# Patient Record
Sex: Female | Born: 1970 | Race: White | Hispanic: No | Marital: Married | Smoking: Former smoker
Health system: Southern US, Community
[De-identification: ages and names within clinical notes are randomized; demographics above are authoritative.]

## PROBLEM LIST (undated history)

## (undated) DIAGNOSIS — G43909 Migraine, unspecified, not intractable, without status migrainosus: Secondary | ICD-10-CM

## (undated) DIAGNOSIS — K219 Gastro-esophageal reflux disease without esophagitis: Secondary | ICD-10-CM

## (undated) DIAGNOSIS — F329 Major depressive disorder, single episode, unspecified: Secondary | ICD-10-CM

## (undated) DIAGNOSIS — L508 Other urticaria: Secondary | ICD-10-CM

## (undated) DIAGNOSIS — K589 Irritable bowel syndrome without diarrhea: Secondary | ICD-10-CM

## (undated) DIAGNOSIS — F419 Anxiety disorder, unspecified: Secondary | ICD-10-CM

## (undated) DIAGNOSIS — M797 Fibromyalgia: Secondary | ICD-10-CM

## (undated) DIAGNOSIS — K2970 Gastritis, unspecified, without bleeding: Secondary | ICD-10-CM

## (undated) DIAGNOSIS — T7840XA Allergy, unspecified, initial encounter: Secondary | ICD-10-CM

## (undated) DIAGNOSIS — F32A Depression, unspecified: Secondary | ICD-10-CM

## (undated) DIAGNOSIS — R42 Dizziness and giddiness: Secondary | ICD-10-CM

## (undated) HISTORY — DX: Dizziness and giddiness: R42

## (undated) HISTORY — PX: DILATION AND CURETTAGE OF UTERUS: SHX78

## (undated) HISTORY — DX: Migraine, unspecified, not intractable, without status migrainosus: G43.909

## (undated) HISTORY — DX: Gastritis, unspecified, without bleeding: K29.70

## (undated) HISTORY — DX: Depression, unspecified: F32.A

## (undated) HISTORY — DX: Irritable bowel syndrome, unspecified: K58.9

## (undated) HISTORY — PX: DIAGNOSTIC LAPAROSCOPY: SUR761

## (undated) HISTORY — PX: EYE SURGERY: SHX253

## (undated) HISTORY — DX: Allergy, unspecified, initial encounter: T78.40XA

## (undated) HISTORY — DX: Other urticaria: L50.8

## (undated) HISTORY — PX: WISDOM TOOTH EXTRACTION: SHX21

## (undated) HISTORY — DX: Major depressive disorder, single episode, unspecified: F32.9

---

## 1997-09-05 ENCOUNTER — Other Ambulatory Visit: Admission: RE | Admit: 1997-09-05 | Discharge: 1997-09-05 | Payer: Self-pay | Admitting: Obstetrics and Gynecology

## 1999-03-02 ENCOUNTER — Other Ambulatory Visit: Admission: RE | Admit: 1999-03-02 | Discharge: 1999-03-02 | Payer: Self-pay | Admitting: *Deleted

## 1999-10-29 ENCOUNTER — Ambulatory Visit (HOSPITAL_COMMUNITY): Admission: RE | Admit: 1999-10-29 | Discharge: 1999-10-29 | Payer: Self-pay | Admitting: Obstetrics and Gynecology

## 2000-12-05 ENCOUNTER — Other Ambulatory Visit: Admission: RE | Admit: 2000-12-05 | Discharge: 2000-12-05 | Payer: Self-pay | Admitting: Obstetrics and Gynecology

## 2004-05-06 ENCOUNTER — Ambulatory Visit (HOSPITAL_COMMUNITY): Admission: RE | Admit: 2004-05-06 | Discharge: 2004-05-06 | Payer: Self-pay | Admitting: Otolaryngology

## 2005-12-16 ENCOUNTER — Ambulatory Visit (HOSPITAL_COMMUNITY): Admission: RE | Admit: 2005-12-16 | Discharge: 2005-12-16 | Payer: Self-pay | Admitting: Family Medicine

## 2006-01-03 ENCOUNTER — Ambulatory Visit: Payer: Self-pay | Admitting: Gastroenterology

## 2006-01-03 LAB — CONVERTED CEMR LAB
Bilirubin Urine: NEGATIVE
Crystals: NEGATIVE
Folate: 18.8 ng/mL
Ketones, ur: NEGATIVE mg/dL
Mucus, UA: NEGATIVE
Nitrite: NEGATIVE
Sed Rate: 13 mm/hr (ref 0–25)
Specific Gravity, Urine: 1.015 (ref 1.000–1.03)
Total Protein, Urine: NEGATIVE mg/dL
Urine Glucose: NEGATIVE mg/dL
Urobilinogen, UA: 0.2 (ref 0.0–1.0)
Vitamin B-12: 534 pg/mL (ref 211–911)
pH: 7 (ref 5.0–8.0)

## 2006-01-06 ENCOUNTER — Ambulatory Visit: Payer: Self-pay | Admitting: Gastroenterology

## 2006-01-17 ENCOUNTER — Ambulatory Visit: Payer: Self-pay | Admitting: Gastroenterology

## 2006-02-07 HISTORY — PX: COLONOSCOPY: SHX174

## 2006-02-21 ENCOUNTER — Ambulatory Visit: Payer: Self-pay | Admitting: Gastroenterology

## 2012-06-25 ENCOUNTER — Other Ambulatory Visit: Payer: Self-pay | Admitting: Family Medicine

## 2012-06-26 NOTE — Telephone Encounter (Signed)
Ok times 2 total 

## 2012-06-28 NOTE — Addendum Note (Signed)
Addended by: Metro Kung on: 06/28/2012 06:15 PM   Modules accepted: Orders

## 2012-09-03 ENCOUNTER — Telehealth: Payer: Self-pay | Admitting: Family Medicine

## 2012-09-03 NOTE — Telephone Encounter (Signed)
Patient needs medication refill for restless legs.  States it started with PRAM...Marland KitchenMarland KitchenMarland Kitchen  Also states [facility] was to call this refill in to Doctor on Monday (July 14, 20104) of last week.  [facility] [employer]

## 2012-09-03 NOTE — Telephone Encounter (Signed)
Refill her mirapex at same dose with 6 ref

## 2012-09-04 MED ORDER — PRAMIPEXOLE DIHYDROCHLORIDE 0.25 MG PO TABS: 0.1250 mg | ORAL_TABLET | Freq: Every day | ORAL | Status: DC

## 2012-09-04 NOTE — Telephone Encounter (Signed)
[facility] phones are down and patient not sure of dose. Need directions so i can e-scribe please.

## 2012-09-04 NOTE — Telephone Encounter (Signed)
Patient was able to locate RX and give directions. Med sent electronically to [facility]. Patient notified.

## 2012-09-04 NOTE — Telephone Encounter (Signed)
Wow. Check medicine list on chart. If not clear, will have to wait until [facility] phones are working

## 2012-09-04 NOTE — Addendum Note (Signed)
Addended by: Margaretha Sheffield on: 09/04/2012 09:28 AM   Modules accepted: Orders

## 2012-09-14 ENCOUNTER — Ambulatory Visit (INDEPENDENT_AMBULATORY_CARE_PROVIDER_SITE_OTHER): Payer: BC Managed Care – PPO | Admitting: Nurse Practitioner

## 2012-09-14 ENCOUNTER — Encounter: Payer: Self-pay | Admitting: Nurse Practitioner

## 2012-09-14 VITALS — BP 106/72 | Temp 98.3°F | Ht 67.0 in | Wt 192.2 lb

## 2012-09-14 DIAGNOSIS — F418 Other specified anxiety disorders: Secondary | ICD-10-CM

## 2012-09-14 DIAGNOSIS — F341 Dysthymic disorder: Secondary | ICD-10-CM

## 2012-09-14 DIAGNOSIS — D649 Anemia, unspecified: Secondary | ICD-10-CM

## 2012-09-14 DIAGNOSIS — M2550 Pain in unspecified joint: Secondary | ICD-10-CM

## 2012-09-14 DIAGNOSIS — IMO0001 Reserved for inherently not codable concepts without codable children: Secondary | ICD-10-CM

## 2012-09-14 DIAGNOSIS — G43009 Migraine without aura, not intractable, without status migrainosus: Secondary | ICD-10-CM

## 2012-09-14 DIAGNOSIS — R5383 Other fatigue: Secondary | ICD-10-CM

## 2012-09-14 DIAGNOSIS — R5381 Other malaise: Secondary | ICD-10-CM

## 2012-09-14 MED ORDER — TRAZODONE HCL 50 MG PO TABS: ORAL_TABLET | ORAL | Status: DC

## 2012-09-15 LAB — CBC WITH DIFFERENTIAL/PLATELET
Basophils Absolute: 0 10*3/uL (ref 0.0–0.1)
Basophils Relative: 0 % (ref 0–1)
Eosinophils Absolute: 0.2 10*3/uL (ref 0.0–0.7)
Eosinophils Relative: 2 % (ref 0–5)
HCT: 43.2 % (ref 36.0–46.0)
Hemoglobin: 15.1 g/dL — ABNORMAL HIGH (ref 12.0–15.0)
Lymphocytes Relative: 21 % (ref 12–46)
Lymphs Abs: 1.8 10*3/uL (ref 0.7–4.0)
MCH: 29.5 pg (ref 26.0–34.0)
MCHC: 35 g/dL (ref 30.0–36.0)
MCV: 84.5 fL (ref 78.0–100.0)
Monocytes Absolute: 0.6 10*3/uL (ref 0.1–1.0)
Monocytes Relative: 7 % (ref 3–12)
Neutro Abs: 6 10*3/uL (ref 1.7–7.7)
Neutrophils Relative %: 70 % (ref 43–77)
Platelets: 273 10*3/uL (ref 150–400)
RBC: 5.11 MIL/uL (ref 3.87–5.11)
RDW: 13.2 % (ref 11.5–15.5)
WBC: 8.5 10*3/uL (ref 4.0–10.5)

## 2012-09-15 LAB — RHEUMATOID FACTOR: Rhuematoid fact SerPl-aCnc: <10 IU/mL (ref <=14)

## 2012-09-15 LAB — FERRITIN: Ferritin: 25 ng/mL (ref 10–291)

## 2012-09-15 LAB — SEDIMENTATION RATE: Sed Rate: 6 mm/hr (ref 0–22)

## 2012-09-17 ENCOUNTER — Encounter: Payer: Self-pay | Admitting: Nurse Practitioner

## 2012-09-17 DIAGNOSIS — G43009 Migraine without aura, not intractable, without status migrainosus: Secondary | ICD-10-CM | POA: Insufficient documentation

## 2012-09-17 DIAGNOSIS — F418 Other specified anxiety disorders: Secondary | ICD-10-CM | POA: Insufficient documentation

## 2012-09-17 LAB — B. BURGDORFI ANTIBODIES: B burgdorferi Ab IgG+IgM: 0.45 {ISR}

## 2012-09-17 LAB — ANA: Anti Nuclear Antibody(ANA): NEGATIVE

## 2012-09-17 NOTE — Assessment & Plan Note (Signed)
Continue Prozac as directed. Trazodone added to her regimen to assist with sleep.

## 2012-09-17 NOTE — Assessment & Plan Note (Signed)
Continue Relpax as directed. To improve her sleep, start trazodone 50 mg half tab by mouth daily x6 days then 1 by mouth daily.

## 2012-09-17 NOTE — Progress Notes (Signed)
Subjective:  Presents complaints of an increase in her migraine headaches over the past month. Having them 3 days of the week. Some relief with Relpax. No change in symptomatology. Worsening fatigue over the past 2 months. No fevers. Generalized achiness upper and lower body both sides. Elbow and knee pain. No erythema edema or warmth. No history of tick bites. Has been under tremendous amounts of stress. Some sleep disturbance. Emotional lability. Has a history of anemia.  Objective:   BP 106/72  Temp(Src) 98.3 F (36.8 C) (Oral)  Ht 5\' 7"  (1.702 m)  Wt 192 lb 3.2 oz (87.181 kg)  BMI 30.1 kg/m2 NAD. Alert, oriented. Mildly fatigued in appearance. Thyroid normal limit to palpation and nontender. Lungs clear. Heart regular rate rhythm. Positive tenderness with palpation of the muscles upper and lower body bilateral. Joints normal limit in appearance, no specific tenderness.  Assessment:Arthralgia - Plan: Antinuclear Antib (ANA), Rheumatoid factor, Sedimentation rate, B. burgdorfi antibodies  Myalgia and myositis - Plan: Sedimentation rate  Fatigue - Plan: CBC with Differential, Antinuclear Antib (ANA), Rheumatoid factor, Sedimentation rate, B. burgdorfi antibodies  Anemia - Plan: CBC with Differential, Ferritin  Probable fibromyalgia Insomnia  Plan: Start trazodone 50 mg a half tab by mouth each bedtime for 6 days and if needed increase to one tab by mouth each bedtime. Cautioned about potential adverse effects. Discussed importance of adequate rest nutrition and stress reduction. Further followup based on test results.

## 2012-10-06 ENCOUNTER — Encounter: Payer: Self-pay | Admitting: *Deleted

## 2012-10-11 ENCOUNTER — Ambulatory Visit (INDEPENDENT_AMBULATORY_CARE_PROVIDER_SITE_OTHER): Payer: BC Managed Care – PPO | Admitting: Nurse Practitioner

## 2012-10-11 ENCOUNTER — Encounter: Payer: Self-pay | Admitting: Nurse Practitioner

## 2012-10-11 VITALS — BP 128/78 | Ht 67.0 in | Wt 192.8 lb

## 2012-10-11 DIAGNOSIS — F418 Other specified anxiety disorders: Secondary | ICD-10-CM

## 2012-10-11 DIAGNOSIS — M797 Fibromyalgia: Secondary | ICD-10-CM

## 2012-10-11 DIAGNOSIS — F341 Dysthymic disorder: Secondary | ICD-10-CM

## 2012-10-11 DIAGNOSIS — IMO0001 Reserved for inherently not codable concepts without codable children: Secondary | ICD-10-CM

## 2012-10-11 MED ORDER — DULOXETINE HCL 30 MG PO CPEP: 30.0000 mg | ORAL_CAPSULE | Freq: Every day | ORAL | Status: DC

## 2012-10-11 MED ORDER — LORAZEPAM 1 MG PO TABS: 1.0000 mg | ORAL_TABLET | Freq: Two times a day (BID) | ORAL | Status: DC | PRN

## 2012-10-11 NOTE — Patient Instructions (Signed)
Acidophilus Vaginal probiotics Vitamin C

## 2012-10-12 ENCOUNTER — Encounter: Payer: Self-pay | Admitting: Nurse Practitioner

## 2012-10-12 DIAGNOSIS — M797 Fibromyalgia: Secondary | ICD-10-CM | POA: Insufficient documentation

## 2012-10-12 NOTE — Assessment & Plan Note (Signed)
.   DULoxetine (CYMBALTA) 30 MG capsule    Sig: Take 1 capsule (30 mg total) by mouth daily.    Dispense:  30 capsule    Refill:  2    Order Specific Question:  Supervising Provider    Answer:  LUKING, WILLIAM S [[id number]]  . LORazepam (ATIVAN) 1 MG tablet    Sig: Take 1 tablet (1 mg total) by mouth 2 (two) times daily as needed for anxiety.    Dispense:  30 tablet    Refill:  2    Order Specific Question:  Supervising Provider    Answer:  LUKING, WILLIAM S [[id number]]   Stop Prozac 20 mg. Switch to Cymbalta. Lorazepam a half to one tab by mouth twice a day when necessary. Cautioned about drowsiness. Call back if no improvement. Otherwise routine followup in 3 months. Discussed importance of stress reduction and regular activity.  

## 2012-10-12 NOTE — Assessment & Plan Note (Signed)
.   DULoxetine (CYMBALTA) 30 MG capsule    Sig: Take 1 capsule (30 mg total) by mouth daily.    Dispense:  30 capsule    Refill:  2    Order Specific Question:  Supervising Provider    Answer:  Merlyn Albert [[id number]]  . LORazepam (ATIVAN) 1 MG tablet    Sig: Take 1 tablet (1 mg total) by mouth 2 (two) times daily as needed for anxiety.    Dispense:  30 tablet    Refill:  2    Order Specific Question:  Supervising Provider    Answer:  Merlyn Albert [[id number]]   Stop Prozac 20 mg. Switch to Cymbalta. Lorazepam a half to one tab by mouth twice a day when necessary. Cautioned about drowsiness. Call back if no improvement. Otherwise routine followup in 3 months. Discussed importance of stress reduction and regular activity.

## 2012-10-12 NOTE — Progress Notes (Signed)
Subjective:  Presents for followup. Continues to have generalized achiness. Recent lab work was normal. Trazodone 25-50 mg works well for sleep but remains drowsy the next morning. No fever. Rare diazepam use only for severe vertigo. Has been under increased stress lately, has taken lorazepam without difficulty in the past, works well for her anxiety.  Objective:   BP 128/78  Ht 5\' 7"  (1.702 m)  Wt 192 lb 12.8 oz (87.454 kg)  BMI 30.19 kg/m2 NAD. Alert, oriented. Lungs clear. Heart regular rate rhythm.  Assessment: Plan: Meds ordered this encounter  Medications  . DULoxetine (CYMBALTA) 30 MG capsule    Sig: Take 1 capsule (30 mg total) by mouth daily.    Dispense:  30 capsule    Refill:  2    Order Specific Question:  Supervising Provider    Answer:  Merlyn Albert [[id number]]  . LORazepam (ATIVAN) 1 MG tablet    Sig: Take 1 tablet (1 mg total) by mouth 2 (two) times daily as needed for anxiety.    Dispense:  30 tablet    Refill:  2    Order Specific Question:  Supervising Provider    Answer:  Merlyn Albert [[id number]]   Stop Prozac 20 mg. Switch to Cymbalta. Lorazepam a half to one tab by mouth twice a day when necessary. Cautioned about drowsiness. Call back if no improvement. Otherwise routine followup in 3 months. Discussed importance of stress reduction and regular activity.

## 2012-10-26 ENCOUNTER — Other Ambulatory Visit: Payer: Self-pay | Admitting: Nurse Practitioner

## 2012-12-19 ENCOUNTER — Other Ambulatory Visit: Payer: Self-pay | Admitting: Family Medicine

## 2012-12-20 ENCOUNTER — Other Ambulatory Visit: Payer: Self-pay | Admitting: Nurse Practitioner

## 2012-12-20 ENCOUNTER — Encounter: Payer: Self-pay | Admitting: Nurse Practitioner

## 2012-12-20 ENCOUNTER — Ambulatory Visit (INDEPENDENT_AMBULATORY_CARE_PROVIDER_SITE_OTHER): Payer: BC Managed Care – PPO | Admitting: Nurse Practitioner

## 2012-12-20 VITALS — BP 108/70 | Ht 67.0 in | Wt 197.4 lb

## 2012-12-20 DIAGNOSIS — IMO0001 Reserved for inherently not codable concepts without codable children: Secondary | ICD-10-CM

## 2012-12-20 DIAGNOSIS — F341 Dysthymic disorder: Secondary | ICD-10-CM

## 2012-12-20 DIAGNOSIS — F418 Other specified anxiety disorders: Secondary | ICD-10-CM

## 2012-12-20 DIAGNOSIS — M797 Fibromyalgia: Secondary | ICD-10-CM

## 2012-12-20 MED ORDER — TERCONAZOLE 0.4 % VA CREA: 1.0000 | TOPICAL_CREAM | Freq: Every day | VAGINAL | Status: DC

## 2012-12-20 MED ORDER — DULOXETINE HCL 60 MG PO CPEP: 60.0000 mg | ORAL_CAPSULE | Freq: Every day | ORAL | Status: DC

## 2012-12-20 NOTE — Telephone Encounter (Signed)
Okay times 4 each

## 2012-12-20 NOTE — Patient Instructions (Addendum)
Light [email or web address] Seasonal affective disorder B vitamin complex with selenium Dr. Theola Sequin podiatrist

## 2012-12-24 ENCOUNTER — Encounter: Payer: Self-pay | Admitting: Nurse Practitioner

## 2012-12-24 NOTE — Progress Notes (Signed)
Subjective:  Presents for several issues. Has had history of plantar fasciitis for several months, has seen podiatrist for this. Recommend that she followup with podiatrist. Also complaints of depression over the past month and a half. Seems to be slightly worse. Fibromyalgia has improved with Cymbalta. Decreased interest in general, increased appetite with emotional eating. Some problems sleeping. Denies suicidal thoughts or ideation. There may also be some element of seasonal effective disorder.  Objective:   BP 108/70  Ht 5\' 7"  (1.702 m)  Wt 197 lb 6.4 oz (89.54 kg)  BMI 30.91 kg/m2 NAD. Alert, oriented. Thoughts logical coherent and relevant. Dressed properly. Cheerful affect. Lungs clear. Heart regular rate rhythm.  Assessment:Depression with anxiety  Fibromyalgia  Plan: Meds ordered this encounter  Medications  . DULoxetine (CYMBALTA) 60 MG capsule    Sig: Take 1 capsule (60 mg total) by mouth daily.    Dispense:  30 capsule    Refill:  2    Order Specific Question:  Supervising Provider    Answer:  Merlyn Albert [[id number]]  . terconazole (TERAZOL 7) 0.4 % vaginal cream    Sig: Place 1 applicator vaginally at bedtime. X 7 d    Dispense:  45 g    Refill:  0    Order Specific Question:  Supervising Provider    Answer:  Merlyn Albert [[id number]]   Increase Cymbalta to 60 mg daily. Call back if any problems with new dosage. Recheck in 3 months, call back sooner if no improvement in her depression. Patient to seek help immediately if any suicidal thoughts or ideation. Light therapy [email or web address] Seasonal affective disorder B vitamin complex with selenium Dr. Theola Sequin podiatrist

## 2012-12-24 NOTE — Assessment & Plan Note (Signed)
Increase Cymbalta to 60 mg daily. Call back if any problems with new dosage. Recheck in 3 months, call back sooner if no improvement in her depression. Patient to seek help immediately if any suicidal thoughts or ideation.

## 2012-12-24 NOTE — Assessment & Plan Note (Signed)
Increase Cymbalta to 60 mg daily. Call back if any problems with new dosage. Recheck in 3 months, call back sooner if no improvement in her depression. Patient to seek help immediately if any suicidal thoughts or ideation. 

## 2013-01-02 ENCOUNTER — Other Ambulatory Visit: Payer: Self-pay | Admitting: Family Medicine

## 2013-01-02 NOTE — Telephone Encounter (Signed)
There is some drug interaction between this and her antidepressant. Dr. Brett Canales is to decide if it is more beneficial than risky. Please send requests to him.

## 2013-01-04 NOTE — Telephone Encounter (Signed)
May do 5 ref can take with antidepr

## 2013-01-11 ENCOUNTER — Telehealth: Payer: Self-pay | Admitting: Family Medicine

## 2013-01-11 MED ORDER — FLUCONAZOLE 150 MG PO TABS: ORAL_TABLET | ORAL | Status: DC

## 2013-01-11 NOTE — Telephone Encounter (Signed)
difl 150 one po three d apart numb 2

## 2013-01-11 NOTE — Telephone Encounter (Signed)
Med sent to pharm. Pt notified.  

## 2013-01-11 NOTE — Telephone Encounter (Signed)
Patient needs Rx for diflucan for vaginal issues  [facility]

## 2013-01-24 ENCOUNTER — Other Ambulatory Visit: Payer: Self-pay | Admitting: Family Medicine

## 2013-02-18 ENCOUNTER — Ambulatory Visit: Payer: BC Managed Care – PPO | Admitting: Nurse Practitioner

## 2013-02-19 ENCOUNTER — Ambulatory Visit: Payer: BC Managed Care – PPO | Admitting: Family Medicine

## 2013-02-27 ENCOUNTER — Encounter: Payer: Self-pay | Admitting: Nurse Practitioner

## 2013-02-27 ENCOUNTER — Ambulatory Visit (INDEPENDENT_AMBULATORY_CARE_PROVIDER_SITE_OTHER): Payer: BC Managed Care – PPO | Admitting: Nurse Practitioner

## 2013-02-27 VITALS — BP 130/80 | Ht 67.0 in | Wt 196.6 lb

## 2013-02-27 DIAGNOSIS — N920 Excessive and frequent menstruation with regular cycle: Secondary | ICD-10-CM

## 2013-02-27 DIAGNOSIS — G43009 Migraine without aura, not intractable, without status migrainosus: Secondary | ICD-10-CM

## 2013-02-27 DIAGNOSIS — F418 Other specified anxiety disorders: Secondary | ICD-10-CM

## 2013-02-27 DIAGNOSIS — F341 Dysthymic disorder: Secondary | ICD-10-CM

## 2013-02-27 LAB — POCT HEMOGLOBIN: Hemoglobin: 14.1 g/dL (ref 12.2–16.2)

## 2013-02-27 MED ORDER — NYSTATIN 100000 UNIT/GM EX CREA: 1.0000 "application " | TOPICAL_CREAM | Freq: Two times a day (BID) | CUTANEOUS | Status: DC

## 2013-02-27 MED ORDER — GABAPENTIN 600 MG PO TABS: 600.0000 mg | ORAL_TABLET | Freq: Two times a day (BID) | ORAL | Status: DC

## 2013-02-27 NOTE — Patient Instructions (Addendum)
nexplanon mirena Endometrial ablation micronor

## 2013-02-28 ENCOUNTER — Encounter: Payer: Self-pay | Admitting: Nurse Practitioner

## 2013-02-28 NOTE — Progress Notes (Signed)
Subjective:  Presents for routine followup. Has a DMV form with her today to be filled out during office visit. The only section that needs to be filled out as far emotional/mental. Patient has been stable for a long time. 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 Continues to have frequent migraines about 3-4 times per week. Sees her gynecologist for her physicals. Menses regular for 2-3 months and then irregular lasting 7-9 days with very heavy flow the first 3-4 days. Nonsmoker. Also slight crackles reoccurring in the corners of her mouth. Has been prescribed Mycolog cream which she uses occasionally.  Objective:   BP 130/80  Ht 5\' 7"  (1.702 m)  Wt 196 lb 9.6 oz (89.177 kg)  BMI 30.78 kg/m2 NAD. Alert, oriented. Calm affect. Lungs clear. Heart regular rate rhythm. Faint fissures noted in the corners of the mouth, nonerythematous.  Assessment:Menorrhagia - Plan: POCT hemoglobin  Migraine headache without aura  Depression with anxiety  angular chelitis  Plan: Meds ordered this encounter  Medications  . nystatin cream (MYCOSTATIN)    Sig: Apply 1 application topically 2 (two) times daily.    Dispense:  30 g    Refill:  0    Order Specific Question:  Supervising Provider    Answer:  Merlyn AlbertLUKING, WILLIAM S [[id number]]  . gabapentin (NEURONTIN) 600 MG tablet    Sig: Take 1 tablet (600 mg total) by mouth 2 (two) times daily.    Dispense:  60 tablet    Refill:  5    Order Specific Question:  Supervising Provider    Answer:  Merlyn AlbertLUKING, WILLIAM S [[id number]]   switch to plain nystatin cream which does not have triamcinolone, may use as needed. Increase Neurontin dose to twice a day to see if this will help her headaches. Strongly recommended patient to consult with gynecology regarding her cycles to see if this will help with migraines. DMV form filled out while patient was in office. Recheck in 3 months, call back sooner if needed.

## 2013-02-28 NOTE — Assessment & Plan Note (Signed)
.   gabapentin (NEURONTIN) 600 MG tablet    Sig: Take 1 tablet (600 mg total) by mouth 2 (two) times daily.    Dispense:  60 tablet    Refill:  5    Order Specific Question:  Supervising Provider    Answer:  LUKING, WILLIAM S [[id number]]   switch to plain nystatin cream which does not have triamcinolone, may use as needed. Increase Neurontin dose to twice a day to see if this will help her headaches. Strongly recommended patient to consult with gynecology regarding her cycles to see if this will help with migraines. DMV form filled out while patient was in office. Recheck in 3 months, call back sooner if needed. 

## 2013-02-28 NOTE — Assessment & Plan Note (Signed)
.   gabapentin (NEURONTIN) 600 MG tablet    Sig: Take 1 tablet (600 mg total) by mouth 2 (two) times daily.    Dispense:  60 tablet    Refill:  5    Order Specific Question:  Supervising Provider    Answer:  Merlyn AlbertLUKING, WILLIAM S [[id number]]   switch to plain nystatin cream which does not have triamcinolone, may use as needed. Increase Neurontin dose to twice a day to see if this will help her headaches. Strongly recommended patient to consult with gynecology regarding her cycles to see if this will help with migraines. DMV form filled out while patient was in office. Recheck in 3 months, call back sooner if needed.

## 2013-04-08 ENCOUNTER — Other Ambulatory Visit: Payer: Self-pay | Admitting: Family Medicine

## 2013-04-15 ENCOUNTER — Telehealth: Payer: Self-pay | Admitting: Family Medicine

## 2013-04-15 NOTE — Telephone Encounter (Signed)
Pt states her migraine medication is not working, wants to know if you can change it  Its not phasing it at all even with 2 a day

## 2013-04-17 NOTE — Telephone Encounter (Signed)
3 ideas: #1. Increase Neurontin to TID  2. Add Depakote and stop Neurontin; this is an anticonvulsant similar to Topamax 3. Stop Neurontin and add beta blocker (a BP med commonly used for migraines)

## 2013-04-17 NOTE — Telephone Encounter (Signed)
Left message on voicemail to return call.

## 2013-04-17 NOTE — Addendum Note (Signed)
Addended by: Dereck LigasJOHNSON, Amirra Herling P on: 04/17/2013 04:15 PM   Modules accepted: Orders

## 2013-04-17 NOTE — Telephone Encounter (Signed)
Patient decided to go with option # 1 and increase Neurontin to TID. Will follow up if this does not help.

## 2013-04-18 NOTE — Telephone Encounter (Signed)
Noted  

## 2013-04-22 ENCOUNTER — Other Ambulatory Visit: Payer: Self-pay | Admitting: Obstetrics

## 2013-04-30 NOTE — Patient Instructions (Addendum)
   Your procedure is scheduled on: MONDAY, MAR 30  [facility] at:  8 AM Pick up the phone at the desk and dial [phone] and inform us of your arrival.  Please call this number if you have any problems the morning of surgery: [phone]  Remember: Do not eat or drink after midnight: Sunday Take these medicines the morning of surgery with a SIP OF WATER: None.  Patient wants to take meds when she gets home from surgery.  Do not wear jewelry, make-up, or FINGER nail polish No metal in your hair or on your body. Do not wear lotions, powders, perfumes.  You may wear deodorant.  Do not bring valuables to the hospital. Contacts, dentures or bridgework may not be worn into surgery.    Patients discharged on the day of surgery will not be allowed to drive home.  Home with husband Tasia CatchingsCraig cell [phone].

## 2013-05-01 ENCOUNTER — Encounter (INDEPENDENT_AMBULATORY_CARE_PROVIDER_SITE_OTHER): Payer: Self-pay

## 2013-05-01 ENCOUNTER — Encounter (HOSPITAL_COMMUNITY)
Admission: RE | Admit: 2013-05-01 | Discharge: 2013-05-01 | Disposition: A | Discharge status: Home / Self Care | Payer: BC Managed Care – PPO | Source: Ambulatory Visit | Attending: Obstetrics | Admitting: Obstetrics

## 2013-05-01 ENCOUNTER — Encounter (HOSPITAL_COMMUNITY): Payer: Self-pay

## 2013-05-01 ENCOUNTER — Encounter (HOSPITAL_COMMUNITY): Payer: Self-pay | Admitting: Pharmacist

## 2013-05-01 DIAGNOSIS — Z01812 Encounter for preprocedural laboratory examination: Secondary | ICD-10-CM | POA: Insufficient documentation

## 2013-05-01 HISTORY — DX: Gastro-esophageal reflux disease without esophagitis: K21.9

## 2013-05-01 HISTORY — DX: Anxiety disorder, unspecified: F41.9

## 2013-05-01 HISTORY — DX: Fibromyalgia: M79.7

## 2013-05-01 LAB — CBC
HCT: 40.3 % (ref 36.0–46.0)
Hemoglobin: 13.8 g/dL (ref 12.0–15.0)
MCH: 30.4 pg (ref 26.0–34.0)
MCHC: 34.2 g/dL (ref 30.0–36.0)
MCV: 88.8 fL (ref 78.0–100.0)
Platelets: 227 10*3/uL (ref 150–400)
RBC: 4.54 MIL/uL (ref 3.87–5.11)
RDW: 12.9 % (ref 11.5–15.5)
WBC: 9.8 10*3/uL (ref 4.0–10.5)

## 2013-05-06 ENCOUNTER — Encounter (HOSPITAL_COMMUNITY)
Admission: RE | Disposition: A | Discharge status: Home / Self Care | Payer: Self-pay | Source: Ambulatory Visit | Attending: Obstetrics

## 2013-05-06 ENCOUNTER — Ambulatory Visit (HOSPITAL_COMMUNITY)
Admission: RE | Admit: 2013-05-06 | Discharge: 2013-05-06 | Disposition: A | Discharge status: Home / Self Care | Payer: BC Managed Care – PPO | Source: Ambulatory Visit | Attending: Obstetrics | Admitting: Obstetrics

## 2013-05-06 ENCOUNTER — Ambulatory Visit (HOSPITAL_COMMUNITY): Payer: BC Managed Care – PPO | Admitting: Anesthesiology

## 2013-05-06 ENCOUNTER — Encounter (HOSPITAL_COMMUNITY): Payer: BC Managed Care – PPO | Admitting: Anesthesiology

## 2013-05-06 DIAGNOSIS — K589 Irritable bowel syndrome without diarrhea: Secondary | ICD-10-CM | POA: Insufficient documentation

## 2013-05-06 DIAGNOSIS — Z87891 Personal history of nicotine dependence: Secondary | ICD-10-CM | POA: Insufficient documentation

## 2013-05-06 DIAGNOSIS — Z302 Encounter for sterilization: Secondary | ICD-10-CM | POA: Insufficient documentation

## 2013-05-06 DIAGNOSIS — N838 Other noninflammatory disorders of ovary, fallopian tube and broad ligament: Secondary | ICD-10-CM | POA: Insufficient documentation

## 2013-05-06 DIAGNOSIS — R42 Dizziness and giddiness: Secondary | ICD-10-CM | POA: Insufficient documentation

## 2013-05-06 DIAGNOSIS — F411 Generalized anxiety disorder: Secondary | ICD-10-CM | POA: Insufficient documentation

## 2013-05-06 DIAGNOSIS — F429 Obsessive-compulsive disorder, unspecified: Secondary | ICD-10-CM | POA: Insufficient documentation

## 2013-05-06 DIAGNOSIS — K219 Gastro-esophageal reflux disease without esophagitis: Secondary | ICD-10-CM | POA: Insufficient documentation

## 2013-05-06 DIAGNOSIS — F3289 Other specified depressive episodes: Secondary | ICD-10-CM | POA: Insufficient documentation

## 2013-05-06 DIAGNOSIS — F329 Major depressive disorder, single episode, unspecified: Secondary | ICD-10-CM | POA: Insufficient documentation

## 2013-05-06 DIAGNOSIS — N920 Excessive and frequent menstruation with regular cycle: Secondary | ICD-10-CM | POA: Insufficient documentation

## 2013-05-06 DIAGNOSIS — IMO0001 Reserved for inherently not codable concepts without codable children: Secondary | ICD-10-CM | POA: Insufficient documentation

## 2013-05-06 DIAGNOSIS — N840 Polyp of corpus uteri: Secondary | ICD-10-CM | POA: Insufficient documentation

## 2013-05-06 DIAGNOSIS — N943 Premenstrual tension syndrome: Secondary | ICD-10-CM | POA: Insufficient documentation

## 2013-05-06 HISTORY — PX: DILITATION & CURRETTAGE/HYSTROSCOPY WITH NOVASURE ABLATION: SHX5568

## 2013-05-06 HISTORY — PX: LAPAROSCOPIC BILATERAL SALPINGECTOMY: SHX5889

## 2013-05-06 LAB — PREGNANCY, URINE: Preg Test, Ur: NEGATIVE

## 2013-05-06 SURGERY — DILATATION & CURETTAGE/HYSTEROSCOPY WITH NOVASURE ABLATION
Anesthesia: General | Site: Vagina

## 2013-05-06 MED ORDER — GLYCOPYRROLATE 0.2 MG/ML IJ SOLN
INTRAMUSCULAR | Status: AC
  Filled 2013-05-06: qty 3

## 2013-05-06 MED ORDER — SODIUM CHLORIDE 0.9 % IJ SOLN
INTRAMUSCULAR | Status: AC
  Filled 2013-05-06: qty 10

## 2013-05-06 MED ORDER — FENTANYL CITRATE 0.05 MG/ML IJ SOLN
INTRAMUSCULAR | Status: AC
  Filled 2013-05-06: qty 5

## 2013-05-06 MED ORDER — ROCURONIUM BROMIDE 100 MG/10ML IV SOLN
INTRAVENOUS | Status: DC | PRN
  Administered 2013-05-06: 30 mg via INTRAVENOUS
  Administered 2013-05-06: 10 mg via INTRAVENOUS

## 2013-05-06 MED ORDER — NEOSTIGMINE METHYLSULFATE 1 MG/ML IJ SOLN
INTRAMUSCULAR | Status: AC
  Filled 2013-05-06: qty 1

## 2013-05-06 MED ORDER — PROMETHAZINE HCL 25 MG/ML IJ SOLN: 6.2500 mg | INTRAMUSCULAR | Status: DC | PRN

## 2013-05-06 MED ORDER — FENTANYL CITRATE 0.05 MG/ML IJ SOLN
25.0000 ug | INTRAMUSCULAR | Status: DC | PRN
  Administered 2013-05-06 (×3): 50 ug via INTRAVENOUS

## 2013-05-06 MED ORDER — LIDOCAINE HCL (CARDIAC) 20 MG/ML IV SOLN
INTRAVENOUS | Status: AC
  Filled 2013-05-06: qty 5

## 2013-05-06 MED ORDER — METOCLOPRAMIDE HCL 5 MG/ML IJ SOLN
10.0000 mg | Freq: Once | INTRAMUSCULAR | Status: AC
  Administered 2013-05-06: 10 mg via INTRAVENOUS

## 2013-05-06 MED ORDER — BUPIVACAINE HCL (PF) 0.25 % IJ SOLN
INTRAMUSCULAR | Status: DC | PRN
  Administered 2013-05-06: 10 mL

## 2013-05-06 MED ORDER — PROPOFOL 10 MG/ML IV BOLUS
INTRAVENOUS | Status: DC | PRN
  Administered 2013-05-06: 200 mg via INTRAVENOUS

## 2013-05-06 MED ORDER — FENTANYL CITRATE 0.05 MG/ML IJ SOLN
INTRAMUSCULAR | Status: AC
  Filled 2013-05-06: qty 2

## 2013-05-06 MED ORDER — METOCLOPRAMIDE HCL 5 MG/ML IJ SOLN
INTRAMUSCULAR | Status: AC
  Filled 2013-05-06: qty 2

## 2013-05-06 MED ORDER — LIDOCAINE HCL (CARDIAC) 20 MG/ML IV SOLN
INTRAVENOUS | Status: DC | PRN
  Administered 2013-05-06: 100 mg via INTRAVENOUS

## 2013-05-06 MED ORDER — LACTATED RINGERS IR SOLN
Status: DC | PRN
  Administered 2013-05-06: 3000 mL

## 2013-05-06 MED ORDER — KETOROLAC TROMETHAMINE 30 MG/ML IJ SOLN
INTRAMUSCULAR | Status: DC | PRN
  Administered 2013-05-06: 30 mg via INTRAVENOUS

## 2013-05-06 MED ORDER — SODIUM CHLORIDE 0.9 % IJ SOLN
INTRAMUSCULAR | Status: DC | PRN
  Administered 2013-05-06: 10 mL

## 2013-05-06 MED ORDER — PROPOFOL 10 MG/ML IV EMUL
INTRAVENOUS | Status: AC
  Filled 2013-05-06: qty 20

## 2013-05-06 MED ORDER — ONDANSETRON HCL 4 MG/2ML IJ SOLN
INTRAMUSCULAR | Status: AC
  Filled 2013-05-06: qty 2

## 2013-05-06 MED ORDER — FENTANYL CITRATE 0.05 MG/ML IJ SOLN
INTRAMUSCULAR | Status: AC
  Administered 2013-05-06: 50 ug via INTRAVENOUS
  Filled 2013-05-06: qty 2

## 2013-05-06 MED ORDER — FENTANYL CITRATE 0.05 MG/ML IJ SOLN
INTRAMUSCULAR | Status: DC | PRN
  Administered 2013-05-06 (×2): 100 ug via INTRAVENOUS
  Administered 2013-05-06: 50 ug via INTRAVENOUS

## 2013-05-06 MED ORDER — MIDAZOLAM HCL 2 MG/2ML IJ SOLN
INTRAMUSCULAR | Status: AC
  Filled 2013-05-06: qty 2

## 2013-05-06 MED ORDER — OXYCODONE-ACETAMINOPHEN 5-325 MG PO TABS: 2.0000 | ORAL_TABLET | ORAL | Status: DC | PRN

## 2013-05-06 MED ORDER — LACTATED RINGERS IV SOLN
INTRAVENOUS | Status: DC
  Administered 2013-05-06 (×4): via INTRAVENOUS

## 2013-05-06 MED ORDER — SILVER NITRATE-POT NITRATE 75-25 % EX MISC
CUTANEOUS | Status: AC
  Filled 2013-05-06: qty 1

## 2013-05-06 MED ORDER — ROCURONIUM BROMIDE 100 MG/10ML IV SOLN
INTRAVENOUS | Status: AC
  Filled 2013-05-06: qty 1

## 2013-05-06 MED ORDER — PROMETHAZINE HCL 25 MG/ML IJ SOLN
INTRAMUSCULAR | Status: AC
  Filled 2013-05-06: qty 1

## 2013-05-06 MED ORDER — BUPIVACAINE HCL (PF) 0.25 % IJ SOLN
INTRAMUSCULAR | Status: AC
  Filled 2013-05-06: qty 30

## 2013-05-06 MED ORDER — DEXAMETHASONE SODIUM PHOSPHATE 10 MG/ML IJ SOLN
INTRAMUSCULAR | Status: DC | PRN
  Administered 2013-05-06: 10 mg via INTRAVENOUS

## 2013-05-06 MED ORDER — DEXAMETHASONE SODIUM PHOSPHATE 10 MG/ML IJ SOLN
INTRAMUSCULAR | Status: AC
  Filled 2013-05-06: qty 1

## 2013-05-06 MED ORDER — GLYCOPYRROLATE 0.2 MG/ML IJ SOLN
INTRAMUSCULAR | Status: DC | PRN
  Administered 2013-05-06: 0.6 mg via INTRAVENOUS

## 2013-05-06 MED ORDER — LIDOCAINE-EPINEPHRINE (PF) 2 %-1:200000 IJ SOLN
INTRAMUSCULAR | Status: AC
  Filled 2013-05-06: qty 20

## 2013-05-06 MED ORDER — MIDAZOLAM HCL 2 MG/2ML IJ SOLN
INTRAMUSCULAR | Status: DC | PRN
  Administered 2013-05-06: 2 mg via INTRAVENOUS

## 2013-05-06 MED ORDER — NEOSTIGMINE METHYLSULFATE 1 MG/ML IJ SOLN
INTRAMUSCULAR | Status: DC | PRN
  Administered 2013-05-06: 4 mg via INTRAVENOUS

## 2013-05-06 SURGICAL SUPPLY — 46 items
ABLATOR ENDOMETRIAL BIPOLAR (ABLATOR) ×3 IMPLANT
BLADE SURG 15 STRL LF C SS BP (BLADE) ×2 IMPLANT
BLADE SURG 15 STRL SS (BLADE) ×1
CABLE HIGH FREQUENCY MONO STRZ (ELECTRODE) IMPLANT
CANISTER SUCT 3000ML (MISCELLANEOUS) ×3 IMPLANT
CATH ROBINSON RED A/P 16FR (CATHETERS) ×3 IMPLANT
CHLORAPREP W/TINT 26ML (MISCELLANEOUS) ×3 IMPLANT
CLOTH BEACON ORANGE TIMEOUT ST (SAFETY) ×3 IMPLANT
CONTAINER PREFILL 10% NBF 60ML (FORM) ×6 IMPLANT
DERMABOND ADVANCED (GAUZE/BANDAGES/DRESSINGS)
DERMABOND ADVANCED .7 DNX12 (GAUZE/BANDAGES/DRESSINGS) IMPLANT
DRAPE HYSTEROSCOPY (DRAPE) ×3 IMPLANT
DRSG TELFA 3X8 NADH (GAUZE/BANDAGES/DRESSINGS) ×3 IMPLANT
ELECT REM PT RETURN 9FT ADLT (ELECTROSURGICAL) ×3
ELECTRODE REM PT RTRN 9FT ADLT (ELECTROSURGICAL) ×2 IMPLANT
FILTER SMOKE EVAC LAPAROSHD (FILTER) ×3 IMPLANT
FORCEPS CUTTING 33CM 5MM (CUTTING FORCEPS) ×3 IMPLANT
GLOVE BIO SURGEON STRL SZ 6.5 (GLOVE) ×9 IMPLANT
GLOVE BIOGEL PI IND STRL 7.0 (GLOVE) ×4 IMPLANT
GLOVE BIOGEL PI INDICATOR 7.0 (GLOVE) ×2
GOWN STRL REUS W/TWL LRG LVL3 (GOWN DISPOSABLE) ×9 IMPLANT
LOOP ANGLED CUTTING 22FR (CUTTING LOOP) IMPLANT
MANIPULATOR UTERINE 4.5 ZUMI (MISCELLANEOUS) ×3 IMPLANT
NEEDLE INSUFFLATION 120MM (ENDOMECHANICALS) ×3 IMPLANT
NEEDLE SPNL 22GX3.5 QUINCKE BK (NEEDLE) ×3 IMPLANT
PACK LAPAROSCOPY BASIN (CUSTOM PROCEDURE TRAY) ×3 IMPLANT
PACK VAGINAL MINOR WOMEN LF (CUSTOM PROCEDURE TRAY) ×3 IMPLANT
PAD OB MATERNITY 4.3X12.25 (PERSONAL CARE ITEMS) ×3 IMPLANT
POUCH SPECIMEN RETRIEVAL 10MM (ENDOMECHANICALS) IMPLANT
PROTECTOR NERVE ULNAR (MISCELLANEOUS) ×6 IMPLANT
SCISSORS LAP 5X35 DISP (ENDOMECHANICALS) IMPLANT
SET IRRIG TUBING LAPAROSCOPIC (IRRIGATION / IRRIGATOR) IMPLANT
SET TUBING HYSTEROSCOPY 2 NDL (TUBING) ×3 IMPLANT
SOLUTION ELECTROLUBE (MISCELLANEOUS) ×3 IMPLANT
SUT VICRYL 0 UR6 27IN ABS (SUTURE) IMPLANT
SUT VICRYL 4-0 PS2 18IN ABS (SUTURE) IMPLANT
SYR 50ML LL SCALE MARK (SYRINGE) IMPLANT
SYR 5ML LL (SYRINGE) ×3 IMPLANT
SYR CONTROL 10ML LL (SYRINGE) ×3 IMPLANT
TOWEL OR 17X24 6PK STRL BLUE (TOWEL DISPOSABLE) ×6 IMPLANT
TRAY FOLEY CATH 14FR (SET/KITS/TRAYS/PACK) ×3 IMPLANT
TROCAR XCEL NON-BLD 11X100MML (ENDOMECHANICALS) ×3 IMPLANT
TROCAR XCEL NON-BLD 5MMX100MML (ENDOMECHANICALS) ×9 IMPLANT
TUBE HYSTEROSCOPY W Y-CONNECT (TUBING) ×3 IMPLANT
WARMER LAPAROSCOPE (MISCELLANEOUS) ×3 IMPLANT
WATER STERILE IRR 1000ML POUR (IV SOLUTION) IMPLANT

## 2013-05-06 NOTE — Progress Notes (Signed)
Patient in restroom getting dressed to go home.  C/o dizziness and began vomiting while in restroom.  Stated she suffers from vertigo and this feels very similar.  Assisted back to stretcher.  Dr Malen GauzeFoster notified and order received for Phenergan 6.25 mg IV.

## 2013-05-06 NOTE — Brief Op Note (Signed)
05/06/2013  11:37 AM  PATIENT:  Sheri GalasJennifer L Wood  [age] y.o. female  PRE-OPERATIVE DIAGNOSIS:  Menorrhagia, desires sterilization  POST-OPERATIVE DIAGNOSIS:  Menorrhagia, desires sterilization  PROCEDURE:  Procedure(s): DILATATION & CURETTAGE/HYSTEROSCOPY WITH NOVASURE ABLATION (N/A) LAPAROSCOPIC BILATERAL SALPINGECTOMY (Bilateral)  SURGEON:  Surgeon(s) and Role:    Tresa Endo* Tyja Gortney A. Ernestina PennaFogleman, MD - Primary  PHYSICIAN ASSISTANT:   ASSISTANTS: Fredric MareBailey, CNM   ANESTHESIA:   local and general  EBL:  Total I/O In: 1300 [I.V.:1300] Out: 100 [Urine:100]  BLOOD ADMINISTERED:none  DRAINS: none   LOCAL MEDICATIONS USED:  MARCAINE    and Amount: 1/4%, 10 cc local to port sites  SPECIMEN:  Source of Specimen:  endometrial curretings  DISPOSITION OF SPECIMEN:  PATHOLOGY  COUNTS:  YES  TOURNIQUET:  * No tourniquets in log *  DICTATION: .Note written in EPIC  PLAN OF CARE: Discharge to home after PACU  PATIENT DISPOSITION:  PACU - hemodynamically stable.   Delay start of Pharmacological VTE agent (>24hrs) due to surgical blood loss or risk of bleeding: yes

## 2013-05-06 NOTE — Anesthesia Postprocedure Evaluation (Signed)
  Anesthesia Post-op Note  Patient: Sheri Wood  Procedure(s) Performed: Procedure(s): DILATATION & CURETTAGE/HYSTEROSCOPY WITH NOVASURE ABLATION (N/A) LAPAROSCOPIC BILATERAL SALPINGECTOMY (Bilateral) Patient is awake and responsive. Pain and nausea are reasonably well controlled. Vital signs are stable and clinically acceptable. Oxygen saturation is clinically acceptable. There are no apparent anesthetic complications at this time. Patient is ready for discharge.

## 2013-05-06 NOTE — Discharge Instructions (Signed)
DISCHARGE INSTRUCTIONS: HYSTEROSCOPY / NOVASURE ABLATION / BILATERAL SALPINGECTOMIES The following instructions have been prepared to help you care for yourself upon your return home.  **You may take Ibuprofen/Motrin/Aleve/Advil starting at 5:00 pm today**  Personal hygiene:  Use sanitary pads for vaginal drainage, not tampons.  Shower the day after your procedure.  NO tub baths, pools or Jacuzzis for 2-3 weeks.  Wipe front to back after using the bathroom.  Activity and limitations:  Do NOT drive or operate any equipment for 24 hours. The effects of anesthesia are still present and drowsiness may result.  Do NOT rest in bed all day.  Walking is encouraged.  Walk up and down stairs slowly.  You may resume your normal activity in one to two days or as indicated by your physician. Sexual activity: NO intercourse for at least 2 weeks after the procedure, or as indicated by your Doctor.  Diet: Eat a light meal as desired this evening. You may resume your usual diet tomorrow.  Return to Work: You may resume your work activities in one to two days or as indicated by Therapist, sportsyour Doctor.  What to expect after your surgery: Expect to have vaginal bleeding/discharge for 2-3 days and spotting for up to 10 days. It is not unusual to have soreness for up to 1-2 weeks. You may have a slight burning sensation when you urinate for the first day. Mild cramps may continue for a couple of days. You may have a regular period in 2-6 weeks.  Call your doctor for any of the following:  Excessive vaginal bleeding or clotting, saturating and changing one pad every hour.  Inability to urinate 6 hours after discharge from hospital.  Pain not relieved by pain medication.  Fever of 100.4 F or greater.  Unusual vaginal discharge or odor.  Return to office _________________Call for an appointment ___________________  Patients signature: ______________________  Nurses signature  ________________________  Support person's signature__________________________

## 2013-05-06 NOTE — Op Note (Signed)
05/06/2013  11:37 AM  PATIENT:  Sheri Wood  43 y.o. female  PRE-OPERATIVE DIAGNOSIS:  Menorrhagia, desires sterilization  POST-OPERATIVE DIAGNOSIS:  Menorrhagia, desires sterilization  PROCEDURE:  Procedure(s): DILATATION & CURETTAGE/HYSTEROSCOPY WITH NOVASURE ABLATION (N/A) LAPAROSCOPIC BILATERAL SALPINGECTOMY (Bilateral)  SURGEON:  Surgeon(s) and Role:    Tresa Endo A. Ernestina Penna, MD - Primary  PHYSICIAN ASSISTANT:   ASSISTANTS: Fredric Mare, CNM   ANESTHESIA:   local and general  EBL:  Total I/O In: 1300 [I.V.:1300] Out: 100 [Urine:100]  BLOOD ADMINISTERED:none  DRAINS: none   LOCAL MEDICATIONS USED:  MARCAINE    and Amount: 1/4%, 10 cc local to port sites  SPECIMEN:  Source of Specimen:  endometrial curretings  DISPOSITION OF SPECIMEN:  PATHOLOGY  COUNTS:  YES  TOURNIQUET:  * No tourniquets in log *  DICTATION: .Note written in EPIC  PLAN OF CARE: Discharge to home after PACU  PATIENT DISPOSITION:  PACU - hemodynamically stable.   Delay start of Pharmacological VTE agent (>24hrs) due to surgical blood loss or risk of bleeding: yes  Antibiotics: none Complications: none  INDICATIONS: Bothersome menorrhagia, in need of contraception, failed OCs, declines IUD.  Risks of procedure discussed with patient including bleeding, infection, damage to surrounding organs.     FINDINGS:  Nl liver edge, nl gallbladder, small bowel adhesions right upper abdomen, small spot endometrios R pelvic wall at level of round ligament, nl b/l ovaries, nl tubes, nl uterus, b/l ostia visualized, no endometrial masses, good burn post-ablation   TECHNIQUE:  The patient was taken to the operating room where general anesthesia was obtained without difficulty.  She was then placed in the dorsal lithotomy position and prepared and draped in sterile fashion.  After an adequate timeout was performed, a foley catheter was placed and  a bimanual exam was done to assess the size and position of the  uterus. A bivalved speculum was then placed in the patient's vagina, and the anterior lip of cervix grasped with the single-tooth tenaculum.  The cervix was serially dilated with Hegar dilators and measurements were taken for the ablation. The Zumi uterine manipulator was then advanced into the uterus.  The speculum was removed from the vagina.  Attention was then turned to the patient's abdomen where a 5-mm skin incision was made on the umbilical fold after first injecting with 1/4 % marcaine.  The Veress needle was carefully introduced into the peritoneal cavity through the abdominal wall.  Intraperitoneal placement was confirmed with a saline filled syringe and by drop in intraabdominal pressure with insufflation of carbon dioxide gas.  Adequate pneumoperitoneum was obtained, and the 5-mm non-bladed trocar and sleeve were then advanced without difficulty into the abdomen where intraabdominal placement was confirmed by the operative laparoscope. A survey of the patient's pelvis and abdomen revealed findings as above.  5 mm skin incisions were made in the left and right lower quadrant after injecting with anasthetic. Non-bladed trocars were placed under direct visualization. Using blunt and grasping instruments, anatomy was surveyed with findings as above.  The right tube was placed on stretch, using the gyrus, the right tube was serially cauterized then transected. As the tube was not able to be removed through the 5mm port, it was placed in the anterior cul-de-sac. An identical procedure was carried out on the left side. The 5mm camera was then placed through the LLQ port, the umbilical incision was extended and a non-bladed 10mm trocar wsa advanced under direct visualization. The tubes were removed. A 60 cc syringe  was used to irrigate then suction out the fluid. Excellent hemostasis was noted.   Pneumoperitoneum removed, sites still hemostatic. Instruments and ports removed.   Umbilical fascial  incision closed with 0-vicryl, single suture. 4-0 vicryl used for skin incisions. Dermabond applied by nurse.   Zumi removed, Catheter removed.   Tenaculum placed back on cervix after sterile speculum inserted. Hysteroscopy was done with findings as above. Novasure measurement already taken. Sharp curretage done. Novasure introduced, passed cavity assessment after using a tenaculum to seal the external os. With a cavity length of 6, width of 3 and a power of 99, 1min 5sec ablation time carried out. Repeat hysteroscopy showed good ablative effect. All instruments removed.   The patient tolerated the procedure well.  Sponge, lap, and needle counts were correct times two.  The patient was then taken to the recovery room awake, extubated and  in stable condition.  Jaicion Laurie A. 01/22/2013 12:19 PM

## 2013-05-06 NOTE — Transfer of Care (Signed)
Immediate Anesthesia Transfer of Care Note  Patient: Sheri GalasJennifer L Wood  Procedure(s) Performed: Procedure(s): DILATATION & CURETTAGE/HYSTEROSCOPY WITH NOVASURE ABLATION (N/A) LAPAROSCOPIC BILATERAL SALPINGECTOMY (Bilateral)  Patient Location: PACU  Anesthesia Type:General  Level of Consciousness: awake, alert  and oriented  Airway & Oxygen Therapy: Patient Spontanous Breathing and Patient connected to nasal cannula oxygen  Post-op Assessment: Report given to PACU RN and Post -op Vital signs reviewed and stable  Post vital signs: Reviewed and stable  Complications: No apparent anesthesia complications

## 2013-05-06 NOTE — H&P (Signed)
See scanned docs for full history.  In short: 43 yo G2P2 with h/o bothersome menorrhagia and undesired fertility. Declines IUD, continuous spotting on OCs. 2/15 nl embx. 1/13, nl pap. utuers 8x6x4 cm.   PMH: depression, OCD, PMDD, endometriosis  All: PCN, Sulfa  Meds: none  Past Medical History  Diagnosis Date  . Vertigo   . Depression   . Urticaria, chronic   . IBS (irritable bowel syndrome)   . Allergy     seasonal  . Gastritis     history  . Anxiety   . SVD (spontaneous vaginal delivery)     x 2  . GERD (gastroesophageal reflux disease)     otc med prn  . Migraine   . Fibromyalgia     PE: Filed Vitals:   05/06/13 0818  BP: 109/67  Pulse: 74  Temp: 97.5 F (36.4 C)  TempSrc: Oral  Resp: 18  SpO2: 100%   Gen: well, no distress Abd: soft, NT LE: NT, no edema GU: def to OR  CBC    Component Value Date/Time   WBC 9.8 05/01/2013 1540   RBC 4.54 05/01/2013 1540   HGB 13.8 05/01/2013 1540   HGB 14.1 02/27/2013 1609   HCT 40.3 05/01/2013 1540   PLT 227 05/01/2013 1540   MCV 88.8 05/01/2013 1540   MCH 30.4 05/01/2013 1540   MCHC 34.2 05/01/2013 1540   RDW 12.9 05/01/2013 1540   LYMPHSABS 1.8 09/15/2012 0940   MONOABS 0.6 09/15/2012 0940   EOSABS 0.2 09/15/2012 0940   BASOSABS 0.0 09/15/2012 0940      A/P: Novasure endometrial ablation after D&C, hysteroscopy, lap b/l salpingectomy.  Semaj Kham A. 05/06/2013 9:38 AM

## 2013-05-06 NOTE — Anesthesia Preprocedure Evaluation (Signed)
Anesthesia Evaluation  Patient identified by MRN, date of birth, ID band Patient awake    Reviewed: Allergy & Precautions, H&P , Patient's Chart, lab work & pertinent test results, reviewed documented beta blocker date and time   History of Anesthesia Complications (+) PONV  Airway Mallampati: II TM Distance: >3 FB Neck ROM: full    Dental no notable dental hx.    Pulmonary former smoker,  breath sounds clear to auscultation  Pulmonary exam normal       Cardiovascular Rhythm:regular Rate:Normal     Neuro/Psych    GI/Hepatic   Endo/Other    Renal/GU      Musculoskeletal   Abdominal   Peds  Hematology   Anesthesia Other Findings   Reproductive/Obstetrics                           Anesthesia Physical Anesthesia Plan  ASA: II  Anesthesia Plan: General   Post-op Pain Management:    Induction: Intravenous  Airway Management Planned: Oral ETT  Additional Equipment:   Intra-op Plan:   Post-operative Plan: Extubation in OR  Informed Consent: I have reviewed the patients History and Physical, chart, labs and discussed the procedure including the risks, benefits and alternatives for the proposed anesthesia with the patient or authorized representative who has indicated his/her understanding and acceptance.   Dental Advisory Given and Dental advisory given  Plan Discussed with: CRNA and Surgeon  Anesthesia Plan Comments: (  Discussed general anesthesia, including possible nausea, instrumentation of airway, sore throat,pulmonary aspiration, etc. I asked if the were any outstanding questions, or  concerns before we proceeded. )        Anesthesia Quick Evaluation

## 2013-05-07 ENCOUNTER — Encounter (HOSPITAL_COMMUNITY): Payer: Self-pay | Admitting: Obstetrics

## 2013-06-06 ENCOUNTER — Ambulatory Visit (INDEPENDENT_AMBULATORY_CARE_PROVIDER_SITE_OTHER): Payer: BC Managed Care – PPO | Admitting: Nurse Practitioner

## 2013-06-06 VITALS — BP 130/78 | Ht 67.0 in | Wt 199.6 lb

## 2013-06-06 DIAGNOSIS — F418 Other specified anxiety disorders: Secondary | ICD-10-CM

## 2013-06-06 DIAGNOSIS — F341 Dysthymic disorder: Secondary | ICD-10-CM

## 2013-06-06 DIAGNOSIS — G43009 Migraine without aura, not intractable, without status migrainosus: Secondary | ICD-10-CM

## 2013-06-06 MED ORDER — TOPIRAMATE 50 MG PO TABS: 50.0000 mg | ORAL_TABLET | Freq: Two times a day (BID) | ORAL | Status: DC

## 2013-06-08 ENCOUNTER — Encounter: Payer: Self-pay | Admitting: Nurse Practitioner

## 2013-06-08 DIAGNOSIS — G2581 Restless legs syndrome: Secondary | ICD-10-CM | POA: Insufficient documentation

## 2013-06-08 NOTE — Progress Notes (Signed)
Subjective:  Presents for recheck. Increase in anxiety and migraines. Has been off Topamax, this was working well before. Has had bilat salpingectomy and endometrial ablation.   Objective:   BP 130/78  Ht 5\' 7"  (1.702 m)  Wt 199 lb 9.6 oz (90.538 kg)  BMI 31.25 kg/m2 NAD. Alert, oriented. Lungs clear. Heart RRR.   Assessment:  Problem List Items Addressed This Visit     Cardiovascular and Mediastinum   Migraine headache without aura - Primary   Relevant Medications      topiramate (TOPAMAX) tablet     Other   Depression with anxiety (Chronic)      Plan:  Meds ordered this encounter  Medications  . topiramate (TOPAMAX) 50 MG tablet    Sig: Take 1 tablet (50 mg total) by mouth 2 (two) times daily.    Dispense:  60 tablet    Refill:  2    Order Specific Question:  Supervising Provider    Answer:  Merlyn AlbertLUKING, WILLIAM S [[id number]  Return in about 3 months (around 09/05/2013). Continue other medications as directed. Will see if topamax helps with migraines and anxiety. Patient to contact office in 2 weeks if no improvement.

## 2013-06-18 ENCOUNTER — Encounter: Payer: Self-pay | Admitting: Family Medicine

## 2013-06-18 NOTE — Telephone Encounter (Addendum)
PER PATIENT:   No....my head just feels weird sometimes. I can't really explain that part of it. Do you think it's serious, I mean could I have a stroke? I would like to continue with it if you think it's safe to. But at the same time I don't want to risk anything either.

## 2013-06-26 ENCOUNTER — Other Ambulatory Visit: Payer: Self-pay | Admitting: Nurse Practitioner

## 2013-06-28 ENCOUNTER — Other Ambulatory Visit: Payer: Self-pay | Admitting: Nurse Practitioner

## 2013-06-28 MED ORDER — TERCONAZOLE 0.4 % VA CREA: 1.0000 | TOPICAL_CREAM | Freq: Every day | VAGINAL | Status: DC

## 2013-07-02 ENCOUNTER — Telehealth: Payer: Self-pay | Admitting: Family Medicine

## 2013-07-02 MED ORDER — DULOXETINE HCL 30 MG PO CPEP: ORAL_CAPSULE | ORAL | Status: DC

## 2013-07-02 NOTE — Telephone Encounter (Signed)
30 mg daily for three wks, then every other day for two weeks then stop . Numb 30. If depr and anxiety worsens significantly, rec ov to dis alternatives

## 2013-07-02 NOTE — Telephone Encounter (Signed)
Patient would like to wheen herself off of her cymbalta because he insurance is not covering it anymore. She would like to taper down. Please advise.

## 2013-07-04 NOTE — Telephone Encounter (Signed)
Discussed with patient. Patient verbalized understanding. 

## 2013-07-08 ENCOUNTER — Encounter: Payer: Self-pay | Admitting: Nurse Practitioner

## 2013-07-10 ENCOUNTER — Other Ambulatory Visit: Payer: Self-pay | Admitting: Nurse Practitioner

## 2013-07-10 MED ORDER — BUPROPION HCL ER (SR) 150 MG PO TB12: 150.0000 mg | ORAL_TABLET | Freq: Two times a day (BID) | ORAL | Status: DC

## 2013-07-17 ENCOUNTER — Other Ambulatory Visit: Payer: Self-pay | Admitting: Family Medicine

## 2013-07-17 NOTE — Telephone Encounter (Signed)
May refill this +3 additional 

## 2013-08-06 ENCOUNTER — Other Ambulatory Visit: Payer: Self-pay | Admitting: Nurse Practitioner

## 2013-08-06 NOTE — Telephone Encounter (Signed)
Last seen 4/30

## 2013-08-14 ENCOUNTER — Encounter: Payer: Self-pay | Admitting: Nurse Practitioner

## 2013-08-14 ENCOUNTER — Ambulatory Visit (INDEPENDENT_AMBULATORY_CARE_PROVIDER_SITE_OTHER): Payer: BC Managed Care – PPO | Admitting: Nurse Practitioner

## 2013-08-14 VITALS — BP 112/72 | Ht 67.0 in | Wt 197.0 lb

## 2013-08-14 DIAGNOSIS — IMO0001 Reserved for inherently not codable concepts without codable children: Secondary | ICD-10-CM

## 2013-08-14 DIAGNOSIS — F341 Dysthymic disorder: Secondary | ICD-10-CM

## 2013-08-14 DIAGNOSIS — F418 Other specified anxiety disorders: Secondary | ICD-10-CM

## 2013-08-14 DIAGNOSIS — M797 Fibromyalgia: Secondary | ICD-10-CM

## 2013-08-14 MED ORDER — IMIPRAMINE HCL 25 MG PO TABS: ORAL_TABLET | ORAL | Status: DC

## 2013-08-14 NOTE — Patient Instructions (Addendum)
L Tryptophan as directed at bedtime Increase Imipramine dose by 25 mg every 3-4 d until max dose of 150 mg

## 2013-08-19 ENCOUNTER — Encounter: Payer: Self-pay | Admitting: Nurse Practitioner

## 2013-08-19 NOTE — Progress Notes (Signed)
Subjective:  Presents to discuss her depression and anxiety. Has a chronic history of this. Has been on multiple medications which will work for while and slowly her symptoms will come back. Also side effects on many medications. Stop her bupropion about a week ago due to severe vertigo which has resolved. Denies suicidal thoughts or ideation.  Objective:   BP 112/72  Ht 5\' 7"  (1.702 m)  Wt 197 lb (89.359 kg)  BMI 30.85 kg/m2 NAD. Alert, oriented. Thoughts logical coherent and relevant. Dressed appropriately. Lungs clear. Heart regular rate rhythm.   Assessment: Problem List Items Addressed This Visit     Musculoskeletal and Integument   Fibromyalgia     Other   Depression with anxiety - Primary (Chronic)     Plan: Meds ordered this encounter  Medications  . DISCONTD: DULoxetine (CYMBALTA) 30 MG capsule    Sig:   . DISCONTD: topiramate (TOPAMAX) 50 MG tablet    Sig:   . imipramine (TOFRANIL) 25 MG tablet    Sig: One po qhs x 3 d then 2 po qd    Dispense:  60 tablet    Refill:  0    Order Specific Question:  Supervising Provider    Answer:  Merlyn AlbertLUKING, WILLIAM S [[id number]   Slowly titrate imipramine dose up to 150 mg. Refer to Dr. Tenny Crawoss for an evaluation and medication management. Patient to seek help immediately if any suicidal or homicidal thoughts or ideation. DC imipramine and call if any significant problems.

## 2013-09-05 ENCOUNTER — Ambulatory Visit: Payer: BC Managed Care – PPO | Admitting: Nurse Practitioner

## 2013-09-17 ENCOUNTER — Telehealth: Payer: Self-pay | Admitting: Family Medicine

## 2013-09-17 MED ORDER — ETODOLAC 400 MG PO TABS: 400.0000 mg | ORAL_TABLET | Freq: Two times a day (BID) | ORAL | Status: DC | PRN

## 2013-09-17 NOTE — Addendum Note (Signed)
Addended by: Dereck LigasJOHNSON, Jasdeep Dejarnett P on: 09/17/2013 03:35 PM   Modules accepted: Orders

## 2013-09-17 NOTE — Telephone Encounter (Signed)
May add etodolac 400 mg one up to bid with food prn pain, numb 24

## 2013-09-17 NOTE — Telephone Encounter (Signed)
Patient needs another type of medication called in for her migraines at [facility] [place].

## 2013-09-17 NOTE — Telephone Encounter (Signed)
Patient states she has already took the max dose of Relpax she can take for her migraines and her head still hurts. Patient wants to know can something be prescribed in addition to the Relpax.

## 2013-09-17 NOTE — Telephone Encounter (Signed)
Left message on voice mail that medication has been sent to pharmacy.

## 2013-09-20 ENCOUNTER — Ambulatory Visit (HOSPITAL_COMMUNITY): Payer: Self-pay | Admitting: Psychiatry

## 2013-09-30 ENCOUNTER — Other Ambulatory Visit: Payer: Self-pay | Admitting: Nurse Practitioner

## 2013-10-01 ENCOUNTER — Telehealth: Payer: Self-pay | Admitting: Family Medicine

## 2013-10-01 NOTE — Telephone Encounter (Signed)
Ok 6 ref 

## 2013-10-01 NOTE — Telephone Encounter (Signed)
Ok plus 5 ref 

## 2013-10-01 NOTE — Telephone Encounter (Signed)
Patient said that she started taking her pramipexole (MIRAPEX) 0.25 MG tablet 2 times daily, so she needs the dosage increased to .5 mg instead and send to [facility].

## 2013-10-01 NOTE — Telephone Encounter (Signed)
[facility] to clarify how pt is taking med.  On med list it states 1/2 at bedtime.

## 2013-10-03 ENCOUNTER — Ambulatory Visit (INDEPENDENT_AMBULATORY_CARE_PROVIDER_SITE_OTHER): Payer: BC Managed Care – PPO | Admitting: Psychiatry

## 2013-10-03 ENCOUNTER — Encounter (HOSPITAL_COMMUNITY): Payer: Self-pay | Admitting: Psychiatry

## 2013-10-03 VITALS — BP 110/70 | HR 85 | Ht 67.0 in | Wt 193.8 lb

## 2013-10-03 DIAGNOSIS — F3289 Other specified depressive episodes: Secondary | ICD-10-CM

## 2013-10-03 DIAGNOSIS — F411 Generalized anxiety disorder: Secondary | ICD-10-CM

## 2013-10-03 DIAGNOSIS — F329 Major depressive disorder, single episode, unspecified: Secondary | ICD-10-CM

## 2013-10-03 DIAGNOSIS — F429 Obsessive-compulsive disorder, unspecified: Secondary | ICD-10-CM

## 2013-10-03 DIAGNOSIS — F32A Depression, unspecified: Secondary | ICD-10-CM

## 2013-10-03 MED ORDER — DICLOFENAC SODIUM 75 MG PO TBEC: 75.0000 mg | DELAYED_RELEASE_TABLET | Freq: Two times a day (BID) | ORAL | Status: DC | PRN

## 2013-10-03 MED ORDER — VILAZODONE HCL 20 MG PO TABS: 20.0000 mg | ORAL_TABLET | Freq: Every day | ORAL | Status: DC

## 2013-10-03 MED ORDER — PRAMIPEXOLE DIHYDROCHLORIDE 0.5 MG PO TABS: 0.5000 mg | ORAL_TABLET | Freq: Every day | ORAL | Status: DC

## 2013-10-03 MED ORDER — ALPRAZOLAM 1 MG PO TABS: 1.0000 mg | ORAL_TABLET | Freq: Three times a day (TID) | ORAL | Status: DC

## 2013-10-03 NOTE — Telephone Encounter (Signed)
voltaren 75 one bid prn

## 2013-10-03 NOTE — Telephone Encounter (Signed)
Mirapex sent to pharmacy. Patient states that the Etodolac that was recently prescribed for her migraines is causing bad vertigo. Can something else be prescribed?

## 2013-10-03 NOTE — Telephone Encounter (Signed)
Left message on voicemail notifying patient that medication was sent to pharmacy.  

## 2013-10-03 NOTE — Progress Notes (Signed)
Psychiatric Assessment Adult  Patient Identification:  Sheri Wood Date of Evaluation:  10/03/2013 Chief Complaint: "I'm nervous and depressed and I can't function at work" History of Chief Complaint:   Chief Complaint  Patient presents with  . Anxiety  . Depression  . Follow-up  . Establish Care    Anxiety Symptoms include nervous/anxious behavior.     this patient is a 43 year old married white female who lives with her husband 41 year old daughter, 74-month-old grandson and 46 year old son in [place]. Her son is actually in college at [employer]. 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  The patient was referred by Dr. Gerda Diss, her primary physician for further assessment and treatment of depression and anxiety.  The patient states she's had difficulties with depression and anxiety beginning about 18 years ago. She's always been a somewhat obsessional worried person he has a child. In childhood she would do rituals like counting and touching things in a certain way. She got older and had her own children she began to worry constantly about their safety and about germs.  When she was about 43 years old she started having panic attacks and depression. She had seen her primary doctor as well as Dr. Jennelle Human and Dr. Duard Brady, both psychiatrist in [place]. She's been tried on numerous antidepressants including Zoloft, Celexa, Effexor, Prozac, Wellbutrin and amitriptyline. We will work for a while and then stop. Since getting on these medicines she's also gained 60 pounds. Most recently she was tried on imipramine. When she got up to the 50 mg dosage she was unable to sleep. She also takes lorazepam which helps some with anxiety but she only takes it as needed. She's never had a psychiatric hospitalization. She's had individual and couples counseling but it has been several years ago.  The patient states that it current symptoms are exacerbated by work  stress. She has a great deal of data to compile at her job and feels overwhelmed. She often has meltdowns and begins crying at work. At home her daughter has moved in with her baby after her husband left her. The patient and her husband are feeling this financial strain of caring for them as well. The patient states that she has crying spells and feels depressed she eats too much and she is age is. She has never been suicidal or engaged in self-harm. She does not use drugs or alcohol. Her symptoms of depression irritability are much worse around the time of her menses. She suffers from migraine headaches which seem hormonally related as well. She also has fibromyalgia and restless leg syndrome and overall has little in her due to do much of anything Review of Systems  Constitutional: Positive for fatigue.  Eyes: Negative.   Respiratory: Negative.   Cardiovascular: Negative.   Gastrointestinal: Negative.   Endocrine: Negative.   Genitourinary: Negative.   Musculoskeletal: Positive for arthralgias, myalgias and neck pain.  Skin: Negative.   Allergic/Immunologic: Negative.   Neurological: Positive for syncope and headaches.  Hematological: Negative.   Psychiatric/Behavioral: Positive for sleep disturbance and dysphoric mood. The patient is nervous/anxious.    Physical Exam  Depressive Symptoms: depressed mood, anhedonia, psychomotor agitation, fatigue, difficulty concentrating, impaired memory, anxiety, panic attacks, disturbed sleep, weight gain,  (Hypo) Manic Symptoms:   Elevated Mood:  No Irritable Mood:  Yes Grandiosity:  No Distractibility:  No Labiality of Mood:  Yes Delusions:  No Hallucinations:  No Impulsivity:  No Sexually Inappropriate Behavior:  No Financial  Extravagance:  No Flight of Ideas:  No  Anxiety Symptoms: Excessive Worry:  Yes Panic Symptoms:  Yes Agoraphobia:  No Obsessive Compulsive: Yes  Symptoms: Handwashing, Obsessive worrying, checking doors  and windows Specific Phobias:  No Social Anxiety:  No  Psychotic Symptoms:  Hallucinations: No None Delusions:  No Paranoia:  No   Ideas of Reference:  No  PTSD Symptoms: Ever had a traumatic exposure:  No Had a traumatic exposure in the last month:  No Re-experiencing: No None Hypervigilance:  No Hyperarousal: No None Avoidance: No None  Traumatic Brain Injury: No   Past Psychiatric History: Diagnosis: Maj. depression, generalized anxiety disorder, OCD   Hospitalizations: none  Outpatient Care: Has seen 2 psychiatrists in [place] the past   Substance Abuse Care: none  Self-Mutilation: none  Suicidal Attempts: none  Violent Behaviors: none   Past Medical History:   Past Medical History  Diagnosis Date  . Vertigo   . Depression   . Urticaria, chronic   . IBS (irritable bowel syndrome)   . Allergy     seasonal  . Gastritis     history  . Anxiety   . SVD (spontaneous vaginal delivery)     x 2  . GERD (gastroesophageal reflux disease)     otc med prn  . Migraine   . Fibromyalgia    History of Loss of Consciousness:  No Seizure History:  No Cardiac History:  No Allergies:   Allergies  Allergen Reactions  . Cefzil [Cefprozil]     possible hives  . Bee Venom Swelling  . Sulfa Antibiotics Other (See Comments)    Abdominal cramping.   Current Medications:  Current Outpatient Prescriptions  Medication Sig Dispense Refill  . diazepam (VALIUM) 5 MG tablet TAKE 1 TABLET BY MOUTH EVERY 4 HOURS AS NEEDED FOR VERTIGO  40 tablet  3  . Evening Primrose Oil 1000 MG CAPS Take 1 capsule by mouth 3 (three) times daily as needed. During Menstrual Cycle      . imipramine (TOFRANIL) 25 MG tablet       . loratadine (CLARITIN) 10 MG tablet Take 10 mg by mouth daily.      . meclizine (ANTIVERT) 25 MG tablet TAKE 1 TABLET BY MOUTH FOUR TIMES DAILY AS NEEDED  60 tablet  4  . Multiple Vitamin (MULTIVITAMIN WITH MINERALS) TABS tablet Take 1 tablet by mouth daily.      .  pramipexole (MIRAPEX) 0.5 MG tablet Take 1 tablet (0.5 mg total) by mouth at bedtime.  30 tablet  5  . RELPAX 40 MG tablet TAKE 1 TABLET BY MOUTH AT ONSET OF MIGRAINE, MAY REPEAT ONCE IN 2 HOURS IF NEEDED- MAX DOSE OF 2 TABLETS IN 24 HOURS  8 tablet  5  . ALPRAZolam (XANAX) 1 MG tablet Take 1 tablet (1 mg total) by mouth 3 (three) times daily.  90 tablet  2  . gabapentin (NEURONTIN) 600 MG tablet Take 600 mg by mouth 2 (two) times daily.       . Vilazodone HCl (VIIBRYD) 20 MG TABS Take 1 tablet (20 mg total) by mouth daily.  30 tablet  2   No current facility-administered medications for this visit.    Previous Psychotropic Medications:  Medication Dose   Zoloft, Celexa, Effexor, Prozac, Provigil, Wellbutrin, amitriptyline, imipramine, lorazepam                        Substance Abuse History in the last 12 months:  Substance Age of 1st Use Last Use Amount Specific Type  Nicotine      Alcohol      Cannabis      Opiates      Cocaine      Methamphetamines      LSD      Ecstasy      Benzodiazepines      Caffeine      Inhalants      Others:                          Medical Consequences of Substance Abuse: none  Legal Consequences of Substance Abuse: none  Family Consequences of Substance Abuse: none  Blackouts:  No DT's:  No Withdrawal Symptoms:  No None  Social History: Current Place of Residence: [place] [address] of Birth: [place] Family Members: Parents 1 brother, another brother deceased Marital Status:  Married Children:   Sons: 1  Daughters: 1 Relationships:  Education:  Corporate treasurer Problems/Performance: none Religious Beliefs/Practices: Christian History of Abuse: none Armed forces technical officer; Geophysicist/field seismologist, Chief Executive Officer History:  None. Legal History: none Hobbies/Interests: Reading, furniture refinishing  Family History:   Family History  Problem Relation Age of Onset  . Depression  Mother   . Anxiety disorder Mother   . Anxiety disorder Brother   . Depression Brother   . Bipolar disorder Maternal Uncle   . Anxiety disorder Paternal Grandfather   . Depression Paternal Grandfather   . Bipolar disorder Cousin   . Alcohol abuse Brother   . Drug abuse Brother     Mental Status Examination/Evaluation: Objective:  Appearance: Neat and Well Groomed  Eye Contact::  Good  Speech:  Clear and Coherent  Volume:  Normal  Mood:  Anxious mildly depressed and tearful   Affect:  Constricted, Depressed and Tearful  Thought Process:  Goal Directed  Orientation:  Full (Time, Place, and Person)  Thought Content:  Obsessions and Rumination  Suicidal Thoughts:  No  Homicidal Thoughts:  No  Judgement:  Good  Insight:  Good  Psychomotor Activity:  Normal  Akathisia:  No  Handed:  Right  AIMS (if indicated):    Assets:  Communication Skills Desire for Improvement Financial Resources/Insurance Social Support Vocational/Educational    Laboratory/X-Ray Psychological Evaluation(s)   Reviewed in the chart      Assessment:  Axis I: Depressive Disorder NOS, Generalized Anxiety Disorder and Obsessive Compulsive Disorder  AXIS I Depressive Disorder NOS, Generalized Anxiety Disorder and Obsessive Compulsive Disorder  AXIS II Deferred  AXIS III Past Medical History  Diagnosis Date  . Vertigo   . Depression   . Urticaria, chronic   . IBS (irritable bowel syndrome)   . Allergy     seasonal  . Gastritis     history  . Anxiety   . SVD (spontaneous vaginal delivery)     x 2  . GERD (gastroesophageal reflux disease)     otc med prn  . Migraine   . Fibromyalgia      AXIS IV other psychosocial or environmental problems  AXIS V 51-60 moderate symptoms   Treatment Plan/Recommendations:  Plan of Care: Medication management   Laboratory:    Psychotherapy: This was recommended but she declined at this time   Medications: She'll discontinue Ativan and start alprazolam 1 mg 3  times a day on a scheduled basis. She's been given a Viibryd dose pack and will and a 20 mg daily and  then pick up a prescription for it   Routine PRN Medications:  No  Consultations:   Safety Concerns:  She denies thoughts of self-harm   Other:  She'll return in 4 weeks     Diannia Ruder, MD 8/27/20153:21 PM

## 2013-10-24 ENCOUNTER — Encounter (HOSPITAL_COMMUNITY): Payer: Self-pay | Admitting: Psychiatry

## 2013-10-24 ENCOUNTER — Ambulatory Visit (INDEPENDENT_AMBULATORY_CARE_PROVIDER_SITE_OTHER): Payer: BC Managed Care – PPO | Admitting: Psychiatry

## 2013-10-24 VITALS — BP 118/63 | HR 74 | Ht 67.0 in | Wt 195.0 lb

## 2013-10-24 DIAGNOSIS — F32A Depression, unspecified: Secondary | ICD-10-CM

## 2013-10-24 DIAGNOSIS — F411 Generalized anxiety disorder: Secondary | ICD-10-CM

## 2013-10-24 DIAGNOSIS — F3289 Other specified depressive episodes: Secondary | ICD-10-CM

## 2013-10-24 DIAGNOSIS — F913 Oppositional defiant disorder: Secondary | ICD-10-CM

## 2013-10-24 DIAGNOSIS — F329 Major depressive disorder, single episode, unspecified: Secondary | ICD-10-CM

## 2013-10-24 NOTE — Progress Notes (Signed)
Patient ID: Sheri Wood, female   DOB: [birth year], 43 y.o.   MRN: [id number]  Psychiatric Assessment Adult  Patient Identification:  Sheri Wood Date of Evaluation:  10/24/2013 Chief Complaint: "I'm doing better" History of Chief Complaint:   Chief Complaint  Patient presents with  . Anxiety  . Depression  . Follow-up    Anxiety Symptoms include nervous/anxious behavior.     this patient is a 43 year old married white female who lives with her husband 34 year old daughter, 17-month-old grandson and 66 year old son in [place]. Her son is actually in college at [employer] state. 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  The patient was referred by Dr. Gerda Diss, her primary physician for further assessment and treatment of depression and anxiety.  The patient states she's had difficulties with depression and anxiety beginning about 18 years ago. She's always been a somewhat obsessional worried person he has a child. In childhood she would do rituals like counting and touching things in a certain way. She got older and had her own children she began to worry constantly about their safety and about germs.  When she was about 44 years old she started having panic attacks and depression. She had seen her primary doctor as well as Dr. Jennelle Human and Dr. Duard Brady, both psychiatrist in [place]. She's been tried on numerous antidepressants including Zoloft, Celexa, Effexor, Prozac, Wellbutrin and amitriptyline. We will work for a while and then stop. Since getting on these medicines she's also gained 60 pounds. Most recently she was tried on imipramine. When she got up to the 50 mg dosage she was unable to sleep. She also takes lorazepam which helps some with anxiety but she only takes it as needed. She's never had a psychiatric hospitalization. She's had individual and couples counseling but it has been several years ago.  The patient states that it current symptoms  are exacerbated by work stress. She has a great deal of data to compile at her job and feels overwhelmed. She often has meltdowns and begins crying at work. At home her daughter has moved in with her baby after her husband left her. The patient and her husband are feeling this financial strain of caring for them as well. The patient states that she has crying spells and feels depressed she eats too much and she is age is. She has never been suicidal or engaged in self-harm. She does not use drugs or alcohol. Her symptoms of depression irritability are much worse around the time of her menses. She suffers from migraine headaches which seem hormonally related as well. She also has fibromyalgia and restless leg syndrome and overall has little energy to do much of anything  The patient returns after 4 weeks. She is feeling somewhat better. 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 She still has the same struggles at home. She is now in Viibryd 20 mg daily but it is causing bloating and diarrhea. I suggested she go back down to the 10 mg for about 2 weeks and then try to bring it back up again. She does feel like it's helped her mood and anxiety. The Xanax is helping as well. She denies any recent panic attacks and she is sleeping well. Review of Systems  Constitutional: Positive for fatigue.  Eyes: Negative.   Respiratory: Negative.   Cardiovascular: Negative.   Gastrointestinal: Negative.   Endocrine: Negative.   Genitourinary: Negative.   Musculoskeletal: Positive for arthralgias,  myalgias and neck pain.  Skin: Negative.   Allergic/Immunologic: Negative.   Neurological: Positive for syncope and headaches.  Hematological: Negative.   Psychiatric/Behavioral: Positive for sleep disturbance and dysphoric mood. The patient is nervous/anxious.    Physical Exam  Depressive Symptoms: depressed mood, anhedonia, psychomotor agitation, fatigue, difficulty  concentrating, impaired memory, anxiety, panic attacks, disturbed sleep, weight gain,  (Hypo) Manic Symptoms:   Elevated Mood:  No Irritable Mood:  Yes Grandiosity:  No Distractibility:  No Labiality of Mood:  Yes Delusions:  No Hallucinations:  No Impulsivity:  No Sexually Inappropriate Behavior:  No Financial Extravagance:  No Flight of Ideas:  No  Anxiety Symptoms: Excessive Worry:  Yes Panic Symptoms:  Yes Agoraphobia:  No Obsessive Compulsive: Yes  Symptoms: Handwashing, Obsessive worrying, checking doors and windows Specific Phobias:  No Social Anxiety:  No  Psychotic Symptoms:  Hallucinations: No None Delusions:  No Paranoia:  No   Ideas of Reference:  No  PTSD Symptoms: Ever had a traumatic exposure:  No Had a traumatic exposure in the last month:  No Re-experiencing: No None Hypervigilance:  No Hyperarousal: No None Avoidance: No None  Traumatic Brain Injury: No   Past Psychiatric History: Diagnosis: Maj. depression, generalized anxiety disorder, OCD   Hospitalizations: none  Outpatient Care: Has seen 2 psychiatrists in [place] the past   Substance Abuse Care: none  Self-Mutilation: none  Suicidal Attempts: none  Violent Behaviors: none   Past Medical History:   Past Medical History  Diagnosis Date  . Vertigo   . Depression   . Urticaria, chronic   . IBS (irritable bowel syndrome)   . Allergy     seasonal  . Gastritis     history  . Anxiety   . SVD (spontaneous vaginal delivery)     x 2  . GERD (gastroesophageal reflux disease)     otc med prn  . Migraine   . Fibromyalgia    History of Loss of Consciousness:  No Seizure History:  No Cardiac History:  No Allergies:   Allergies  Allergen Reactions  . Cefzil [Cefprozil]     possible hives  . Bee Venom Swelling  . Sulfa Antibiotics Other (See Comments)    Abdominal cramping.   Current Medications:  Current Outpatient Prescriptions  Medication Sig Dispense Refill  .  ALPRAZolam (XANAX) 1 MG tablet Take 1 tablet (1 mg total) by mouth 3 (three) times daily.  90 tablet  2  . Evening Primrose Oil 1000 MG CAPS Take 1 capsule by mouth 3 (three) times daily as needed. During Menstrual Cycle      . gabapentin (NEURONTIN) 600 MG tablet Take 600 mg by mouth 2 (two) times daily.       [name] loratadine (CLARITIN) 10 MG tablet Take 10 mg by mouth daily.      . meclizine (ANTIVERT) 25 MG tablet TAKE 1 TABLET BY MOUTH FOUR TIMES DAILY AS NEEDED  60 tablet  4  . Multiple Vitamin (MULTIVITAMIN WITH MINERALS) TABS tablet Take 1 tablet by mouth daily.      . pramipexole (MIRAPEX) 0.5 MG tablet Take 1 tablet (0.5 mg total) by mouth at bedtime.  30 tablet  5  . RELPAX 40 MG tablet TAKE 1 TABLET BY MOUTH AT ONSET OF MIGRAINE, MAY REPEAT ONCE IN 2 HOURS IF NEEDED- MAX DOSE OF 2 TABLETS IN 24 HOURS  8 tablet  5  . Vilazodone HCl (VIIBRYD) 20 MG TABS Take 1 tablet (20 mg total) by mouth daily.  30 tablet  2   No current facility-administered medications for this visit.    Previous Psychotropic Medications:  Medication Dose   Zoloft, Celexa, Effexor, Prozac, Provigil, Wellbutrin, amitriptyline, imipramine, lorazepam                        Substance Abuse History in the last 12 months: Substance Age of 1st Use Last Use Amount Specific Type  Nicotine      Alcohol      Cannabis      Opiates      Cocaine      Methamphetamines      LSD      Ecstasy      Benzodiazepines      Caffeine      Inhalants      Others:                          Medical Consequences of Substance Abuse: none  Legal Consequences of Substance Abuse: none  Family Consequences of Substance Abuse: none  Blackouts:  No DT's:  No Withdrawal Symptoms:  No None  Social History: Current Place of Residence: [place] [address] of Birth: [place] Family Members: Parents 1 brother, another brother deceased Marital Status:  Married Children:   Sons: 1  Daughters:  1 Relationships:  Education:  Corporate treasurer Problems/Performance: none Religious Beliefs/Practices: Christian History of Abuse: none Armed forces technical officer; Geophysicist/field seismologist, Chief Executive Officer History:  None. Legal History: none Hobbies/Interests: Reading, furniture refinishing  Family History:   Family History  Problem Relation Age of Onset  . Depression Mother   . Anxiety disorder Mother   . Anxiety disorder Brother   . Depression Brother   . Bipolar disorder Maternal Uncle   . Anxiety disorder Paternal Grandfather   . Depression Paternal Grandfather   . Bipolar disorder Cousin   . Alcohol abuse Brother   . Drug abuse Brother     Mental Status Examination/Evaluation: Objective:  Appearance: Neat and Well Groomed  Eye Contact::  Good  Speech:  Clear and Coherent  Volume:  Normal  Mood:  Improved much less depressed   Affect: Congruent   Thought Process:  Goal Directed  Orientation:  Full (Time, Place, and Person)  Thought Content: Within normal limit   Suicidal Thoughts:  No  Homicidal Thoughts:  No  Judgement:  Good  Insight:  Good  Psychomotor Activity:  Normal  Akathisia:  No  Handed:  Right  AIMS (if indicated):    Assets:  Communication Skills Desire for Improvement Financial Resources/Insurance Social Support Vocational/Educational    Laboratory/X-Ray Psychological Evaluation(s)   Reviewed in the chart      Assessment:  Axis I: Depressive Disorder NOS, Generalized Anxiety Disorder and Obsessive Compulsive Disorder  AXIS I Depressive Disorder NOS, Generalized Anxiety Disorder and Obsessive Compulsive Disorder  AXIS II Deferred  AXIS III Past Medical History  Diagnosis Date  . Vertigo   . Depression   . Urticaria, chronic   . IBS (irritable bowel syndrome)   . Allergy     seasonal  . Gastritis     history  . Anxiety   . SVD (spontaneous vaginal delivery)     x 2  . GERD (gastroesophageal reflux disease)     otc med prn  .  Migraine   . Fibromyalgia      AXIS IV other psychosocial or environmental problems  AXIS V 51-60 moderate symptoms  Treatment Plan/Recommendations:  Plan of Care: Medication management   Laboratory:    Psychotherapy: This was recommended but she declined at this time   Medications: She'll continue alprazolam 1 mg 3 times a day on a scheduled basis. She will decrease Viibryd 10 mg daily for 2 weeks and then go back up to 20 mg daily. I reminded her to always take it with food   Routine PRN Medications:  No  Consultations:   Safety Concerns:  She denies thoughts of self-harm   Other:  She'll return in 4 weeks     Diannia Ruder, MD 9/17/20153:49 PM

## 2013-10-31 ENCOUNTER — Telehealth (HOSPITAL_COMMUNITY): Payer: Self-pay | Admitting: *Deleted

## 2013-10-31 ENCOUNTER — Other Ambulatory Visit: Payer: Self-pay | Admitting: Family Medicine

## 2013-10-31 NOTE — Telephone Encounter (Signed)
Pt calling stating that her Viibryd is not working. Pt states she is having side effects and can not handle it anymore. Side effects are Migraines worst, irritable upset stomach and bloated and would like to know what other medications she can try. Pt started taking Viibryd since the 14th of August. [phone]. Pt

## 2013-11-01 ENCOUNTER — Other Ambulatory Visit (HOSPITAL_COMMUNITY): Payer: Self-pay | Admitting: Psychiatry

## 2013-11-01 MED ORDER — BUPROPION HCL ER (XL) 150 MG PO TB24: 150.0000 mg | ORAL_TABLET | ORAL | Status: DC

## 2013-11-08 ENCOUNTER — Other Ambulatory Visit: Payer: Self-pay | Admitting: Nurse Practitioner

## 2013-11-14 ENCOUNTER — Telehealth (HOSPITAL_COMMUNITY): Payer: Self-pay | Admitting: *Deleted

## 2013-11-18 ENCOUNTER — Other Ambulatory Visit: Payer: Self-pay | Admitting: *Deleted

## 2013-11-18 ENCOUNTER — Telehealth: Payer: Self-pay | Admitting: *Deleted

## 2013-11-18 MED ORDER — PREDNISONE 20 MG PO TABS: ORAL_TABLET | ORAL | Status: DC

## 2013-11-18 NOTE — Telephone Encounter (Signed)
Ok adult pred taper 

## 2013-11-18 NOTE — Telephone Encounter (Signed)
Pt states she has been seen multiple times for hives and she usually gets prednisone called in. She states she has been feeling the hives coming back for the past 2 weeks. Her palms, feet, and scalp are itching. No trouble breathing. Wants to know if she can have prednisone called in since next available is Friday.  [facility]. Pt will be at work til 3:30 work number [phone] after 3:30 can be reached at [phone].

## 2013-11-18 NOTE — Telephone Encounter (Signed)
Med sent to pharm. Pt notified on voicemail.  

## 2013-11-28 ENCOUNTER — Ambulatory Visit (HOSPITAL_COMMUNITY): Payer: Self-pay | Admitting: Psychiatry

## 2013-11-29 ENCOUNTER — Telehealth: Payer: Self-pay | Admitting: *Deleted

## 2013-11-29 DIAGNOSIS — L509 Urticaria, unspecified: Secondary | ICD-10-CM

## 2013-11-29 MED ORDER — PREDNISONE 20 MG PO TABS: ORAL_TABLET | ORAL | Status: DC

## 2013-11-29 NOTE — Telephone Encounter (Signed)
Ok, pred

## 2013-11-29 NOTE — Telephone Encounter (Signed)
Patient says she gets hives on and off throughout her life. She said she has been seeing you for years about this.   She was prescribed pred taper on 10/12. She finished the pred, and she still has the itchiness. She said they aren't "hives" yet, but her scalp, feet, and palms are itching.  She has also been dealing with a lot of stress, so that is not helping either.   She cannot come in today, and would like to know what else can be done.

## 2013-11-29 NOTE — Telephone Encounter (Signed)
Pt needs to see a dermatologist in [place], next step for puzzling hives, if pt states she's done that, only other choice is allergy spec. If she's done that, rec go back to one of these two.

## 2013-11-29 NOTE — Addendum Note (Signed)
Addended byOneal Deputy: Stefana Lodico D on: 11/29/2013 03:52 PM   Modules accepted: Orders

## 2013-11-29 NOTE — Telephone Encounter (Signed)
Prednisone sent, referral in, pt notified and verbalized understanding.

## 2013-11-29 NOTE — Addendum Note (Signed)
Addended byOneal Deputy: Teliah Buffalo D on: 11/29/2013 03:34 PM   Modules accepted: Orders

## 2013-11-29 NOTE — Telephone Encounter (Signed)
I put in a referral for dermatologist. Patient would like something to hold her off until she can get an appt with the dermatologist.   She said she would do another round of prednisone if that is the only thing that you would recommend.

## 2013-12-13 ENCOUNTER — Ambulatory Visit (HOSPITAL_COMMUNITY): Payer: Self-pay | Admitting: Psychiatry

## 2013-12-17 ENCOUNTER — Other Ambulatory Visit: Payer: Self-pay | Admitting: Family Medicine

## 2013-12-17 ENCOUNTER — Telehealth: Payer: Self-pay | Admitting: Family Medicine

## 2013-12-17 NOTE — Telephone Encounter (Signed)
Patient thought that she had called a couple of weeks ago and changed her pramipexole (MIRAPEX) 0.25 MG tablet to taking a whole pill each day instead of a half.  She said that the half a pill daily does not work for her anymore for her restless legs.  Wants to know if this can be changed to a whole pill each day.  [facility]

## 2013-12-17 NOTE — Telephone Encounter (Signed)
Spoke with patient and told her that she should still have refills left on the .5mg  Meripex. Pt verbalized understanding.

## 2013-12-18 ENCOUNTER — Encounter: Payer: Self-pay | Admitting: Family Medicine

## 2014-01-06 ENCOUNTER — Other Ambulatory Visit: Payer: Self-pay | Admitting: Family Medicine

## 2014-01-09 ENCOUNTER — Ambulatory Visit (HOSPITAL_COMMUNITY): Payer: Self-pay | Admitting: Psychiatry

## 2014-02-13 ENCOUNTER — Ambulatory Visit (INDEPENDENT_AMBULATORY_CARE_PROVIDER_SITE_OTHER): Payer: BC Managed Care – PPO | Admitting: Psychiatry

## 2014-02-13 ENCOUNTER — Encounter (HOSPITAL_COMMUNITY): Payer: Self-pay | Admitting: Psychiatry

## 2014-02-13 VITALS — BP 120/62 | HR 86 | Ht 67.0 in | Wt 197.0 lb

## 2014-02-13 DIAGNOSIS — F32A Depression, unspecified: Secondary | ICD-10-CM

## 2014-02-13 DIAGNOSIS — F42 Obsessive-compulsive disorder: Secondary | ICD-10-CM

## 2014-02-13 DIAGNOSIS — F329 Major depressive disorder, single episode, unspecified: Secondary | ICD-10-CM

## 2014-02-13 DIAGNOSIS — F411 Generalized anxiety disorder: Secondary | ICD-10-CM

## 2014-02-13 MED ORDER — ALPRAZOLAM 1 MG PO TABS: 1.0000 mg | ORAL_TABLET | Freq: Every day | ORAL | Status: DC | PRN

## 2014-02-13 MED ORDER — BUPROPION HCL ER (XL) 150 MG PO TB24: 150.0000 mg | ORAL_TABLET | ORAL | Status: DC

## 2014-02-13 NOTE — Progress Notes (Signed)
Patient ID: Sheri Wood, female   DOB: March 23, 1970, 44 y.o.   MRN: [id number] Patient ID: Sheri Wood, female   DOB: [birth year], 44 y.o.   MRN: [id number]  Psychiatric Assessment Adult  Patient Identification:  Sheri Wood Date of Evaluation:  02/13/2014 Chief Complaint: "I'm doing better" History of Chief Complaint:   Chief Complaint  Patient presents with  . Depression  . Follow-up  . Anxiety    Anxiety Symptoms include nervous/anxious behavior.     this patient is a 44 year old married white female who lives with her husband 67 year old daughter, 27-month-old grandson and 61 year old son in [place]. Her son is actually in college at [employer] state. 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  The patient was referred by Dr. Gerda Diss, her primary physician for further assessment and treatment of depression and anxiety.  The patient states she's had difficulties with depression and anxiety beginning about 18 years ago. She's always been a somewhat obsessional worried person he has a child. In childhood she would do rituals like counting and touching things in a certain way. She got older and had her own children she began to worry constantly about their safety and about germs.  When she was about 44 years old she started having panic attacks and depression. She had seen her primary doctor as well as Dr. Jennelle Human and Dr. Duard Brady, both psychiatrist in [place]. She's been tried on numerous antidepressants including Zoloft, Celexa, Effexor, Prozac, Wellbutrin and amitriptyline. We will work for a while and then stop. Since getting on these medicines she's also gained 60 pounds. Most recently she was tried on imipramine. When she got up to the 50 mg dosage she was unable to sleep. She also takes lorazepam which helps some with anxiety but she only takes it as needed. She's never had a psychiatric hospitalization. She's had individual and couples counseling  but it has been several years ago.  The patient states that it current symptoms are exacerbated by work stress. She has a great deal of data to compile at her job and feels overwhelmed. She often has meltdowns and begins crying at work. At home her daughter has moved in with her baby after her husband left her. The patient and her husband are feeling this financial strain of caring for them as well. The patient states that she has crying spells and feels depressed she eats too muchShe has never been suicidal or engaged in self-harm. She does not use drugs or alcohol. Her symptoms of depression irritability are much worse around the time of her menses. She suffers from migraine headaches which seem hormonally related as well. She also has fibromyalgia and restless leg syndrome and overall has little energy to do much of anything  The patient returns after 3 months. She could not tolerate the Viibryd and went back to Wellbutrin. She only uses Xanax now as needed and this is only of couple times a month prior to her menstrual cycle. Her mood is been stable. She is still struggling with her daughter but the daughter is going to get psychiatric help as well. Review of Systems  Constitutional: Positive for fatigue.  Eyes: Negative.   Respiratory: Negative.   Cardiovascular: Negative.   Gastrointestinal: Negative.   Endocrine: Negative.   Genitourinary: Negative.   Musculoskeletal: Positive for myalgias, arthralgias and neck pain.  Skin: Negative.   Allergic/Immunologic: Negative.   Neurological: Positive for syncope and headaches.  Hematological: Negative.   Psychiatric/Behavioral:  Positive for sleep disturbance and dysphoric mood. The patient is nervous/anxious.    Physical Exam  Depressive Symptoms: depressed mood, anhedonia, psychomotor agitation, fatigue, difficulty concentrating, impaired memory, anxiety, panic attacks, disturbed sleep, weight gain,  (Hypo) Manic Symptoms:   Elevated  Mood:  No Irritable Mood:  Yes Grandiosity:  No Distractibility:  No Labiality of Mood:  Yes Delusions:  No Hallucinations:  No Impulsivity:  No Sexually Inappropriate Behavior:  No Financial Extravagance:  No Flight of Ideas:  No  Anxiety Symptoms: Excessive Worry:  Yes Panic Symptoms:  Yes Agoraphobia:  No Obsessive Compulsive: Yes  Symptoms: Handwashing, Obsessive worrying, checking doors and windows Specific Phobias:  No Social Anxiety:  No  Psychotic Symptoms:  Hallucinations: No None Delusions:  No Paranoia:  No   Ideas of Reference:  No  PTSD Symptoms: Ever had a traumatic exposure:  No Had a traumatic exposure in the last month:  No Re-experiencing: No None Hypervigilance:  No Hyperarousal: No None Avoidance: No None  Traumatic Brain Injury: No   Past Psychiatric History: Diagnosis: Maj. depression, generalized anxiety disorder, OCD   Hospitalizations: none  Outpatient Care: Has seen 2 psychiatrists in [place] the past   Substance Abuse Care: none  Self-Mutilation: none  Suicidal Attempts: none  Violent Behaviors: none   Past Medical History:   Past Medical History  Diagnosis Date  . Vertigo   . Depression   . Urticaria, chronic   . IBS (irritable bowel syndrome)   . Allergy     seasonal  . Gastritis     history  . Anxiety   . SVD (spontaneous vaginal delivery)     x 2  . GERD (gastroesophageal reflux disease)     otc med prn  . Migraine   . Fibromyalgia    History of Loss of Consciousness:  No Seizure History:  No Cardiac History:  No Allergies:   Allergies  Allergen Reactions  . Cefzil [Cefprozil]     possible hives  . Bee Venom Swelling  . Sulfa Antibiotics Other (See Comments)    Abdominal cramping.   Current Medications:  Current Outpatient Prescriptions  Medication Sig Dispense Refill  . ALPRAZolam (XANAX) 1 MG tablet Take 1 tablet (1 mg total) by mouth daily as needed for anxiety. 30 tablet 2  . buPROPion (WELLBUTRIN  XL) 150 MG 24 hr tablet Take 1 tablet (150 mg total) by mouth every morning. 30 tablet 2  . Evening Primrose Oil 1000 MG CAPS Take 1 capsule by mouth 3 (three) times daily as needed. During Menstrual Cycle    . gabapentin (NEURONTIN) 600 MG tablet Take 600 mg by mouth 2 (two) times daily.     [name] loratadine (CLARITIN) 10 MG tablet Take 10 mg by mouth daily.    . meclizine (ANTIVERT) 25 MG tablet TAKE 1 TABLET BY MOUTH FOUR TIMES DAILY AS NEEDED 60 tablet 4  . Multiple Vitamin (MULTIVITAMIN WITH MINERALS) TABS tablet Take 1 tablet by mouth daily.    . pramipexole (MIRAPEX) 0.5 MG tablet Take 1 tablet (0.5 mg total) by mouth at bedtime. (Patient taking differently: Take 1 mg by mouth at bedtime. ) 30 tablet 5  . predniSONE (DELTASONE) 20 MG tablet TAKE 3 QD FOR 3 DAYS, THEN 2 QD FOR 3 DAYS, THEN 1 QD FOR 2 DAYS 17 tablet 0  . RELPAX 40 MG tablet TAKE 1 TABLET BY MOUTH AT ONSET OF MIGRAINE MAY REPEAT ONCE IN 2 HOURS AS NEEDED MAX OF 2 TABLETS PER DAY  8 tablet 5   No current facility-administered medications for this visit.    Previous Psychotropic Medications:  Medication Dose   Zoloft, Celexa, Effexor, Prozac, Provigil, Wellbutrin, amitriptyline, imipramine, lorazepam                        Substance Abuse History in the last 12 months: Substance Age of 1st Use Last Use Amount Specific Type  Nicotine      Alcohol      Cannabis      Opiates      Cocaine      Methamphetamines      LSD      Ecstasy      Benzodiazepines      Caffeine      Inhalants      Others:                          Medical Consequences of Substance Abuse: none  Legal Consequences of Substance Abuse: none  Family Consequences of Substance Abuse: none  Blackouts:  No DT's:  No Withdrawal Symptoms:  No None  Social History: Current Place of Residence: [place] [address] of Birth: [place] Family Members: Parents 1 brother, another brother deceased Marital Status:   Married Children:   Sons: 1  Daughters: 1 Relationships:  Education:  Corporate treasurer Problems/Performance: none Religious Beliefs/Practices: Christian History of Abuse: none Armed forces technical officer; Geophysicist/field seismologist, Chief Executive Officer History:  None. Legal History: none Hobbies/Interests: Reading, furniture refinishing  Family History:   Family History  Problem Relation Age of Onset  . Depression Mother   . Anxiety disorder Mother   . Anxiety disorder Brother   . Depression Brother   . Bipolar disorder Maternal Uncle   . Anxiety disorder Paternal Grandfather   . Depression Paternal Grandfather   . Bipolar disorder Cousin   . Alcohol abuse Brother   . Drug abuse Brother     Mental Status Examination/Evaluation: Objective:  Appearance: Neat and Well Groomed  Eye Contact::  Good  Speech:  Clear and Coherent  Volume:  Normal  Mood: good  Affect: Congruent   Thought Process:  Goal Directed  Orientation:  Full (Time, Place, and Person)  Thought Content: Within normal limit   Suicidal Thoughts:  No  Homicidal Thoughts:  No  Judgement:  Good  Insight:  Good  Psychomotor Activity:  Normal  Akathisia:  No  Handed:  Right  AIMS (if indicated):    Assets:  Communication Skills Desire for Improvement Financial Resources/Insurance Social Support Vocational/Educational    Laboratory/X-Ray Psychological Evaluation(s)   Reviewed in the chart      Assessment:  Axis I: Depressive Disorder NOS, Generalized Anxiety Disorder and Obsessive Compulsive Disorder  AXIS I Depressive Disorder NOS, Generalized Anxiety Disorder and Obsessive Compulsive Disorder  AXIS II Deferred  AXIS III Past Medical History  Diagnosis Date  . Vertigo   . Depression   . Urticaria, chronic   . IBS (irritable bowel syndrome)   . Allergy     seasonal  . Gastritis     history  . Anxiety   . SVD (spontaneous vaginal delivery)     x 2  . GERD (gastroesophageal reflux disease)      otc med prn  . Migraine   . Fibromyalgia      AXIS IV other psychosocial or environmental problems  AXIS V 51-60 moderate symptoms   Treatment Plan/Recommendations:  Plan  of Care: Medication management   Laboratory:    Psychotherapy: This was recommended but she declined at this time   Medications: She'll continue Wellbutrin XL 150 million every morning and Xanax 1 mg daily as needed for anxiety   Routine PRN Medications:  No  Consultations:   Safety Concerns:  She denies thoughts of self-harm   Other:  She'll return in 3 months     Diannia RuderOSS, Kadien Lineman, MD 1/7/20164:47 PM

## 2014-02-16 ENCOUNTER — Other Ambulatory Visit (HOSPITAL_COMMUNITY): Payer: Self-pay | Admitting: Psychiatry

## 2014-03-24 ENCOUNTER — Other Ambulatory Visit: Payer: Self-pay | Admitting: Nurse Practitioner

## 2014-04-22 ENCOUNTER — Other Ambulatory Visit: Payer: Self-pay | Admitting: Family Medicine

## 2014-05-07 ENCOUNTER — Other Ambulatory Visit: Payer: Self-pay | Admitting: Family Medicine

## 2014-05-15 ENCOUNTER — Ambulatory Visit (HOSPITAL_COMMUNITY): Payer: Self-pay | Admitting: Psychiatry

## 2014-05-26 ENCOUNTER — Telehealth: Payer: Self-pay | Admitting: Family Medicine

## 2014-05-26 MED ORDER — PRAMIPEXOLE DIHYDROCHLORIDE 0.75 MG PO TABS
0.7500 mg | ORAL_TABLET | Freq: Every day | ORAL | Status: DC
Start: 2014-05-26 — End: 2015-04-03

## 2014-05-26 NOTE — Telephone Encounter (Signed)
Ok. That's the max dose possible, six mo worth

## 2014-05-26 NOTE — Telephone Encounter (Signed)
Pt would like to go to a higher dose on her  pramipexole (MIRAPEX) 0.25 MG tablet  Pt would like to go to .75 says she has been taking .50mg    She states her legs are getting worse and is having to take a pill and a  1/2 at night when she gets ready to go to bed.    [facility]

## 2014-05-26 NOTE — Telephone Encounter (Signed)
Rx sent electronically to pharmacy. Patient notified. 

## 2014-06-06 ENCOUNTER — Ambulatory Visit (INDEPENDENT_AMBULATORY_CARE_PROVIDER_SITE_OTHER): Payer: BC Managed Care – PPO

## 2014-06-06 ENCOUNTER — Ambulatory Visit (INDEPENDENT_AMBULATORY_CARE_PROVIDER_SITE_OTHER): Payer: BC Managed Care – PPO | Admitting: Podiatry

## 2014-06-06 ENCOUNTER — Encounter: Payer: Self-pay | Admitting: Podiatry

## 2014-06-06 VITALS — BP 102/67 | HR 100 | Resp 12

## 2014-06-06 DIAGNOSIS — M722 Plantar fascial fibromatosis: Secondary | ICD-10-CM | POA: Diagnosis not present

## 2014-06-06 DIAGNOSIS — R52 Pain, unspecified: Secondary | ICD-10-CM | POA: Diagnosis not present

## 2014-06-06 MED ORDER — TRIAMCINOLONE ACETONIDE 10 MG/ML IJ SUSP
10.0000 mg | Freq: Once | INTRAMUSCULAR | Status: AC
  Administered 2014-06-06: 10 mg

## 2014-06-06 NOTE — Progress Notes (Signed)
Subjective:     Patient ID: Sheri Wood, female   DOB: [birth year], 44 y.o.   MRN: [id number]  HPI patient presents stating I have had severe heel pain for over 2 years with my right being worse than my left and I've had 3 sets of injections which have not given me relief of my symptoms and I have had an increase in discomfort over the last few months   Review of Systems  All other systems reviewed and are negative.      Objective:   Physical Exam  Constitutional: She is oriented to person, place, and time.  Cardiovascular: Intact distal pulses.   Musculoskeletal: Normal range of motion.  Neurological: She is oriented to person, place, and time.  Skin: Skin is warm.  Nursing note and vitals reviewed.  neurovascular status found to be intact with muscle strength adequate and range of motion subtalar midtarsal joint within normal limits. Patient's noted to have significant inflammation plantar heel right over left with fluid buildup around the medial band and is noted to have moderate depression of the arch. Patient has good digital perfusion and is well oriented 3 and does not have equinus noted     Assessment:     Acute plantar fasciitis with chronic nature to the condition right over left with failure to respond so far to conservative treatments    Plan:     H&P and x-rays reviewed with patient. Today I went ahead and I discussed conservative versus surgical treatments and explained that in the long run it's very possible that she will require endoscopic-type surgery. At this time I went ahead and I injected the right plantar fascia 3 Milligan Kenalog 5 g Xylocaine and dispensed an air fracture walker for complete immobilization. I then went ahead and scanned for custom orthotics to try to reduce plantar stress against her feet reappoint to recheck

## 2014-06-06 NOTE — Progress Notes (Signed)
   Subjective:    Patient ID: Sheri Wood, female    DOB: Aug 09, 1970, 44 y.o.   MRN: [id number]  HPI PT STATED B/L BOTTOM OF THE HEELS BEEN HURTING FOR 2 YEARS. THE HEELS ARE GETTING WORSE ESPECIALLY FIRST STEP IN THE MORNING OR WHEN SITTING FOR A WHILE THEN STAND UP CANT HARDLY CANNOT WALK. DR. Pricilla HolmUCKER PRESCRIBE INJECTION, BUT NO HELP.   Review of Systems  Constitutional: Positive for fatigue.  HENT: Positive for sinus pressure.   Neurological: Positive for dizziness and light-headedness.       Objective:   Physical Exam        Assessment & Plan:

## 2014-06-06 NOTE — Patient Instructions (Signed)
Plantar Fasciitis  Plantar fasciitis is a common condition that causes foot pain. It is soreness (inflammation) of the band of tough fibrous tissue on the bottom of the foot that runs from the heel bone (calcaneus) to the ball of the foot. The cause of this soreness may be from excessive standing, poor fitting shoes, running on hard surfaces, being overweight, having an abnormal walk, or overuse (this is common in runners) of the painful foot or feet. It is also common in aerobic exercise dancers and ballet dancers.  SYMPTOMS   Most people with plantar fasciitis complain of:   Severe pain in the morning on the bottom of their foot especially when taking the first steps out of bed. This pain recedes after a few minutes of walking.   Severe pain is experienced also during walking following a long period of inactivity.   Pain is worse when walking barefoot or up stairs  DIAGNOSIS    Your caregiver will diagnose this condition by examining and feeling your foot.   Special tests such as X-rays of your foot, are usually not needed.  PREVENTION    Consult a sports medicine professional before beginning a new exercise program.   Walking programs offer a good workout. With walking there is a lower chance of overuse injuries common to runners. There is less impact and less jarring of the joints.   Begin all new exercise programs slowly. If problems or pain develop, decrease the amount of time or distance until you are at a comfortable level.   Wear good shoes and replace them regularly.   Stretch your foot and the heel cords at the back of the ankle (Achilles tendon) both before and after exercise.   Run or exercise on even surfaces that are not hard. For example, asphalt is better than pavement.   Do not run barefoot on hard surfaces.   If using a treadmill, vary the incline.   Do not continue to workout if you have foot or joint problems. Seek professional help if they do not improve.  HOME CARE INSTRUCTIONS     Avoid activities that cause you pain until you recover.   Use ice or cold packs on the problem or painful areas after working out.   Only take over-the-counter or prescription medicines for pain, discomfort, or fever as directed by your caregiver.   Soft shoe inserts or athletic shoes with air or gel sole cushions may be helpful.   If problems continue or become more severe, consult a sports medicine caregiver or your own health care provider. Cortisone is a potent anti-inflammatory medication that may be injected into the painful area. You can discuss this treatment with your caregiver.  MAKE SURE YOU:    Understand these instructions.   Will watch your condition.   Will get help right away if you are not doing well or get worse.  Document Released: 10/19/2000 Document Revised: 04/18/2011 Document Reviewed: 12/19/2007  ExitCare Patient Information 2015 [employer]. This information is not intended to replace advice given to you by your health care provider. Make sure you discuss any questions you have with your health care provider.

## 2014-06-09 ENCOUNTER — Ambulatory Visit (INDEPENDENT_AMBULATORY_CARE_PROVIDER_SITE_OTHER): Payer: BC Managed Care – PPO | Admitting: Psychiatry

## 2014-06-09 ENCOUNTER — Encounter (HOSPITAL_COMMUNITY): Payer: Self-pay | Admitting: Psychiatry

## 2014-06-09 ENCOUNTER — Telehealth: Payer: Self-pay | Admitting: Podiatry

## 2014-06-09 VITALS — BP 113/67 | HR 88 | Ht 67.0 in | Wt 199.0 lb

## 2014-06-09 DIAGNOSIS — F42 Obsessive-compulsive disorder: Secondary | ICD-10-CM

## 2014-06-09 DIAGNOSIS — F411 Generalized anxiety disorder: Secondary | ICD-10-CM | POA: Diagnosis not present

## 2014-06-09 DIAGNOSIS — F329 Major depressive disorder, single episode, unspecified: Secondary | ICD-10-CM

## 2014-06-09 DIAGNOSIS — F32A Depression, unspecified: Secondary | ICD-10-CM

## 2014-06-09 MED ORDER — ALPRAZOLAM 1 MG PO TABS: 1.0000 mg | ORAL_TABLET | Freq: Every day | ORAL | Status: AC | PRN

## 2014-06-09 MED ORDER — LISDEXAMFETAMINE DIMESYLATE 30 MG PO CAPS: 30.0000 mg | ORAL_CAPSULE | ORAL | Status: DC

## 2014-06-09 NOTE — Telephone Encounter (Signed)
Patient was seen on Friday April 28th. She is in a Engineer, manufacturingAir Fracture Walker for 4 weeks and wants to know if she can still use an electipal exercise machine.

## 2014-06-09 NOTE — Progress Notes (Signed)
Patient ID: Sheri Wood, female   DOB: [birth year], 44 y.o.   MRN: [id number] Patient ID: Sheri Wood, female   DOB: 06/11/1970, 44 y.o.   MRN: [id number] Patient ID: Sheri Wood, female   DOB: 1971/01/15, 44 y.o.   MRN: [id number]  Psychiatric Assessment Adult  Patient Identification:  Sheri Wood Date of Evaluation:  06/09/2014 Chief Complaint: "I'm doing better" History of Chief Complaint:   Chief Complaint  Patient presents with  . Depression  . Anxiety  . Follow-up    Anxiety Symptoms include nervous/anxious behavior.     this patient is a 44 year old married white female who lives with her husband 55 year old daughter, 53-month-old grandson and 58 year old son in [place]. Her son is actually in college at [employer]. 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  The patient was referred by Dr. Gerda Diss, her primary physician for further assessment and treatment of depression and anxiety.  The patient states she's had difficulties with depression and anxiety beginning about 18 years ago. She's always been a somewhat obsessional worried person he has a child. In childhood she would do rituals like counting and touching things in a certain way. She got older and had her own children she began to worry constantly about their safety and about germs.  When she was about 44 years old she started having panic attacks and depression. She had seen her primary doctor as well as Dr. Jennelle Human and Dr. Duard Brady, both psychiatrist in [place]. She's been tried on numerous antidepressants including Zoloft, Celexa, Effexor, Prozac, Wellbutrin and amitriptyline. We will work for a while and then stop. Since getting on these medicines she's also gained 60 pounds. Most recently she was tried on imipramine. When she got up to the 50 mg dosage she was unable to sleep. She also takes lorazepam which helps some with anxiety but she only takes it as needed. She's  never had a psychiatric hospitalization. She's had individual and couples counseling but it has been several years ago.  The patient states that it current symptoms are exacerbated by work stress. She has a great deal of data to compile at her job and feels overwhelmed. She often has meltdowns and begins crying at work. At home her daughter has moved in with her baby after her husband left her. The patient and her husband are feeling this financial strain of caring for them as well. The patient states that she has crying spells and feels depressed she eats too muchShe has never been suicidal or engaged in self-harm. She does not use drugs or alcohol. Her symptoms of depression irritability are much worse around the time of her menses. She suffers from migraine headaches which seem hormonally related as well. She also has fibromyalgia and restless leg syndrome and overall has little energy to do much of anything  The patient returns after 5 months. She's got off the Wellbutrin. She uses the Xanax sporadically when she needs to often before her menstrual cycle starts. She is most concerned now about trying to lose weight. She states that she eats nonstop. She had some phentermine left over from an old prescription and she's tried it and it's helping a little bit. I told her we could try Vyvanse because it's been approved for binge eating disorder and she seems to match this description. She denies being depressed right now Review of Systems  Constitutional: Positive for fatigue.  Eyes: Negative.   Respiratory: Negative.  Cardiovascular: Negative.   Gastrointestinal: Negative.   Endocrine: Negative.   Genitourinary: Negative.   Musculoskeletal: Positive for myalgias, arthralgias and neck pain.  Skin: Negative.   Allergic/Immunologic: Negative.   Neurological: Positive for syncope and headaches.  Hematological: Negative.   Psychiatric/Behavioral: Positive for sleep disturbance and dysphoric mood. The  patient is nervous/anxious.    Physical Exam  Depressive Symptoms: depressed mood, anhedonia, psychomotor agitation, fatigue, difficulty concentrating, impaired memory, anxiety, panic attacks, disturbed sleep, weight gain,  (Hypo) Manic Symptoms:   Elevated Mood:  No Irritable Mood:  Yes Grandiosity:  No Distractibility:  No Labiality of Mood:  Yes Delusions:  No Hallucinations:  No Impulsivity:  No Sexually Inappropriate Behavior:  No Financial Extravagance:  No Flight of Ideas:  No  Anxiety Symptoms: Excessive Worry:  Yes Panic Symptoms:  Yes Agoraphobia:  No Obsessive Compulsive: Yes  Symptoms: Handwashing, Obsessive worrying, checking doors and windows Specific Phobias:  No Social Anxiety:  No  Psychotic Symptoms:  Hallucinations: No None Delusions:  No Paranoia:  No   Ideas of Reference:  No  PTSD Symptoms: Ever had a traumatic exposure:  No Had a traumatic exposure in the last month:  No Re-experiencing: No None Hypervigilance:  No Hyperarousal: No None Avoidance: No None  Traumatic Brain Injury: No   Past Psychiatric History: Diagnosis: Maj. depression, generalized anxiety disorder, OCD   Hospitalizations: none  Outpatient Care: Has seen 2 psychiatrists in [place] the past   Substance Abuse Care: none  Self-Mutilation: none  Suicidal Attempts: none  Violent Behaviors: none   Past Medical History:   Past Medical History  Diagnosis Date  . Vertigo   . Depression   . Urticaria, chronic   . IBS (irritable bowel syndrome)   . Allergy     seasonal  . Gastritis     history  . Anxiety   . SVD (spontaneous vaginal delivery)     x 2  . GERD (gastroesophageal reflux disease)     otc med prn  . Migraine   . Fibromyalgia    History of Loss of Consciousness:  No Seizure History:  No Cardiac History:  No Allergies:   Allergies  Allergen Reactions  . Cefzil [Cefprozil]     possible hives  . Bee Venom Swelling  . Sulfa Antibiotics  Other (See Comments)    Abdominal cramping.   Current Medications:  Current Outpatient Prescriptions  Medication Sig Dispense Refill  . ALPRAZolam (XANAX) 1 MG tablet Take 1 tablet (1 mg total) by mouth daily as needed for anxiety. 30 tablet 2  . Evening Primrose Oil 1000 MG CAPS Take 1 capsule by mouth 3 (three) times daily as needed. During Menstrual Cycle    . loratadine (CLARITIN) 10 MG tablet Take 10 mg by mouth daily.    . meclizine (ANTIVERT) 25 MG tablet TAKE 1 TABLET BY MOUTH FOUR TIMES DAILY AS NEEDED 60 tablet 4  . NON FORMULARY Taking clabetazol Cream twice a week    . phentermine 37.5 MG capsule Take 37.5 mg by mouth every morning.    . pramipexole (MIRAPEX) 0.75 MG tablet Take 1 tablet (0.75 mg total) by mouth at bedtime. 30 tablet 5  . RELPAX 40 MG tablet TAKE 1 TABLET BY MOUTH AT ONSET OF MIGRAINE MAY REPEAT ONCE IN 2 HOURS AS NEEDED MAX OF 2 TABLETS PER DAY 8 tablet 5  . terconazole (TERAZOL 7) 0.4 % vaginal cream INSERT 1 APPLICATORFUL VAGINALLY EVERY NIGHT AT BEDTIME (Patient taking differently: INSERT 1 APPLICATORFUL  VAGINALLY EVERY NIGHT AT BEDTIME PRN) 45 g 0  . lisdexamfetamine (VYVANSE) 30 MG capsule Take 1 capsule (30 mg total) by mouth every morning. 30 capsule 0   No current facility-administered medications for this visit.    Previous Psychotropic Medications:  Medication Dose   Zoloft, Celexa, Effexor, Prozac, Provigil, Wellbutrin, amitriptyline, imipramine, lorazepam                        Substance Abuse History in the last 12 months: Substance Age of 1st Use Last Use Amount Specific Type  Nicotine      Alcohol      Cannabis      Opiates      Cocaine      Methamphetamines      LSD      Ecstasy      Benzodiazepines      Caffeine      Inhalants      Others:                          Medical Consequences of Substance Abuse: none  Legal Consequences of Substance Abuse: none  Family Consequences of Substance Abuse: none  Blackouts:   No DT's:  No Withdrawal Symptoms:  No None  Social History: Current Place of Residence: [place] [address] of Birth: [place] Family Members: Parents 1 brother, another brother deceased Marital Status:  Married Children:   Sons: 1  Daughters: 1 Relationships:  Education:  Corporate treasurer Problems/Performance: none Religious Beliefs/Practices: Christian History of Abuse: none Armed forces technical officer; Geophysicist/field seismologist, Chief Executive Officer History:  None. Legal History: none Hobbies/Interests: Reading, furniture refinishing  Family History:   Family History  Problem Relation Age of Onset  . Depression Mother   . Anxiety disorder Mother   . Anxiety disorder Brother   . Depression Brother   . Bipolar disorder Maternal Uncle   . Anxiety disorder Paternal Grandfather   . Depression Paternal Grandfather   . Bipolar disorder Cousin   . Alcohol abuse Brother   . Drug abuse Brother     Mental Status Examination/Evaluation: Objective:  Appearance: Neat and Well Groomed  Eye Contact::  Good  Speech:  Clear and Coherent  Volume:  Normal  Mood: good  Affect: Congruent   Thought Process:  Goal Directed  Orientation:  Full (Time, Place, and Person)  Thought Content: Within normal limit   Suicidal Thoughts:  No  Homicidal Thoughts:  No  Judgement:  Good  Insight:  Good  Psychomotor Activity:  Normal  Akathisia:  No  Handed:  Right  AIMS (if indicated):    Assets:  Communication Skills Desire for Improvement Financial Resources/Insurance Social Support Vocational/Educational    Laboratory/X-Ray Psychological Evaluation(s)   Reviewed in the chart      Assessment:  Axis I: Depressive Disorder NOS, Generalized Anxiety Disorder and Obsessive Compulsive Disorder  AXIS I Depressive Disorder NOS, Generalized Anxiety Disorder and Obsessive Compulsive Disorder  AXIS II Deferred  AXIS III Past Medical History  Diagnosis Date  .  Vertigo   . Depression   . Urticaria, chronic   . IBS (irritable bowel syndrome)   . Allergy     seasonal  . Gastritis     history  . Anxiety   . SVD (spontaneous vaginal delivery)     x 2  . GERD (gastroesophageal reflux disease)     otc med prn  . Migraine   .  Fibromyalgia      AXIS IV other psychosocial or environmental problems  AXIS V 51-60 moderate symptoms   Treatment Plan/Recommendations:  Plan of Care: Medication management   Laboratory:    Psychotherapy: This was recommended but she declined at this time   Medications: She'll continue  Xanax 1 mg daily as needed for anxiety . she will start Vyvanse 30 mg every morning for binge eating disorder. She is been told not to use this with phentermine   Routine PRN Medications:  No  Consultations:   Safety Concerns:  She denies thoughts of self-harm   Other:  She'll return in 4 weeks     Diannia Ruder, MD 5/2/20164:47 PM

## 2014-06-10 ENCOUNTER — Telehealth (HOSPITAL_COMMUNITY): Payer: Self-pay | Admitting: *Deleted

## 2014-06-10 NOTE — Telephone Encounter (Signed)
Prior authorization received for Vyvanse 30mg . Called and spoke with Gavin PoundDeborah who gave approval [id number] from 05/11/14-06/10/15. Notified pharmacy of approval.

## 2014-06-10 NOTE — Telephone Encounter (Addendum)
I spoke with pt and she says she is having throbbing in the calf when the Air Fracture Dan HumphreysWalker was on, but got relief if the boot was off.  I asked pt if her calf was hot, red or swollen, pt denies these symptoms.  I called pt again at Dr. Beverlee Nimsegal's request and pt states she feels much better able to move the foot and ankle out of the boot periodically through the day.  I encouraged pt to call with any questions or concerns.

## 2014-06-10 NOTE — Telephone Encounter (Signed)
Check on her again please

## 2014-06-11 ENCOUNTER — Telehealth: Payer: Self-pay | Admitting: *Deleted

## 2014-06-11 NOTE — Telephone Encounter (Signed)
Pt states she is having severe pain in her foot, worse than before shooting from her heel to her toes.  Pt states she has taken the boot off and it feels much better.  I told pt I would inform Dr. Charlsie Merlesegal and call with instructions.  I encouraged pt to ice and rest.

## 2014-06-12 ENCOUNTER — Telehealth: Payer: Self-pay | Admitting: Podiatry

## 2014-06-12 NOTE — Telephone Encounter (Signed)
I spoke with Dr. Charlsie Merlesegal he stated continue pt with the instructions I gave yesterday.  I called [phone] and informed pt of Dr. Beverlee Nimsegal's recommendations and to periodically try to get back into the boot, possibly when resting and not walking.  I encouraged ice 3 - 4 times a day 10 - 15 minutes to help decrease inflammation.  Pt asked why her foot hurt worse in the boo, I explained tha if the foot is too inflamed, then stretching and putting in proper position may cause spasms, but possibly if she continued with the ice, she could decease the inflammation and be able to wear the boot some.  Pt agreed.

## 2014-06-12 NOTE — Telephone Encounter (Signed)
Continue with what icing and rest.

## 2014-06-12 NOTE — Telephone Encounter (Signed)
Sheri HammedJessica Wood call pt to check if she wanted an appt for today.

## 2014-06-12 NOTE — Telephone Encounter (Signed)
Entered in error

## 2014-06-12 NOTE — Telephone Encounter (Signed)
Patient called back and stated she felt she didn't need to come in today. She stated she spoke with you yesterday about the boot and that you were going to talk to Dr. Charlsie Merlesegal and call her back. If you need to reach her before 3:30pm today, please call her work number which is [phone] or if it is after 3:30pm call her mobile number which is [phone]. Thank you.

## 2014-06-17 ENCOUNTER — Telehealth (HOSPITAL_COMMUNITY): Payer: Self-pay | Admitting: *Deleted

## 2014-06-17 ENCOUNTER — Other Ambulatory Visit: Payer: Self-pay | Admitting: Psychiatry

## 2014-06-17 MED ORDER — BUPROPION HCL ER (SR) 150 MG PO TB12: 150.0000 mg | ORAL_TABLET | Freq: Two times a day (BID) | ORAL | Status: DC

## 2014-06-17 NOTE — Telephone Encounter (Signed)
Pt is aware of what Dr. Tenny Crawoss would like her to do due to previous call. Pt shows understanding.

## 2014-06-17 NOTE — Telephone Encounter (Signed)
Pt called stating that her Depression have started again. Per pt her Vyvanse have not been working. Per pt she have had some issues in her family and everything has been getting on her nervious. Per pt she would like to come into office sooner then her scheduled appt but informed pt there's no openings before 07-08-14 after 4:00pm. Per pt she would like to ask Dr. Tenny Crawoss if she should restart her Wellbutrin and will discuss this in more details at next scheduled appt. Pt would like to know if she could take Wellbutrin and Vyvanse together. Pt also stated that if she restarts the Wellbutrin she would need refills on that as well. Per pt she use to take the SR of her Wellbutrin and that was helping her better then the XR. Pt number is [phone].

## 2014-06-17 NOTE — Telephone Encounter (Signed)
Per Dr. Tenny Crawoss to call in pt medication (Bupropion) due to e-script not allowing her to do so. Called pt pharmacy and spoke with Lorin PicketScott at pt pharmacy. Per Lorin PicketScott he will get pt medication ready

## 2014-06-17 NOTE — Telephone Encounter (Signed)
She can restart Wellbutrin SR and take it with Vyvanse

## 2014-06-27 ENCOUNTER — Other Ambulatory Visit: Payer: Self-pay | Admitting: Family Medicine

## 2014-07-02 NOTE — Telephone Encounter (Signed)
Entered in error

## 2014-07-04 ENCOUNTER — Ambulatory Visit: Payer: BC Managed Care – PPO | Admitting: Podiatry

## 2014-07-08 ENCOUNTER — Encounter (HOSPITAL_COMMUNITY): Payer: Self-pay | Admitting: Psychiatry

## 2014-07-08 ENCOUNTER — Ambulatory Visit (INDEPENDENT_AMBULATORY_CARE_PROVIDER_SITE_OTHER): Payer: BC Managed Care – PPO | Admitting: Psychiatry

## 2014-07-08 VITALS — BP 103/91 | HR 81 | Ht 67.0 in | Wt 199.2 lb

## 2014-07-08 DIAGNOSIS — F42 Obsessive-compulsive disorder: Secondary | ICD-10-CM | POA: Diagnosis not present

## 2014-07-08 DIAGNOSIS — F32A Depression, unspecified: Secondary | ICD-10-CM

## 2014-07-08 DIAGNOSIS — F411 Generalized anxiety disorder: Secondary | ICD-10-CM | POA: Diagnosis not present

## 2014-07-08 DIAGNOSIS — F329 Major depressive disorder, single episode, unspecified: Secondary | ICD-10-CM | POA: Diagnosis not present

## 2014-07-08 MED ORDER — BUPROPION HCL ER (SR) 150 MG PO TB12: 150.0000 mg | ORAL_TABLET | Freq: Two times a day (BID) | ORAL | Status: DC

## 2014-07-08 NOTE — Progress Notes (Signed)
Patient ID: CORNELLA EMMER, female   DOB: Jan 12, 1971, 44 y.o.   MRN: [id number] Patient ID: BETINA PUCKETT, female   DOB: 14-Mar-1970, 44 y.o.   MRN: [id number] Patient ID: DAPHYNE MIGUEZ, female   DOB: Apr 17, 1970, 44 y.o.   MRN: [id number] Patient ID: LONDIN ANTONE, female   DOB: Feb 25, 1970, 44 y.o.   MRN: [id number]  Psychiatric Assessment Adult  Patient Identification:  DYNA FIGUEREO Date of Evaluation:  07/08/2014 Chief Complaint: "I'm doing better" History of Chief Complaint:   Chief Complaint  Patient presents with  . Depression  . Anxiety  . Follow-up    Anxiety Symptoms include nervous/anxious behavior.     this patient is a 44 year old married white female who lives with her husband 33 year old daughter, 23-month-old grandson and 93 year old son in [place]. Her son is actually in college at [employer] state. 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  The patient was referred by Dr. Gerda Diss, her primary physician for further assessment and treatment of depression and anxiety.  The patient states she's had difficulties with depression and anxiety beginning about 18 years ago. She's always been a somewhat obsessional worried person he has a child. In childhood she would do rituals like counting and touching things in a certain way. She got older and had her own children she began to worry constantly about their safety and about germs.  When she was about 44 years old she started having panic attacks and depression. She had seen her primary doctor as well as Dr. Jennelle Human and Dr. Duard Brady, both psychiatrist in [place]. She's been tried on numerous antidepressants including Zoloft, Celexa, Effexor, Prozac, Wellbutrin and amitriptyline. We will work for a while and then stop. Since getting on these medicines she's also gained 60 pounds. Most recently she was tried on imipramine. When she got up to the 50 mg dosage she was unable to sleep. She also takes  lorazepam which helps some with anxiety but she only takes it as needed. She's never had a psychiatric hospitalization. She's had individual and couples counseling but it has been several years ago.  The patient states that it current symptoms are exacerbated by work stress. She has a great deal of data to compile at her job and feels overwhelmed. She often has meltdowns and begins crying at work. At home her daughter has moved in with her baby after her husband left her. The patient and her husband are feeling this financial strain of caring for them as well. The patient states that she has crying spells and feels depressed she eats too muchShe has never been suicidal or engaged in self-harm. She does not use drugs or alcohol. Her symptoms of depression irritability are much worse around the time of her menses. She suffers from migraine headaches which seem hormonally related as well. She also has fibromyalgia and restless leg syndrome and overall has little energy to do much of anything  The patient returns after one month. She tried the Vyvanse but it made her "feel funny and she stopped it. She called to go back on the Wellbutrin SR and is starting to help. She has a lot of stress with her 45 year old daughter was moved out on her own "into a dump". The daughter refuses to take help from her family and has a 50-year-old child she is supporting. The patient realizes only somewhat she can do to remedy the situation. Review of Systems  Constitutional: Positive for fatigue.  Eyes: Negative.   Respiratory: Negative.   Cardiovascular: Negative.   Gastrointestinal: Negative.   Endocrine: Negative.   Genitourinary: Negative.   Musculoskeletal: Positive for myalgias, arthralgias and neck pain.  Skin: Negative.   Allergic/Immunologic: Negative.   Neurological: Positive for syncope and headaches.  Hematological: Negative.   Psychiatric/Behavioral: Positive for sleep disturbance and dysphoric mood. The  patient is nervous/anxious.    Physical Exam  Depressive Symptoms: depressed mood, anhedonia, psychomotor agitation, fatigue, difficulty concentrating, impaired memory, anxiety, panic attacks, disturbed sleep, weight gain,  (Hypo) Manic Symptoms:   Elevated Mood:  No Irritable Mood:  Yes Grandiosity:  No Distractibility:  No Labiality of Mood:  Yes Delusions:  No Hallucinations:  No Impulsivity:  No Sexually Inappropriate Behavior:  No Financial Extravagance:  No Flight of Ideas:  No  Anxiety Symptoms: Excessive Worry:  Yes Panic Symptoms:  Yes Agoraphobia:  No Obsessive Compulsive: Yes  Symptoms: Handwashing, Obsessive worrying, checking doors and windows Specific Phobias:  No Social Anxiety:  No  Psychotic Symptoms:  Hallucinations: No None Delusions:  No Paranoia:  No   Ideas of Reference:  No  PTSD Symptoms: Ever had a traumatic exposure:  No Had a traumatic exposure in the last month:  No Re-experiencing: No None Hypervigilance:  No Hyperarousal: No None Avoidance: No None  Traumatic Brain Injury: No   Past Psychiatric History: Diagnosis: Maj. depression, generalized anxiety disorder, OCD   Hospitalizations: none  Outpatient Care: Has seen 2 psychiatrists in [place] the past   Substance Abuse Care: none  Self-Mutilation: none  Suicidal Attempts: none  Violent Behaviors: none   Past Medical History:   Past Medical History  Diagnosis Date  . Vertigo   . Depression   . Urticaria, chronic   . IBS (irritable bowel syndrome)   . Allergy     seasonal  . Gastritis     history  . Anxiety   . SVD (spontaneous vaginal delivery)     x 2  . GERD (gastroesophageal reflux disease)     otc med prn  . Migraine   . Fibromyalgia    History of Loss of Consciousness:  No Seizure History:  No Cardiac History:  No Allergies:   Allergies  Allergen Reactions  . Cefzil [Cefprozil]     possible hives  . Bee Venom Swelling  . Sulfa Antibiotics  Other (See Comments)    Abdominal cramping.   Current Medications:  Current Outpatient Prescriptions  Medication Sig Dispense Refill  . ALPRAZolam (XANAX) 1 MG tablet Take 1 tablet (1 mg total) by mouth daily as needed for anxiety. 30 tablet 2  . buPROPion (WELLBUTRIN SR) 150 MG 12 hr tablet Take 1 tablet (150 mg total) by mouth 2 (two) times daily. 60 tablet 2  . buPROPion (WELLBUTRIN SR) 150 MG 12 hr tablet Take 1 tablet (150 mg total) by mouth 2 (two) times daily. 60 tablet 2  . Evening Primrose Oil 1000 MG CAPS Take 1 capsule by mouth 3 (three) times daily as needed. During Menstrual Cycle    . loratadine (CLARITIN) 10 MG tablet Take 10 mg by mouth daily.    . meclizine (ANTIVERT) 25 MG tablet TAKE 1 TABLET BY MOUTH FOUR TIMES DAILY AS NEEDED 60 tablet 0  . NON FORMULARY Taking clabetazol Cream twice a week    . pramipexole (MIRAPEX) 0.75 MG tablet Take 1 tablet (0.75 mg total) by mouth at bedtime. 30 tablet 5  . RELPAX 40 MG tablet TAKE 1 TABLET BY MOUTH AT  ONSET OF MIGRAINE MAY REPEAT ONCE IN 2 HOURS AS NEEDED MAX OF 2 TABLETS PER DAY 8 tablet 5  . terconazole (TERAZOL 7) 0.4 % vaginal cream INSERT 1 APPLICATORFUL VAGINALLY EVERY NIGHT AT BEDTIME (Patient taking differently: INSERT 1 APPLICATORFUL VAGINALLY EVERY NIGHT AT BEDTIME PRN) 45 g 0  . lisdexamfetamine (VYVANSE) 30 MG capsule Take 1 capsule (30 mg total) by mouth every morning. (Patient not taking: Reported on 07/08/2014) 30 capsule 0  . phentermine 37.5 MG capsule Take 37.5 mg by mouth every morning.     No current facility-administered medications for this visit.    Previous Psychotropic Medications:  Medication Dose   Zoloft, Celexa, Effexor, Prozac, Provigil, Wellbutrin, amitriptyline, imipramine, lorazepam                        Substance Abuse History in the last 12 months: Substance Age of 1st Use Last Use Amount Specific Type  Nicotine      Alcohol      Cannabis      Opiates      Cocaine       Methamphetamines      LSD      Ecstasy      Benzodiazepines      Caffeine      Inhalants      Others:                          Medical Consequences of Substance Abuse: none  Legal Consequences of Substance Abuse: none  Family Consequences of Substance Abuse: none  Blackouts:  No DT's:  No Withdrawal Symptoms:  No None  Social History: Current Place of Residence: [place] [address] of Birth: [place] Family Members: Parents 1 brother, another brother deceased Marital Status:  Married Children:   Sons: 1  Daughters: 1 Relationships:  Education:  Corporate treasurer Problems/Performance: none Religious Beliefs/Practices: Christian History of Abuse: none Armed forces technical officer; Geophysicist/field seismologist, Chief Executive Officer History:  None. Legal History: none Hobbies/Interests: Reading, furniture refinishing  Family History:   Family History  Problem Relation Age of Onset  . Depression Mother   . Anxiety disorder Mother   . Anxiety disorder Brother   . Depression Brother   . Bipolar disorder Maternal Uncle   . Anxiety disorder Paternal Grandfather   . Depression Paternal Grandfather   . Bipolar disorder Cousin   . Alcohol abuse Brother   . Drug abuse Brother     Mental Status Examination/Evaluation: Objective:  Appearance: Neat and Well Groomed  Eye Contact::  Good  Speech:  Clear and Coherent  Volume:  Normal  Mood: Fairly good   Affect: Congruent   Thought Process:  Goal Directed  Orientation:  Full (Time, Place, and Person)  Thought Content: Within normal limits   Suicidal Thoughts:  No  Homicidal Thoughts:  No  Judgement:  Good  Insight:  Good  Psychomotor Activity:  Normal  Akathisia:  No  Handed:  Right  AIMS (if indicated):    Assets:  Communication Skills Desire for Improvement Financial Resources/Insurance Social Support Vocational/Educational    Laboratory/X-Ray Psychological Evaluation(s)    Reviewed in the chart      Assessment:  Axis I: Depressive Disorder NOS, Generalized Anxiety Disorder and Obsessive Compulsive Disorder  AXIS I Depressive Disorder NOS, Generalized Anxiety Disorder and Obsessive Compulsive Disorder  AXIS II Deferred  AXIS III Past Medical History  Diagnosis Date  . Vertigo   .  Depression   . Urticaria, chronic   . IBS (irritable bowel syndrome)   . Allergy     seasonal  . Gastritis     history  . Anxiety   . SVD (spontaneous vaginal delivery)     x 2  . GERD (gastroesophageal reflux disease)     otc med prn  . Migraine   . Fibromyalgia      AXIS IV other psychosocial or environmental problems  AXIS V 51-60 moderate symptoms   Treatment Plan/Recommendations:  Plan of Care: Medication management   Laboratory:    Psychotherapy: This was recommended but she declined at this time   Medications: She'll continue  Xanax 1 mg daily as needed for anxiety . she continue Wellbutrin SR 150 mg twice a day   Routine PRN Medications:  No  Consultations:   Safety Concerns:  She denies thoughts of self-harm   Other:  She'll return in 2 months     Diannia RuderOSS, Estreya Clay, MD 5/31/20164:39 PM

## 2014-07-10 ENCOUNTER — Ambulatory Visit (INDEPENDENT_AMBULATORY_CARE_PROVIDER_SITE_OTHER): Payer: BC Managed Care – PPO | Admitting: Podiatry

## 2014-07-10 DIAGNOSIS — M722 Plantar fascial fibromatosis: Secondary | ICD-10-CM

## 2014-07-10 MED ORDER — TRIAMCINOLONE ACETONIDE 10 MG/ML IJ SUSP
10.0000 mg | Freq: Once | INTRAMUSCULAR | Status: AC
  Administered 2014-07-10: 10 mg

## 2014-07-10 NOTE — Patient Instructions (Signed)

## 2014-07-11 NOTE — Progress Notes (Signed)
Subjective:     Patient ID: Sheri GalasJennifer L Wood, female   DOB: 08/23/1970, 44 y.o.   MRN: [id number]  HPI patient presents stating that the heel is still sore and it's worse when she gets up in the morning or after periods of sitting   Review of Systems     Objective:   Physical Exam Neurovascular status intact muscle strength adequate with discomfort in the plantar aspect of the heel with inflammation and fluid around the medial band still noted with pain especially after periods of sitting or in the morning    Assessment:     Continue plan her fasciitis that has not responded so far to conservative care    Plan:     Reinjected the inflamed plantar fascia 3 Milligan Kenalog 5 g Xylocaine and did apply night splint with all instructions on usage today. Dispensed orthotics with instructions and reappoint to recheck

## 2014-07-18 ENCOUNTER — Encounter: Payer: Self-pay | Admitting: Family Medicine

## 2014-07-18 ENCOUNTER — Ambulatory Visit (INDEPENDENT_AMBULATORY_CARE_PROVIDER_SITE_OTHER): Payer: BC Managed Care – PPO | Admitting: Family Medicine

## 2014-07-18 VITALS — BP 110/78 | Temp 98.2°F | Ht 67.0 in | Wt 198.0 lb

## 2014-07-18 DIAGNOSIS — G43009 Migraine without aura, not intractable, without status migrainosus: Secondary | ICD-10-CM

## 2014-07-18 DIAGNOSIS — J329 Chronic sinusitis, unspecified: Secondary | ICD-10-CM | POA: Diagnosis not present

## 2014-07-18 DIAGNOSIS — H8110 Benign paroxysmal vertigo, unspecified ear: Secondary | ICD-10-CM

## 2014-07-18 DIAGNOSIS — M542 Cervicalgia: Secondary | ICD-10-CM

## 2014-07-18 DIAGNOSIS — J310 Chronic rhinitis: Secondary | ICD-10-CM

## 2014-07-18 DIAGNOSIS — S161XXA Strain of muscle, fascia and tendon at neck level, initial encounter: Secondary | ICD-10-CM

## 2014-07-18 MED ORDER — DIAZEPAM 5 MG PO TABS: ORAL_TABLET | ORAL | Status: DC

## 2014-07-18 MED ORDER — LEVOFLOXACIN 500 MG PO TABS: ORAL_TABLET | ORAL | Status: DC

## 2014-07-18 MED ORDER — MECLIZINE HCL 25 MG PO TABS: 25.0000 mg | ORAL_TABLET | Freq: Three times a day (TID) | ORAL | Status: DC | PRN

## 2014-07-18 MED ORDER — HYDROCODONE-ACETAMINOPHEN 5-325 MG PO TABS: 1.0000 | ORAL_TABLET | Freq: Four times a day (QID) | ORAL | Status: DC | PRN

## 2014-07-18 NOTE — Progress Notes (Signed)
   Subjective:    Patient ID: Sheri Wood, female    DOB: Dec 02, 1970, 44 y.o.   MRN: [id number]  Sinusitis The current episode started 1 to 4 weeks ago. The problem is unchanged. There has been no fever. Associated symptoms include ear pain and sinus pressure. (Dizziness, burning nasal passages) Treatments tried: Claritin, Mucinex D, The treatment provided no relief.   Started with substantial vertigo   Ears fluttering at times  Pt back on mucinex and claritin  Also has migr like sensation  Patient states no other concerns.  migr headache s  Frontal pressure and burning thru the npose  Pain on into the neck and back. Worse with computer screen and incr pressure and discomfort in thid region   Some cong and dranage and full sens in the ears   RLL now doable and under control  At [facility] in [place]   Patient has ongoing challenges with dizziness. Worse with turning head in certain directions. Has come and gone in recent years. Has seen specialists in the past. At times very challenging  Diminished energy  Review of Systems  HENT: Positive for ear pain and sinus pressure.        Objective:   Physical Exam Alert mild malaise no acute distress neck supple pain with lateral and for motion. Pain is primarily supra scapular/supraclavicular left side greater than right. TMs normal moderate nasal congestion frontal tenderness pharynx on next up lungs clear heart regular in rhythm.       Assessment & Plan:  Impression 1 rhinosinusitis #2 probable tension headaches element #3 vertigo ongoing challenge #4 cervical strain related to 2 #5 increased stress at work him her 6 history of migraines plan antibiosis prescribed. Symptom care discussed. Pain medicine prescribed. Muscle spasms major prescribed. 25 minutes spent most in discussion WSL

## 2014-07-21 ENCOUNTER — Telehealth: Payer: Self-pay | Admitting: Family Medicine

## 2014-07-21 MED ORDER — DOXYCYCLINE HYCLATE 100 MG PO TABS
100.0000 mg | ORAL_TABLET | Freq: Two times a day (BID) | ORAL | Status: DC
Start: 2014-07-21 — End: 2014-12-03

## 2014-07-21 NOTE — Telephone Encounter (Signed)
Doxy 100 bid ten d 

## 2014-07-21 NOTE — Telephone Encounter (Signed)
Left message on voicemail notifying patient that med was sent to pharmacy.  

## 2014-07-21 NOTE — Telephone Encounter (Signed)
Patient says that she was prescribed levaquin last week and it made her really sick to her stomach.  She called the nurse line over the weekend and was prescribed bactrim.  She said she didn't realize at the time that this was sulfa based and is allergic to this.  She wants to know if we can call in doxycycline.  Seen on 07/18/2014 for rhinosinusitis.   [facility]

## 2014-08-07 ENCOUNTER — Ambulatory Visit: Payer: BC Managed Care – PPO | Admitting: Podiatry

## 2014-08-18 ENCOUNTER — Ambulatory Visit: Payer: BC Managed Care – PPO | Admitting: Podiatry

## 2014-08-21 ENCOUNTER — Other Ambulatory Visit: Payer: Self-pay | Admitting: Nurse Practitioner

## 2014-09-05 ENCOUNTER — Ambulatory Visit (HOSPITAL_COMMUNITY): Payer: Self-pay | Admitting: Psychiatry

## 2014-09-17 ENCOUNTER — Telehealth: Payer: Self-pay | Admitting: Family Medicine

## 2014-09-17 ENCOUNTER — Other Ambulatory Visit: Payer: Self-pay | Admitting: Nurse Practitioner

## 2014-09-17 MED ORDER — MECLIZINE HCL 25 MG PO TABS: 25.0000 mg | ORAL_TABLET | Freq: Three times a day (TID) | ORAL | Status: DC | PRN

## 2014-09-17 MED ORDER — DIAZEPAM 5 MG PO TABS: ORAL_TABLET | ORAL | Status: DC

## 2014-09-17 NOTE — Telephone Encounter (Signed)
Pt is requesting refills on mecklezine and diazipam   [facility]

## 2014-09-17 NOTE — Telephone Encounter (Signed)
Meclizine and diazepam

## 2014-10-28 ENCOUNTER — Other Ambulatory Visit: Payer: Self-pay | Admitting: Family Medicine

## 2014-11-08 ENCOUNTER — Other Ambulatory Visit: Payer: Self-pay | Admitting: Nurse Practitioner

## 2014-11-08 ENCOUNTER — Other Ambulatory Visit: Payer: Self-pay | Admitting: Family Medicine

## 2014-11-25 ENCOUNTER — Other Ambulatory Visit: Payer: Self-pay | Admitting: Family Medicine

## 2014-12-03 ENCOUNTER — Encounter: Payer: Self-pay | Admitting: Podiatry

## 2014-12-03 ENCOUNTER — Ambulatory Visit: Payer: BC Managed Care – PPO | Admitting: Podiatry

## 2014-12-03 ENCOUNTER — Ambulatory Visit (INDEPENDENT_AMBULATORY_CARE_PROVIDER_SITE_OTHER): Payer: BC Managed Care – PPO | Admitting: Podiatry

## 2014-12-03 ENCOUNTER — Ambulatory Visit (INDEPENDENT_AMBULATORY_CARE_PROVIDER_SITE_OTHER): Payer: BC Managed Care – PPO

## 2014-12-03 VITALS — BP 102/58 | HR 76 | Resp 16

## 2014-12-03 DIAGNOSIS — M779 Enthesopathy, unspecified: Secondary | ICD-10-CM

## 2014-12-03 MED ORDER — TRIAMCINOLONE ACETONIDE 10 MG/ML IJ SUSP
10.0000 mg | Freq: Once | INTRAMUSCULAR | Status: AC
  Administered 2014-12-03: 10 mg

## 2014-12-04 NOTE — Progress Notes (Signed)
Subjective:     Patient ID: Sheri Wood, female   DOB: [birth year], 44 y.o.   MRN: [id number]  HPI patient states my left foot has started to bother me quite a bit and it's been inflamed in the joint and making it hard to walk. I do not remember specific injury and it's been present for several months   Review of Systems  All other systems reviewed and are negative.      Objective:   Physical Exam Neurovascular status found to be intact muscle strength was adequate with range of motion within normal limits. Patient's noted to have inflammation and fluid around the second MPJ left it's painful when pressed and making walking and shoe gear difficult    Assessment:     Inflammatory capsulitis second MPJ left    Plan:     Reviewed condition and did a proximal nerve block and aspirated the second MPJ getting out of small amount of clear fluid. I then injected with half cc of dexamethasone Kenalog and applied padding and instructed on reduced activity. Reappoint to check in the next 2 weeks

## 2014-12-08 ENCOUNTER — Encounter (HOSPITAL_COMMUNITY): Payer: Self-pay | Admitting: Psychiatry

## 2014-12-08 ENCOUNTER — Ambulatory Visit (INDEPENDENT_AMBULATORY_CARE_PROVIDER_SITE_OTHER): Payer: BC Managed Care – PPO | Admitting: Psychiatry

## 2014-12-08 VITALS — BP 105/64 | HR 70 | Ht 67.0 in | Wt 193.8 lb

## 2014-12-08 DIAGNOSIS — F329 Major depressive disorder, single episode, unspecified: Secondary | ICD-10-CM | POA: Diagnosis not present

## 2014-12-08 DIAGNOSIS — F429 Obsessive-compulsive disorder, unspecified: Secondary | ICD-10-CM | POA: Diagnosis not present

## 2014-12-08 DIAGNOSIS — F411 Generalized anxiety disorder: Secondary | ICD-10-CM | POA: Diagnosis not present

## 2014-12-08 DIAGNOSIS — F32A Depression, unspecified: Secondary | ICD-10-CM

## 2014-12-08 MED ORDER — BUPROPION HCL ER (SR) 150 MG PO TB12: 150.0000 mg | ORAL_TABLET | Freq: Two times a day (BID) | ORAL | Status: DC

## 2014-12-08 NOTE — Progress Notes (Signed)
Patient ID: Sheri Wood, female   DOB: 1970/09/01, 44 y.o.   MRN: [id number] Patient ID: Sheri GalasJennifer L Wood, female   DOB: 1970/09/01, 44 y.o.   MRN: [id number] Patient ID: Sheri GalasJennifer L Wood, female   DOB: 1970/09/01, 44 y.o.   MRN: [id number] Patient ID: Sheri GalasJennifer L Wood, female   DOB: 1970/09/01, 44 y.o.   MRN: [id number] Patient ID: Sheri GalasJennifer L Wood, female   DOB: 1970/09/01, 44 y.o.   MRN: [id number]  Psychiatric Assessment Adult  Patient Identification:  Sheri Wood Date of Evaluation:  12/08/2014 Chief Complaint: "I'm doing better" History of Chief Complaint:   Chief Complaint  Patient presents with  . Depression  . Follow-up    Depression        Associated symptoms include fatigue, myalgias and headaches.  Past medical history includes anxiety.   Anxiety Symptoms include nervous/anxious behavior.     this patient is a 44 year old married white female who lives with her husband 45 year old daughter, 4842-month-old grandson and 45 year old son in [place]. Her son is actually in college at [employer]. 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  The patient was referred by Dr. Gerda DissLuking, her primary physician for further assessment and treatment of depression and anxiety.  The patient states she's had difficulties with depression and anxiety beginning about 18 years ago. She's always been a somewhat obsessional worried person he has a child. In childhood she would do rituals like counting and touching things in a certain way. She got older and had her own children she began to worry constantly about their safety and about germs.  When she was about 44 years old she started having panic attacks and depression. She had seen her primary doctor as well as Dr. Jennelle Humanottle and Dr. Duard BradyKahr, both psychiatrist in [place]. She's been tried on numerous antidepressants including Zoloft, Celexa, Effexor, Prozac, Wellbutrin and amitriptyline. We will work for a while  and then stop. Since getting on these medicines she's also gained 60 pounds. Most recently she was tried on imipramine. When she got up to the 50 mg dosage she was unable to sleep. She also takes lorazepam which helps some with anxiety but she only takes it as needed. She's never had a psychiatric hospitalization. She's had individual and couples counseling but it has been several years ago.  The patient states that it current symptoms are exacerbated by work stress. She has a great deal of data to compile at her job and feels overwhelmed. She often has meltdowns and begins crying at work. At home her daughter has moved in with her baby after her husband left her. The patient and her husband are feeling this financial strain of caring for them as well. The patient states that she has crying spells and feels depressed she eats too muchShe has never been suicidal or engaged in self-harm. She does not use drugs or alcohol. Her symptoms of depression irritability are much worse around the time of her menses. She suffers from migraine headaches which seem hormonally related as well. She also has fibromyalgia and restless leg syndrome and overall has little energy to do much of anything  The patient returns after 4 months. She had a difficult summer as both her parents were very sick and in and out of the hospital. Fortunately they're doing better now. Her daughter is doing better is working and taking care of her child. [name] mood has been stable on the Wellbutrin and she really  enjoys her job. Review of Systems  Constitutional: Positive for fatigue.  Eyes: Negative.   Respiratory: Negative.   Cardiovascular: Negative.   Gastrointestinal: Negative.   Endocrine: Negative.   Genitourinary: Negative.   Musculoskeletal: Positive for myalgias, arthralgias and neck pain.  Skin: Negative.   Allergic/Immunologic: Negative.   Neurological: Positive for syncope and headaches.  Hematological: Negative.    Psychiatric/Behavioral: Positive for depression, sleep disturbance and dysphoric mood. The patient is nervous/anxious.    Physical Exam  Depressive Symptoms: depressed mood, anhedonia, psychomotor agitation, fatigue, difficulty concentrating, impaired memory, anxiety, panic attacks, disturbed sleep, weight gain,  (Hypo) Manic Symptoms:   Elevated Mood:  No Irritable Mood:  Yes Grandiosity:  No Distractibility:  No Labiality of Mood:  Yes Delusions:  No Hallucinations:  No Impulsivity:  No Sexually Inappropriate Behavior:  No Financial Extravagance:  No Flight of Ideas:  No  Anxiety Symptoms: Excessive Worry:  Yes Panic Symptoms:  Yes Agoraphobia:  No Obsessive Compulsive: Yes  Symptoms: Handwashing, Obsessive worrying, checking doors and windows Specific Phobias:  No Social Anxiety:  No  Psychotic Symptoms:  Hallucinations: No None Delusions:  No Paranoia:  No   Ideas of Reference:  No  PTSD Symptoms: Ever had a traumatic exposure:  No Had a traumatic exposure in the last month:  No Re-experiencing: No None Hypervigilance:  No Hyperarousal: No None Avoidance: No None  Traumatic Brain Injury: No   Past Psychiatric History: Diagnosis: Maj. depression, generalized anxiety disorder, OCD   Hospitalizations: none  Outpatient Care: Has seen 2 psychiatrists in [place] the past   Substance Abuse Care: none  Self-Mutilation: none  Suicidal Attempts: none  Violent Behaviors: none   Past Medical History:   Past Medical History  Diagnosis Date  . Vertigo   . Depression   . Urticaria, chronic   . IBS (irritable bowel syndrome)   . Allergy     seasonal  . Gastritis     history  . Anxiety   . SVD (spontaneous vaginal delivery)     x 2  . GERD (gastroesophageal reflux disease)     otc med prn  . Migraine   . Fibromyalgia    History of Loss of Consciousness:  No Seizure History:  No Cardiac History:  No Allergies:   Allergies  Allergen  Reactions  . Cefzil [Cefprozil]     possible hives  . Bee Venom Swelling  . Sulfa Antibiotics Other (See Comments)    Abdominal cramping.   Current Medications:  Current Outpatient Prescriptions  Medication Sig Dispense Refill  . ALPRAZolam (XANAX) 1 MG tablet Take 1 tablet (1 mg total) by mouth daily as needed for anxiety. 30 tablet 2  . buPROPion (WELLBUTRIN SR) 150 MG 12 hr tablet Take 1 tablet (150 mg total) by mouth 2 (two) times daily. 60 tablet 2  . diazepam (VALIUM) 5 MG tablet TAKE ONE TABLET BY MOUTH EVERY 6 HOURS AS NEEDED FOR DIZZINESS. 30 tablet 0  . Evening Primrose Oil 1000 MG CAPS Take 1 capsule by mouth 3 (three) times daily as needed. During Menstrual Cycle    . meclizine (ANTIVERT) 25 MG tablet TAKE ONE TABLET BY MOUTH THREE TIMES DAILY AS NEEDED FOR DIZZINESS. 30 tablet 0  . NON FORMULARY Taking clabetazol Cream twice a week    . pramipexole (MIRAPEX) 0.75 MG tablet Take 1 tablet (0.75 mg total) by mouth at bedtime. 30 tablet 5  . RELPAX 40 MG tablet TAKE 1 TABLET BY MOUTH AT ONSET OF MIGRAINE,  MAY REPEAT ONCE IN 2 HOURS LATER. NO MORE THAN 2 A DAY. 8 tablet 0  . terconazole (TERAZOL 7) 0.4 % vaginal cream INSERT 1 APPLICATORFUL VAGINALLY EVERY NIGHT AT BEDTIME 45 g 0   No current facility-administered medications for this visit.    Previous Psychotropic Medications:  Medication Dose   Zoloft, Celexa, Effexor, Prozac, Provigil, Wellbutrin, amitriptyline, imipramine, lorazepam                        Substance Abuse History in the last 12 months: Substance Age of 1st Use Last Use Amount Specific Type  Nicotine      Alcohol      Cannabis      Opiates      Cocaine      Methamphetamines      LSD      Ecstasy      Benzodiazepines      Caffeine      Inhalants      Others:                          Medical Consequences of Substance Abuse: none  Legal Consequences of Substance Abuse: none  Family Consequences of Substance Abuse: none  Blackouts:   No DT's:  No Withdrawal Symptoms:  No None  Social History: Current Place of Residence: [place] [address] of Birth: [place] Family Members: Parents 1 brother, another brother deceased Marital Status:  Married Children:   Sons: 1  Daughters: 1 Relationships:  Education:  Corporate treasurer Problems/Performance: none Religious Beliefs/Practices: Christian History of Abuse: none Armed forces technical officer; Geophysicist/field seismologist, Chief Executive Officer History:  None. Legal History: none Hobbies/Interests: Reading, furniture refinishing  Family History:   Family History  Problem Relation Age of Onset  . Depression Mother   . Anxiety disorder Mother   . Anxiety disorder Brother   . Depression Brother   . Bipolar disorder Maternal Uncle   . Anxiety disorder Paternal Grandfather   . Depression Paternal Grandfather   . Bipolar disorder Cousin   . Alcohol abuse Brother   . Drug abuse Brother     Mental Status Examination/Evaluation: Objective:  Appearance: Neat and Well Groomed  Eye Contact::  Good  Speech:  Clear and Coherent  Volume:  Normal  Mood: Fairly good   Affect: Congruent   Thought Process:  Goal Directed  Orientation:  Full (Time, Place, and Person)  Thought Content: Within normal limits   Suicidal Thoughts:  No  Homicidal Thoughts:  No  Judgement:  Good  Insight:  Good  Psychomotor Activity:  Normal  Akathisia:  No  Handed:  Right  AIMS (if indicated):    Assets:  Communication Skills Desire for Improvement Financial Resources/Insurance Social Support Vocational/Educational    Laboratory/X-Ray Psychological Evaluation(s)   Reviewed in the chart      Assessment:  Axis I: Depressive Disorder NOS, Generalized Anxiety Disorder and Obsessive Compulsive Disorder  AXIS I Depressive Disorder NOS, Generalized Anxiety Disorder and Obsessive Compulsive Disorder  AXIS II Deferred  AXIS III Past Medical History  Diagnosis Date   . Vertigo   . Depression   . Urticaria, chronic   . IBS (irritable bowel syndrome)   . Allergy     seasonal  . Gastritis     history  . Anxiety   . SVD (spontaneous vaginal delivery)     x 2  . GERD (gastroesophageal reflux disease)  otc med prn  . Migraine   . Fibromyalgia      AXIS IV other psychosocial or environmental problems  AXIS V 51-60 moderate symptoms   Treatment Plan/Recommendations:  Plan of Care: Medication management   Laboratory:    Psychotherapy: This was recommended but she declined at this time   Medications: She'll continue  Wellbutrin SR 150 mg twice a day   Routine PRN Medications:  No  Consultations:   Safety Concerns:  She denies thoughts of self-harm   Other:  She'll return in 3 months     Diannia Ruder, MD 10/31/20163:21 PM

## 2014-12-18 ENCOUNTER — Telehealth: Payer: Self-pay | Admitting: Family Medicine

## 2014-12-18 ENCOUNTER — Ambulatory Visit: Payer: BC Managed Care – PPO | Admitting: Podiatry

## 2014-12-18 MED ORDER — FLUCONAZOLE 150 MG PO TABS: ORAL_TABLET | ORAL | Status: DC

## 2014-12-18 NOTE — Telephone Encounter (Signed)
Pt states she needs x2 diflucan for a yeast infection she is getting please  [place] [facility]

## 2014-12-24 ENCOUNTER — Ambulatory Visit (HOSPITAL_COMMUNITY): Payer: Self-pay | Admitting: Psychiatry

## 2014-12-29 ENCOUNTER — Other Ambulatory Visit: Payer: Self-pay | Admitting: Family Medicine

## 2014-12-31 ENCOUNTER — Encounter (HOSPITAL_COMMUNITY): Payer: Self-pay | Admitting: Psychiatry

## 2014-12-31 ENCOUNTER — Ambulatory Visit (INDEPENDENT_AMBULATORY_CARE_PROVIDER_SITE_OTHER): Payer: BC Managed Care – PPO | Admitting: Psychiatry

## 2014-12-31 VITALS — BP 110/60 | Ht 67.0 in | Wt 194.0 lb

## 2014-12-31 DIAGNOSIS — F329 Major depressive disorder, single episode, unspecified: Secondary | ICD-10-CM | POA: Diagnosis not present

## 2014-12-31 DIAGNOSIS — F429 Obsessive-compulsive disorder, unspecified: Secondary | ICD-10-CM | POA: Diagnosis not present

## 2014-12-31 DIAGNOSIS — F32A Depression, unspecified: Secondary | ICD-10-CM

## 2014-12-31 DIAGNOSIS — F411 Generalized anxiety disorder: Secondary | ICD-10-CM

## 2014-12-31 MED ORDER — BUPROPION HCL ER (SR) 150 MG PO TB12: 150.0000 mg | ORAL_TABLET | Freq: Two times a day (BID) | ORAL | Status: DC

## 2014-12-31 MED ORDER — FLUOXETINE HCL 20 MG PO CAPS: 20.0000 mg | ORAL_CAPSULE | Freq: Every day | ORAL | Status: DC

## 2014-12-31 NOTE — Patient Instructions (Signed)
Try the [facility] [place]

## 2014-12-31 NOTE — Progress Notes (Signed)
Patient ID: Sheri Wood, female   DOB: 10/14/1970, 44 y.o.   MRN: [id number] Patient ID: VICTORIAN GUNN, female   DOB: 11/03/1970, 44 y.o.   MRN: [id number] Patient ID: PORSHIA BLIZZARD, female   DOB: 08-Nov-1970, 44 y.o.   MRN: [id number] Patient ID: ANINA SCHNAKE, female   DOB: 12-08-1970, 44 y.o.   MRN: [id number] Patient ID: SHANISE BALCH, female   DOB: [birth year], 44 y.o.   MRN: [id number] Patient ID: JASE HIMMELBERGER, female   DOB: [birth year], 44 y.o.   MRN: [id number]  Psychiatric Assessment Adult  Patient Identification:  SILVANA HOLECEK Date of Evaluation:  12/31/2014 Chief Complaint: "I'm doing better" History of Chief Complaint:   Chief Complaint  Patient presents with  . Depression  . Anxiety  . Follow-up    Depression        Associated symptoms include fatigue, myalgias and headaches.  Past medical history includes anxiety.   Anxiety Symptoms include nervous/anxious behavior.     this patient is a 44 year old married white female who lives with her husband 43 year old daughter, 13-month-old grandson and 39 year old son in [place]. Her son is actually in college at [employer]. 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  The patient was referred by Dr. Gerda Diss, her primary physician for further assessment and treatment of depression and anxiety.  The patient states she's had difficulties with depression and anxiety beginning about 18 years ago. She's always been a somewhat obsessional worried person he has a child. In childhood she would do rituals like counting and touching things in a certain way. She got older and had her own children she began to worry constantly about their safety and about germs.  When she was about 44 years old she started having panic attacks and depression. She had seen her primary doctor as well as Dr. Jennelle Human and Dr. Duard Brady, both psychiatrist in [place]. She's been tried on numerous antidepressants  including Zoloft, Celexa, Effexor, Prozac, Wellbutrin and amitriptyline. We will work for a while and then stop. Since getting on these medicines she's also gained 60 pounds. Most recently she was tried on imipramine. When she got up to the 50 mg dosage she was unable to sleep. She also takes lorazepam which helps some with anxiety but she only takes it as needed. She's never had a psychiatric hospitalization. She's had individual and couples counseling but it has been several years ago.  The patient states that it current symptoms are exacerbated by work stress. She has a great deal of data to compile at her job and feels overwhelmed. She often has meltdowns and begins crying at work. At home her daughter has moved in with her baby after her husband left her. The patient and her husband are feeling this financial strain of caring for them as well. The patient states that she has crying spells and feels depressed she eats too muchShe has never been suicidal or engaged in self-harm. She does not use drugs or alcohol. Her symptoms of depression irritability are much worse around the time of her menses. She suffers from migraine headaches which seem hormonally related as well. She also has fibromyalgia and restless leg syndrome and overall has little energy to do much of anything  The patient returns after 3 weeks. She states she is not doing that well. She's very anxious and her OCD symptoms are worse. Her energy is low and she can't do a lot of walking due  to planter fasciitis even though the exercise helps her anxiety. In the past she been on a combination of Prozac and Wellbutrin in the higher dose of Prozac made her very sleepy but she would like to try the lower dose again and I think this is a reasonable idea. Review of Systems  Constitutional: Positive for fatigue.  Eyes: Negative.   Respiratory: Negative.   Cardiovascular: Negative.   Gastrointestinal: Negative.   Endocrine: Negative.    Genitourinary: Negative.   Musculoskeletal: Positive for myalgias, arthralgias and neck pain.  Skin: Negative.   Allergic/Immunologic: Negative.   Neurological: Positive for syncope and headaches.  Hematological: Negative.   Psychiatric/Behavioral: Positive for depression, sleep disturbance and dysphoric mood. The patient is nervous/anxious.    Physical Exam  Depressive Symptoms: depressed mood, anhedonia, psychomotor agitation, fatigue, difficulty concentrating, impaired memory, anxiety, panic attacks, disturbed sleep, weight gain,  (Hypo) Manic Symptoms:   Elevated Mood:  No Irritable Mood:  Yes Grandiosity:  No Distractibility:  No Labiality of Mood:  Yes Delusions:  No Hallucinations:  No Impulsivity:  No Sexually Inappropriate Behavior:  No Financial Extravagance:  No Flight of Ideas:  No  Anxiety Symptoms: Excessive Worry:  Yes Panic Symptoms:  Yes Agoraphobia:  No Obsessive Compulsive: Yes  Symptoms: Handwashing, Obsessive worrying, checking doors and windows Specific Phobias:  No Social Anxiety:  No  Psychotic Symptoms:  Hallucinations: No None Delusions:  No Paranoia:  No   Ideas of Reference:  No  PTSD Symptoms: Ever had a traumatic exposure:  No Had a traumatic exposure in the last month:  No Re-experiencing: No None Hypervigilance:  No Hyperarousal: No None Avoidance: No None  Traumatic Brain Injury: No   Past Psychiatric History: Diagnosis: Maj. depression, generalized anxiety disorder, OCD   Hospitalizations: none  Outpatient Care: Has seen 2 psychiatrists in [place] the past   Substance Abuse Care: none  Self-Mutilation: none  Suicidal Attempts: none  Violent Behaviors: none   Past Medical History:   Past Medical History  Diagnosis Date  . Vertigo   . Depression   . Urticaria, chronic   . IBS (irritable bowel syndrome)   . Allergy     seasonal  . Gastritis     history  . Anxiety   . SVD (spontaneous vaginal delivery)      x 2  . GERD (gastroesophageal reflux disease)     otc med prn  . Migraine   . Fibromyalgia    History of Loss of Consciousness:  No Seizure History:  No Cardiac History:  No Allergies:   Allergies  Allergen Reactions  . Cefzil [Cefprozil]     possible hives  . Bee Venom Swelling  . Sulfa Antibiotics Other (See Comments)    Abdominal cramping.   Current Medications:  Current Outpatient Prescriptions  Medication Sig Dispense Refill  . ALPRAZolam (XANAX) 1 MG tablet Take 1 tablet (1 mg total) by mouth daily as needed for anxiety. 30 tablet 2  . buPROPion (WELLBUTRIN SR) 150 MG 12 hr tablet Take 1 tablet (150 mg total) by mouth 2 (two) times daily. 60 tablet 2  . diazepam (VALIUM) 5 MG tablet TAKE ONE TABLET BY MOUTH EVERY 6 HOURS AS NEEDED FOR DIZZINESS. 30 tablet 0  . Evening Primrose Oil 1000 MG CAPS Take 1 capsule by mouth 3 (three) times daily as needed. During Menstrual Cycle    . fluconazole (DIFLUCAN) 150 MG tablet Take 1 tablet three days apart 2 tablet 0  . FLUoxetine (PROZAC) 20 MG  capsule Take 1 capsule (20 mg total) by mouth daily. 30 capsule 2  . meclizine (ANTIVERT) 25 MG tablet TAKE ONE TABLET BY MOUTH THREE TIMES DAILY AS NEEDED FOR DIZZINESS. 30 tablet 0  . NON FORMULARY Taking clabetazol Cream twice a week    . pramipexole (MIRAPEX) 0.75 MG tablet Take 1 tablet (0.75 mg total) by mouth at bedtime. 30 tablet 5  . pramipexole (MIRAPEX) 1.5 MG tablet TAKE 1/2 TABLET BY MOUTH AT BEDTIME. 15 tablet 1  . RELPAX 40 MG tablet TAKE 1 TABLET BY MOUTH AT ONSET OF MIGRAINE, MAY REPEAT ONCE IN 2 HOURS LATER. NO MORE THAN 2 A DAY. 8 tablet 0  . terconazole (TERAZOL 7) 0.4 % vaginal cream INSERT 1 APPLICATORFUL VAGINALLY EVERY NIGHT AT BEDTIME 45 g 0   No current facility-administered medications for this visit.    Previous Psychotropic Medications:  Medication Dose   Zoloft, Celexa, Effexor, Prozac, Provigil, Wellbutrin, amitriptyline, imipramine, lorazepam                         Substance Abuse History in the last 12 months: Substance Age of 1st Use Last Use Amount Specific Type  Nicotine      Alcohol      Cannabis      Opiates      Cocaine      Methamphetamines      LSD      Ecstasy      Benzodiazepines      Caffeine      Inhalants      Others:                          Medical Consequences of Substance Abuse: none  Legal Consequences of Substance Abuse: none  Family Consequences of Substance Abuse: none  Blackouts:  No DT's:  No Withdrawal Symptoms:  No None  Social History: Current Place of Residence: [place] [address] of Birth: [place] Family Members: Parents 1 brother, another brother deceased Marital Status:  Married Children:   Sons: 1  Daughters: 1 Relationships:  Education:  Corporate treasurer Problems/Performance: none Religious Beliefs/Practices: Christian History of Abuse: none Armed forces technical officer; Geophysicist/field seismologist, Chief Executive Officer History:  None. Legal History: none Hobbies/Interests: Reading, furniture refinishing  Family History:   Family History  Problem Relation Age of Onset  . Depression Mother   . Anxiety disorder Mother   . Anxiety disorder Brother   . Depression Brother   . Bipolar disorder Maternal Uncle   . Anxiety disorder Paternal Grandfather   . Depression Paternal Grandfather   . Bipolar disorder Cousin   . Alcohol abuse Brother   . Drug abuse Brother     Mental Status Examination/Evaluation: Objective:  Appearance: Neat and Well Groomed  Eye Contact::  Good  Speech:  Clear and Coherent  Volume:  Normal  Mood: Anxious   Affect: Congruent   Thought Process:  Goal Directed  Orientation:  Full (Time, Place, and Person)  Thought Content: Rumination and worries   Suicidal Thoughts:  No  Homicidal Thoughts:  No  Judgement:  Good  Insight:  Good  Psychomotor Activity:  Normal  Akathisia:  No  Handed:  Right  AIMS (if indicated):     Assets:  Communication Skills Desire for Improvement Financial Resources/Insurance Social Support Vocational/Educational    Laboratory/X-Ray Psychological Evaluation(s)   Reviewed in the chart      Assessment:  Axis  I: Depressive Disorder NOS, Generalized Anxiety Disorder and Obsessive Compulsive Disorder  AXIS I Depressive Disorder NOS, Generalized Anxiety Disorder and Obsessive Compulsive Disorder  AXIS II Deferred  AXIS III Past Medical History  Diagnosis Date  . Vertigo   . Depression   . Urticaria, chronic   . IBS (irritable bowel syndrome)   . Allergy     seasonal  . Gastritis     history  . Anxiety   . SVD (spontaneous vaginal delivery)     x 2  . GERD (gastroesophageal reflux disease)     otc med prn  . Migraine   . Fibromyalgia      AXIS IV other psychosocial or environmental problems  AXIS V 51-60 moderate symptoms   Treatment Plan/Recommendations:  Plan of Care: Medication management   Laboratory:    Psychotherapy: This was recommended but she declined at this time   Medications: She'll continue  Wellbutrin SR 150 mg twice a day . she will start back on Prozac 20 mg daily. She does have Xanax available if needed for anxiety   Routine PRN Medications:  No  Consultations:   Safety Concerns:  She denies thoughts of self-harm   Other:  She'll return in 6 weeks     Diannia Ruder, MD 11/23/201611:59 AM

## 2015-01-13 ENCOUNTER — Other Ambulatory Visit: Payer: Self-pay | Admitting: Nurse Practitioner

## 2015-02-06 ENCOUNTER — Other Ambulatory Visit: Payer: Self-pay | Admitting: Nurse Practitioner

## 2015-02-08 HISTORY — PX: FOOT SURGERY: SHX648

## 2015-02-13 ENCOUNTER — Ambulatory Visit (HOSPITAL_COMMUNITY): Payer: Self-pay | Admitting: Psychiatry

## 2015-03-02 ENCOUNTER — Other Ambulatory Visit: Payer: Self-pay | Admitting: Family Medicine

## 2015-03-04 ENCOUNTER — Encounter: Payer: Self-pay | Admitting: Family Medicine

## 2015-03-04 ENCOUNTER — Ambulatory Visit (INDEPENDENT_AMBULATORY_CARE_PROVIDER_SITE_OTHER): Payer: BC Managed Care – PPO | Admitting: Family Medicine

## 2015-03-04 VITALS — BP 106/70 | Temp 97.6°F | Ht 67.0 in | Wt 194.2 lb

## 2015-03-04 DIAGNOSIS — J019 Acute sinusitis, unspecified: Secondary | ICD-10-CM

## 2015-03-04 DIAGNOSIS — B9689 Other specified bacterial agents as the cause of diseases classified elsewhere: Secondary | ICD-10-CM

## 2015-03-04 MED ORDER — FLUCONAZOLE 200 MG PO TABS: 200.0000 mg | ORAL_TABLET | Freq: Every day | ORAL | Status: DC

## 2015-03-04 MED ORDER — DOXYCYCLINE HYCLATE 100 MG PO CAPS: 100.0000 mg | ORAL_CAPSULE | Freq: Two times a day (BID) | ORAL | Status: DC

## 2015-03-04 NOTE — Progress Notes (Signed)
   Subjective:    Patient ID: Sheri Wood, female    DOB: 19-Dec-1970, 45 y.o.   MRN: [id number]  Sinusitis This is a new problem. The current episode started 1 to 4 weeks ago. Associated symptoms include coughing, ear pain, headaches, sinus pressure and a sore throat. Treatments tried: Ibuprofen, Mucinex D, Humidifier    Patient states no other concerns this visit.  patient is been sick for a couple weeks got better for a few days then got worse with head congestion to taking sinus pressure and pain  Review of Systems  HENT: Positive for ear pain, sinus pressure and sore throat.   Respiratory: Positive for cough.   Neurological: Positive for headaches.       Objective:   Physical Exam   eardrums are normal throat is normal moderate maxillary sinus tenderness frontal sinus nontender neck no masses lungs clear no crackles heart regular no murmurs      Assessment & Plan:   sinusitis antibiotics prescribed Patient was seen today for upper respiratory illness. It is felt that the patient is dealing with sinusitis. Antibiotics were prescribed today. Importance of compliance with medication was discussed. Symptoms should gradually resolve over the course of the next several days. If high fevers, progressive illness, difficulty breathing, worsening condition or failure for symptoms to improve over the next several days then the patient is to follow-up. If any emergent conditions the patient is to follow-up in the emergency department otherwise to follow-up in the office.  Should gradually get better follow-up if worse

## 2015-03-04 NOTE — Patient Instructions (Signed)

## 2015-03-06 ENCOUNTER — Other Ambulatory Visit: Payer: Self-pay | Admitting: *Deleted

## 2015-03-06 ENCOUNTER — Telehealth: Payer: Self-pay | Admitting: Family Medicine

## 2015-03-06 MED ORDER — HYDROCODONE-ACETAMINOPHEN 5-325 MG PO TABS: ORAL_TABLET | ORAL | Status: DC

## 2015-03-06 NOTE — Telephone Encounter (Signed)
Pt was seen Wednesday for a sinus inf and is having bad headaches with it. Pt is wanting to know if something can be called in for the headaches.      [employer]

## 2015-03-06 NOTE — Telephone Encounter (Signed)
Having sinus pain, a bad headache, pain across face and tmj pain and nausea. Would like something called in for pain.

## 2015-03-06 NOTE — Telephone Encounter (Signed)
hydrocod 5/325 numb sixteen one q four to six hrs prn pain

## 2015-03-06 NOTE — Telephone Encounter (Signed)
Script ready for pickup. Pt notified.  

## 2015-03-10 ENCOUNTER — Other Ambulatory Visit: Payer: Self-pay | Admitting: Nurse Practitioner

## 2015-03-19 ENCOUNTER — Ambulatory Visit (INDEPENDENT_AMBULATORY_CARE_PROVIDER_SITE_OTHER): Payer: BC Managed Care – PPO

## 2015-03-19 ENCOUNTER — Ambulatory Visit (INDEPENDENT_AMBULATORY_CARE_PROVIDER_SITE_OTHER): Payer: BC Managed Care – PPO | Admitting: Podiatry

## 2015-03-19 ENCOUNTER — Encounter: Payer: Self-pay | Admitting: Podiatry

## 2015-03-19 VITALS — BP 86/54 | HR 77 | Resp 16

## 2015-03-19 DIAGNOSIS — M21619 Bunion of unspecified foot: Secondary | ICD-10-CM | POA: Diagnosis not present

## 2015-03-19 DIAGNOSIS — M79671 Pain in right foot: Secondary | ICD-10-CM | POA: Diagnosis not present

## 2015-03-19 DIAGNOSIS — M779 Enthesopathy, unspecified: Secondary | ICD-10-CM

## 2015-03-19 DIAGNOSIS — M722 Plantar fascial fibromatosis: Secondary | ICD-10-CM | POA: Diagnosis not present

## 2015-03-19 MED ORDER — TRIAMCINOLONE ACETONIDE 10 MG/ML IJ SUSP
10.0000 mg | Freq: Once | INTRAMUSCULAR | Status: AC
  Administered 2015-03-19: 10 mg

## 2015-03-19 NOTE — Progress Notes (Signed)
Subjective:     Patient ID: Sheri Wood, female   DOB: 1970-04-08, 45 y.o.   MRN: [id number]  HPI patient states that both my feet are really hurting and I'm having a lot of pain in the bunion right in the heel right and in the second joint left that I did get several weeks of relief after you put the injection but it's very tender. Patient states she's also having trouble with her thumbs and she's can have blood work done to rule out arthritis next week   Review of Systems     Objective:   Physical Exam Neurovascular status intact muscle strength adequate with structural bunion deformity right with pain around the joint and discomfort in the plantar heel right and exquisite discomfort in the second MPJ left with swelling    Assessment:     Difficult to tell whether this is inflammatory systemic or local condition and today under to focus on the inflammatory condition around the first MPJ and injected 3 mg Kenalog 5 mg Xylocaine and then discussed possibility for bunion heel and metatarsal correction depending on results of blood work. We will see the results of the medication I put ahead and I will reevaluate her again in 2 weeks    Plan:     The plan is in the above area

## 2015-03-23 ENCOUNTER — Encounter: Payer: Self-pay | Admitting: Nurse Practitioner

## 2015-03-23 ENCOUNTER — Ambulatory Visit (INDEPENDENT_AMBULATORY_CARE_PROVIDER_SITE_OTHER): Payer: BC Managed Care – PPO | Admitting: Nurse Practitioner

## 2015-03-23 VITALS — BP 130/70 | Ht 67.0 in | Wt 195.0 lb

## 2015-03-23 DIAGNOSIS — L659 Nonscarring hair loss, unspecified: Secondary | ICD-10-CM

## 2015-03-23 DIAGNOSIS — R5383 Other fatigue: Secondary | ICD-10-CM

## 2015-03-23 MED ORDER — NALTREXONE-BUPROPION HCL ER 8-90 MG PO TB12: ORAL_TABLET | ORAL | Status: DC

## 2015-03-23 NOTE — Patient Instructions (Signed)
Sulfate free shampoo Biotin   

## 2015-03-24 ENCOUNTER — Encounter: Payer: Self-pay | Admitting: Nurse Practitioner

## 2015-03-24 LAB — CBC WITH DIFFERENTIAL/PLATELET
Basophils Absolute: 0 10*3/uL (ref 0.0–0.2)
Basos: 0 %
EOS (ABSOLUTE): 0.3 10*3/uL (ref 0.0–0.4)
Eos: 3 %
Hematocrit: 43.1 % (ref 34.0–46.6)
Hemoglobin: 14.4 g/dL (ref 11.1–15.9)
Immature Grans (Abs): 0 10*3/uL (ref 0.0–0.1)
Immature Granulocytes: 0 %
Lymphocytes Absolute: 2.6 10*3/uL (ref 0.7–3.1)
Lymphs: 26 %
MCH: 30.1 pg (ref 26.6–33.0)
MCHC: 33.4 g/dL (ref 31.5–35.7)
MCV: 90 fL (ref 79–97)
Monocytes Absolute: 0.7 10*3/uL (ref 0.1–0.9)
Monocytes: 7 %
Neutrophils Absolute: 6.4 10*3/uL (ref 1.4–7.0)
Neutrophils: 64 %
Platelets: 300 10*3/uL (ref 150–379)
RBC: 4.78 x10E6/uL (ref 3.77–5.28)
RDW: 13.5 % (ref 12.3–15.4)
WBC: 10 10*3/uL (ref 3.4–10.8)

## 2015-03-24 LAB — VITAMIN D 25 HYDROXY (VIT D DEFICIENCY, FRACTURES): Vit D, 25-Hydroxy: 30.7 ng/mL (ref 30.0–100.0)

## 2015-03-24 LAB — TSH: TSH: 1.94 u[IU]/mL (ref 0.450–4.500)

## 2015-03-24 NOTE — Progress Notes (Signed)
Subjective:  Presents to discuss weight loss options. Dr. Tenny Craw has prescribed Vyvanse for binge eating, this did not work well. Has migraines when she takes phentermine. Generalized fatigue. Has also noticed some hair loss particularly in the frontal area. Has had female family members with similar hair loss. Has had endometrial ablation. Continues to have significant stress.  Objective:   BP 130/70 mmHg  Ht  (1.702 m)  Wt 195 lb (88.451 kg)  BMI 30.53 kg/m2 NAD. Alert, oriented. Lungs clear. Heart regular rate rhythm. Thyroid normal limit to palpation. No obvious hair loss noted.  Assessment: Other fatigue - Plan: TSH, VITAMIN D 25 Hydroxy (Vit-D Deficiency, Fractures), CBC with Differential/Platelet  Hair loss - Plan: TSH, VITAMIN D 25 Hydroxy (Vit-D Deficiency, Fractures), CBC with Differential/Platelet  Morbid obesity due to excess calories (HCC)  Plan:  Meds ordered this encounter  Medications  . Naltrexone-Bupropion HCl ER 8-90 MG TB12    Sig: One po qam x 1 week then one po BID x 1 week then 2 po qam and one po qpm x 1 week then 2 po BID    Dispense:  90 tablet    Refill:  0    Order Specific Question:  Supervising Provider    Answer:  Merlyn Albert [[id number]]  . Naltrexone-Bupropion HCl ER (CONTRAVE) 8-90 MG TB12    Sig: 2 po BID    Dispense:  120 tablet    Refill:  1    Order Specific Question:  Supervising Provider    Answer:  Merlyn Albert [[id number]]   Discussed potential causes for fatigue and hair loss, labs pending. Also hormones and/or stress may be factors. Reviewed potential adverse effects of contrast, DC med and call if any problems. Otherwise follow-up in 3 months.

## 2015-03-25 ENCOUNTER — Encounter: Payer: Self-pay | Admitting: Nurse Practitioner

## 2015-03-31 ENCOUNTER — Encounter: Payer: Self-pay | Admitting: Nurse Practitioner

## 2015-04-02 ENCOUNTER — Encounter: Payer: Self-pay | Admitting: Podiatry

## 2015-04-02 ENCOUNTER — Other Ambulatory Visit (HOSPITAL_COMMUNITY)
Admission: RE | Admit: 2015-04-02 | Discharge: 2015-04-02 | Disposition: A | Discharge status: Home / Self Care | Payer: BC Managed Care – PPO | Source: Ambulatory Visit | Attending: Family Medicine | Admitting: Family Medicine

## 2015-04-02 ENCOUNTER — Ambulatory Visit (INDEPENDENT_AMBULATORY_CARE_PROVIDER_SITE_OTHER): Payer: BC Managed Care – PPO | Admitting: Podiatry

## 2015-04-02 ENCOUNTER — Encounter: Payer: Self-pay | Admitting: Family Medicine

## 2015-04-02 ENCOUNTER — Ambulatory Visit (INDEPENDENT_AMBULATORY_CARE_PROVIDER_SITE_OTHER): Payer: BC Managed Care – PPO | Admitting: Family Medicine

## 2015-04-02 VITALS — BP 112/80 | Temp 98.6°F | Ht 67.0 in | Wt 197.4 lb

## 2015-04-02 VITALS — BP 96/68 | HR 74 | Resp 16

## 2015-04-02 DIAGNOSIS — R0789 Other chest pain: Secondary | ICD-10-CM | POA: Diagnosis not present

## 2015-04-02 DIAGNOSIS — M779 Enthesopathy, unspecified: Secondary | ICD-10-CM | POA: Diagnosis not present

## 2015-04-02 DIAGNOSIS — Z13220 Encounter for screening for lipoid disorders: Secondary | ICD-10-CM | POA: Diagnosis not present

## 2015-04-02 DIAGNOSIS — M722 Plantar fascial fibromatosis: Secondary | ICD-10-CM

## 2015-04-02 DIAGNOSIS — M21619 Bunion of unspecified foot: Secondary | ICD-10-CM | POA: Diagnosis not present

## 2015-04-02 LAB — LIPID PANEL
Cholesterol: 192 mg/dL (ref 0–200)
HDL: 60 mg/dL (ref >40)
LDL Cholesterol: 118 mg/dL — ABNORMAL HIGH (ref 0–99)
Total CHOL/HDL Ratio: 3.2 RATIO
Triglycerides: 69 mg/dL (ref <150)
VLDL: 14 mg/dL (ref 0–40)

## 2015-04-02 LAB — TROPONIN I: Troponin I: <0.03 ng/mL (ref <0.031)

## 2015-04-02 MED ORDER — DICLOFENAC SODIUM 75 MG PO TBEC: 75.0000 mg | DELAYED_RELEASE_TABLET | Freq: Two times a day (BID) | ORAL | Status: DC

## 2015-04-02 MED ORDER — PANTOPRAZOLE SODIUM 40 MG PO TBEC: 40.0000 mg | DELAYED_RELEASE_TABLET | Freq: Every day | ORAL | Status: DC

## 2015-04-02 NOTE — Patient Instructions (Signed)
Pre-Operative Instructions  Congratulations, you have decided to take an important step to improving your quality of life.  You can be assured that the doctors of [facility] will be with you every step of the way.  1. Plan to be at the surgery center/hospital at least 1 (one) hour prior to your scheduled time unless otherwise directed by the surgical center/hospital staff.  You must have a responsible adult accompany you, remain during the surgery and drive you home.  Make sure you have directions to the surgical center/hospital and know how to get there on time. 2. For hospital based surgery you will need to obtain a history and physical form from your family physician within 1 month prior to the date of surgery- we will give you a form for you primary physician.  3. We make every effort to accommodate the date you request for surgery.  There are however, times where surgery dates or times have to be moved.  We will contact you as soon as possible if a change in schedule is required.   4. No Aspirin/Ibuprofen for one week before surgery.  If you are on aspirin, any non-steroidal anti-inflammatory medications (Mobic, Aleve, Ibuprofen) you should stop taking it 7 days prior to your surgery.  You make take Tylenol  For pain prior to surgery.  5. Medications- If you are taking daily heart and blood pressure medications, seizure, reflux, allergy, asthma, anxiety, pain or diabetes medications, make sure the surgery center/hospital is aware before the day of surgery so they may notify you which medications to take or avoid the day of surgery. 6. No food or drink after midnight the night before surgery unless directed otherwise by surgical center/hospital staff. 7. No alcoholic beverages 24 hours prior to surgery.  No smoking 24 hours prior to or 24 hours after surgery. 8. Wear loose pants or shorts- loose enough to fit over bandages, boots, and casts. 9. No slip on shoes, sneakers are best. 10. Bring  your boot with you to the surgery center/hospital.  Also bring crutches or a walker if your physician has prescribed it for you.  If you do not have this equipment, it will be provided for you after surgery. 11. If you have not been contracted by the surgery center/hospital by the day before your surgery, call to confirm the date and time of your surgery. 12. Leave-time from work may vary depending on the type of surgery you have.  Appropriate arrangements should be made prior to surgery with your employer. 13. Prescriptions will be provided immediately following surgery by your doctor.  Have these filled as soon as possible after surgery and take the medication as directed. 14. Remove nail polish on the operative foot. 15. Wash the night before surgery.  The night before surgery wash the foot and leg well with the antibacterial soap provided and water paying special attention to beneath the toenails and in between the toes.  Rinse thoroughly with water and dry well with a towel.  Perform this wash unless told not to do so by your physician.  Enclosed: 1 Ice pack (please put in freezer the night before surgery)   1 Hibiclens skin cleaner   Pre-op Instructions  If you have any questions regarding the instructions, do not hesitate to call our office.  [place]: [address] [place], [place] [address] [phone]  [place]: [address] [place], [place] [address] [phone]  [place]: [address]  [place], [place] [address] [phone]  Dr. Richard   Tuchman DPM, Dr. Arrabella Westerman DPM Dr. Richard Sikora DPM, Dr. M. Todd Hyatt DPM, Dr. Kathryn Egerton DPM 

## 2015-04-02 NOTE — Progress Notes (Signed)
   Subjective:    Patient ID: Sheri Wood, female    DOB: [birth year], 45 y.o.   MRN: [id number]  Chest Pain  This is a new problem. The current episode started in the past 7 days. The problem occurs intermittently. The problem has been unchanged. The pain radiates to the left arm, left shoulder and upper back. Associated symptoms include malaise/fatigue and shortness of breath. The pain is aggravated by nothing. She has tried nothing for the symptoms. The treatment provided no relief. There are no known risk factors.   Patient states that she has no other concerns at this time.   Woke up tue mor with substernal chest pain  Feels also weir d sensation into the left arm and on into the back  Some radiatioin  Notes the pain is aching  And sore Sometimes sharp that hits, other times dull  Starting to walk, no injury or overuse or strain notes   No connection to meals  No major s o b, feels like not being able to take a dep breath  Pt woke up with the pain on tue, and noted aching with walking  No nausea  Smoked sevenn yrs Review of Systems  Constitutional: Positive for malaise/fatigue.  Respiratory: Positive for shortness of breath.   Cardiovascular: Positive for chest pain.       Objective:   Physical Exam  Alert no acute distress. HEENT normal. Lungs clear. Heart regular rhythm. Chest wall mild tenderness to deep palpation   EKG normal sinus rhythm no significant ST-T changes      Assessment & Plan:  Impression chest pain likely chest wall. Discussed. However we need to be sure not cardiac in etiology. Troponin and lipid today. If troponin negative will do cardiology referral. Also will cover Froman reflux with Protonix plan as noted above above further recommendations based on blood work results cardiology referral pending [facility]

## 2015-04-03 ENCOUNTER — Other Ambulatory Visit: Payer: Self-pay | Admitting: Family Medicine

## 2015-04-03 ENCOUNTER — Encounter: Payer: Self-pay | Admitting: Family Medicine

## 2015-04-05 NOTE — Progress Notes (Signed)
Subjective:     Patient ID: Sheri Wood, female   DOB: February 28, 1970, 45 y.o.   MRN: [id number]  HPI patient states I seem to be improving but still having some discomfort   Review of Systems     Objective:   Physical Exam Neurovascular status intact with muscle strength adequate and patient noted to have minimal discomfort noted with mild pain still noted in the left heel    Assessment:     Plantar fasciitis that has improved but still present    Plan:     Reviewed condition and recommended continuation of supportive shoes and not going barefoot and physical therapy. If symptoms persist patient will be seen back

## 2015-04-09 ENCOUNTER — Telehealth: Payer: Self-pay | Admitting: *Deleted

## 2015-04-09 NOTE — Telephone Encounter (Signed)
"  I'm ready to schedule my surgery with Dr. Charlsie Merles."  When would you like to schedule?  "I wanted to do it for the Easter Break but I can't do it.  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  So, I'm going to wait until June when school will be out.  Can we do it mid June?"  He can do it on 07/21/2015.  "That date will be great."  Surgical center will call you Friday or Monday prior to surgery with the arrival time.  "You gave me the things to cleanse my feet.  Will they still be good in June?"  Yes, they will still be good.

## 2015-04-10 ENCOUNTER — Encounter: Payer: Self-pay | Admitting: Family Medicine

## 2015-04-13 ENCOUNTER — Encounter: Payer: Self-pay | Admitting: Nurse Practitioner

## 2015-04-13 NOTE — Telephone Encounter (Signed)
Entered in error

## 2015-04-24 ENCOUNTER — Ambulatory Visit: Payer: Self-pay | Admitting: Internal Medicine

## 2015-05-01 ENCOUNTER — Ambulatory Visit (INDEPENDENT_AMBULATORY_CARE_PROVIDER_SITE_OTHER): Payer: BC Managed Care – PPO | Admitting: Podiatry

## 2015-05-01 ENCOUNTER — Other Ambulatory Visit: Payer: Self-pay | Admitting: Podiatry

## 2015-05-01 ENCOUNTER — Encounter: Payer: Self-pay | Admitting: Podiatry

## 2015-05-01 VITALS — BP 106/49 | HR 85 | Resp 16

## 2015-05-01 DIAGNOSIS — M722 Plantar fascial fibromatosis: Secondary | ICD-10-CM

## 2015-05-01 DIAGNOSIS — M779 Enthesopathy, unspecified: Secondary | ICD-10-CM

## 2015-05-01 DIAGNOSIS — M21619 Bunion of unspecified foot: Secondary | ICD-10-CM

## 2015-05-01 MED ORDER — TRIAMCINOLONE ACETONIDE 10 MG/ML IJ SUSP
10.0000 mg | Freq: Once | INTRAMUSCULAR | Status: AC
  Administered 2015-05-01: 10 mg

## 2015-05-03 NOTE — Progress Notes (Signed)
Subjective:     Patient ID: Sheri GalasJennifer L Wood, female   DOB: 1970/05/23, 45 y.o.   MRN: [id number]  HPI patient presents stating I'm still having some discomfort in my heel and also I am still getting some discomfort in the joint of my forefoot left   Review of Systems     Objective:   Physical Exam Neurovascular status intact muscle strength adequate with diminished discomfort heel but one area that still quite tender with inflammatory capsulitis of the second MPJ    Assessment:     Plantar fasciitis capsulitis-like condition    Plan:     Reviewed both conditions and carefully injected the fascia 3 Milligan Kenalog 5 g Xylocaine and did a forefoot capsular injection after doing a forefoot block and explaining chances for flexor plate stretch with patient and risk. I then aspirated the joint and got out a small amount of clear fluid and injected with a quarter cc deck Smith some Kenalog and advised on reduced activity and reappoint

## 2015-05-06 LAB — RHEUMATOID ARTHRITIS DIAGNOSTIC PANEL, COMPREHENSIVE
Cyclic Citrullin Peptide Ab: <16 Units
Rheumatoid Factor (IgA): <5 U (ref <=6)
Rheumatoid Factor (IgG): <5 U (ref <=6)
Rheumatoid Factor (IgM): <5 U (ref <=6)
SSA (Ro) (ENA) Antibody, IgG: <1
SSB (La) (ENA) Antibody, IgG: <1

## 2015-05-11 ENCOUNTER — Telehealth: Payer: Self-pay | Admitting: *Deleted

## 2015-05-11 NOTE — Telephone Encounter (Signed)
"  I'm returning your call."  Yes, I was calling to see if we can move your surgery from 07/30/2015 to 07/28/2015.  Dr. Charlsie Merlesegal thought he was going to be out of the office on his regular surgery day.  So he wanted to know if it's okay to move your surgery.  "That will be fine.  Do you know what time I will need to be there?"  No, someone from the surgical center will call you a day or two prior to surgery date with the arrival time.  Reason I can't give a time is because they may have cancellations or patients that have medical issues that need to go first.  "Oh, I understand."

## 2015-05-11 NOTE — Telephone Encounter (Signed)
I left patient messages to call me back.  I need to move her surgery from 07/30/2015 to 07/28/2015.  Dr. Charlsie Merlesegal will be doing surgery on 07/28/2015 now, change in his schedule.

## 2015-05-12 ENCOUNTER — Telehealth: Payer: Self-pay | Admitting: *Deleted

## 2015-05-12 NOTE — Telephone Encounter (Signed)
Pt calling for lab results.  Dr. Charlsie Merlesegal reviewed pt's 05/01/2015 blood work as within normal limits.  Informed pt.  Pt states was called and her surgery was rescheduled.

## 2015-05-25 ENCOUNTER — Other Ambulatory Visit: Payer: Self-pay | Admitting: Nurse Practitioner

## 2015-06-01 ENCOUNTER — Other Ambulatory Visit: Payer: Self-pay | Admitting: Family Medicine

## 2015-06-01 ENCOUNTER — Ambulatory Visit: Payer: Self-pay | Admitting: Internal Medicine

## 2015-06-01 ENCOUNTER — Other Ambulatory Visit: Payer: Self-pay | Admitting: Nurse Practitioner

## 2015-06-16 ENCOUNTER — Encounter: Payer: Self-pay | Admitting: Nurse Practitioner

## 2015-06-16 ENCOUNTER — Ambulatory Visit (INDEPENDENT_AMBULATORY_CARE_PROVIDER_SITE_OTHER): Payer: BC Managed Care – PPO | Admitting: Nurse Practitioner

## 2015-06-16 VITALS — BP 110/80 | Temp 98.4°F | Ht 67.0 in | Wt 199.1 lb

## 2015-06-16 DIAGNOSIS — R5383 Other fatigue: Secondary | ICD-10-CM

## 2015-06-16 DIAGNOSIS — R0683 Snoring: Secondary | ICD-10-CM | POA: Diagnosis not present

## 2015-06-16 MED ORDER — PHENTERMINE-TOPIRAMATE ER 7.5-46 MG PO CP24: ORAL_CAPSULE | ORAL | Status: DC

## 2015-06-16 MED ORDER — PHENTERMINE-TOPIRAMATE ER 3.75-23 MG PO CP24: ORAL_CAPSULE | ORAL | Status: DC

## 2015-06-17 ENCOUNTER — Encounter: Payer: Self-pay | Admitting: Nurse Practitioner

## 2015-06-17 NOTE — Progress Notes (Signed)
Subjective:  Presents for c/o continued fatigue. Loud snoring at night to the point her and her husband do not sleep in the same room. [place] questionnaire scores are high. Even though she sleeps all night, she is exhausted. Also had side effects on Contrave. Dr. Tenny Crawoss has restarted Wellbutrin alone. Would like to try Qsymia.  Objective:   BP 110/80 mmHg  Temp(Src) 98.4 F (36.9 C) (Oral)  Ht 5\' 7"  (1.702 m)  Wt 199 lb 2 oz (90.323 kg)  BMI 31.18 kg/m2 NAD. Alert, oriented. Lungs clear. Heart RRR.   Assessment:  Problem List Items Addressed This Visit      Other   Morbid obesity due to excess calories (HCC)   Relevant Medications   Phentermine-Topiramate (QSYMIA) 3.75-23 MG CP24   Phentermine-Topiramate (QSYMIA) 7.5-46 MG CP24    Other Visit Diagnoses    Other fatigue    -  Primary    Snoring           Plan:  Refer for sleep study. Further followup based on results. Continue follow up with Dr. Tenny Crawoss. Meds ordered this encounter  Medications  . Phentermine-Topiramate (QSYMIA) 3.75-23 MG CP24    Sig: One po qam    Dispense:  14 capsule    Refill:  0    Order Specific Question:  Supervising Provider    Answer:  Merlyn AlbertLUKING, WILLIAM S [[id number]]  . Phentermine-Topiramate (QSYMIA) 7.5-46 MG CP24    Sig: One po qam    Dispense:  30 capsule    Refill:  2    Order Specific Question:  Supervising Provider    Answer:  Merlyn AlbertLUKING, WILLIAM S [[id number]]   Discussed potential interaction with Wellbutrin. DC med and call if any problems.  Return in about 3 months (around 09/16/2015) for recheck. If she starts Qsymia.

## 2015-06-18 ENCOUNTER — Encounter: Payer: Self-pay | Admitting: Family Medicine

## 2015-06-19 ENCOUNTER — Other Ambulatory Visit (HOSPITAL_COMMUNITY): Payer: Self-pay | Admitting: Respiratory Therapy

## 2015-06-19 DIAGNOSIS — R5383 Other fatigue: Secondary | ICD-10-CM

## 2015-06-19 DIAGNOSIS — R0683 Snoring: Secondary | ICD-10-CM

## 2015-06-19 DIAGNOSIS — G4730 Sleep apnea, unspecified: Secondary | ICD-10-CM

## 2015-06-22 ENCOUNTER — Ambulatory Visit: Payer: BC Managed Care – PPO | Admitting: Nurse Practitioner

## 2015-06-22 ENCOUNTER — Encounter: Payer: Self-pay | Admitting: Nurse Practitioner

## 2015-06-24 ENCOUNTER — Telehealth: Payer: Self-pay | Admitting: Nurse Practitioner

## 2015-06-24 NOTE — Telephone Encounter (Signed)
[name] 7.5mg -46mg   One tablet by mouth every morning is approved via patient's insurance until 06/23/16.

## 2015-06-29 ENCOUNTER — Encounter: Payer: Self-pay | Admitting: Nurse Practitioner

## 2015-06-30 ENCOUNTER — Other Ambulatory Visit: Payer: Self-pay | Admitting: Nurse Practitioner

## 2015-06-30 MED ORDER — PHENTERMINE HCL 37.5 MG PO TABS: 37.5000 mg | ORAL_TABLET | Freq: Every day | ORAL | Status: DC

## 2015-06-30 NOTE — Progress Notes (Signed)
Please place rx on my desk and I will sign on Thursday. Patient is aware.

## 2015-07-02 ENCOUNTER — Other Ambulatory Visit: Payer: Self-pay | Admitting: Nurse Practitioner

## 2015-07-02 MED ORDER — PHENTERMINE HCL 37.5 MG PO TABS: 37.5000 mg | ORAL_TABLET | Freq: Every day | ORAL | Status: DC

## 2015-07-10 ENCOUNTER — Encounter: Payer: Self-pay | Admitting: Podiatry

## 2015-07-10 ENCOUNTER — Other Ambulatory Visit: Payer: Self-pay | Admitting: Nurse Practitioner

## 2015-07-10 ENCOUNTER — Ambulatory Visit (INDEPENDENT_AMBULATORY_CARE_PROVIDER_SITE_OTHER): Payer: BC Managed Care – PPO | Admitting: Podiatry

## 2015-07-10 DIAGNOSIS — M21619 Bunion of unspecified foot: Secondary | ICD-10-CM

## 2015-07-10 DIAGNOSIS — M779 Enthesopathy, unspecified: Secondary | ICD-10-CM

## 2015-07-10 DIAGNOSIS — M722 Plantar fascial fibromatosis: Secondary | ICD-10-CM

## 2015-07-13 NOTE — Progress Notes (Signed)
Subjective:     Patient ID: Sheri Wood, female   DOB: November 05, 1970, 45 y.o.   MRN: [id number]  HPI patient presents stating that she is here to go over her consent form for correction of her right foot. 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 Patient is opted for surgical intervention and I've recommended and Austin-type osteotomy right along with endoscopic release of the medial fascial band right and shortening osteotomy second metatarsal left with screw fixation. I allowed her to read the consent form going over all possible complications as outlined and the fact there is no long-term guarantees as far as correction of deformities and the fact that recovery can take from 6 months to one year. Patient wants surgery understanding all complications and alternative treatments as outlined and signs consent form and is given all preoperative instructions. She is scheduled for outpatient surgery and is encouraged to call with any questions

## 2015-07-14 ENCOUNTER — Encounter: Payer: Self-pay | Admitting: Nurse Practitioner

## 2015-07-27 ENCOUNTER — Telehealth: Payer: Self-pay | Admitting: *Deleted

## 2015-07-27 NOTE — Telephone Encounter (Signed)
"  I'm scheduled for surgery tomorrow.  I need to speak to Sheri Wood."  You are to use the scrub brushes tonight.  "There's 2 is it one per leg?"  Yes, it' one per leg.

## 2015-07-27 NOTE — Telephone Encounter (Signed)
"  I have surgery scheduled for in the morning on both my feet actually.  You had given me the surgical packet back in March.  I forgot the instructions about the foot wash.  Do I do it tonight or do I do it in the morning?  If you would remind me of this."

## 2015-07-28 ENCOUNTER — Encounter: Payer: Self-pay | Admitting: Podiatry

## 2015-07-28 DIAGNOSIS — M2011 Hallux valgus (acquired), right foot: Secondary | ICD-10-CM | POA: Diagnosis not present

## 2015-07-28 DIAGNOSIS — M21542 Acquired clubfoot, left foot: Secondary | ICD-10-CM | POA: Diagnosis not present

## 2015-07-28 DIAGNOSIS — M722 Plantar fascial fibromatosis: Secondary | ICD-10-CM | POA: Diagnosis not present

## 2015-07-29 ENCOUNTER — Telehealth: Payer: Self-pay | Admitting: *Deleted

## 2015-07-29 MED ORDER — HYDROCODONE-ACETAMINOPHEN 10-325 MG PO TABS: ORAL_TABLET | ORAL | Status: DC

## 2015-07-29 NOTE — Telephone Encounter (Addendum)
Pt states Oxycodone makes nauseous and whoozy, would like to change to hydrocodone.  Dr. Charlsie Merlesegal states change to Hydrocodone 10/325mg  #40 one tablet every 4-6 hours. Orders to pt.  I spoke with pt and instructed her not to be up on the foot more that 15 mins/hour, remain in the boot at all times when walking, leave surgical dressing in place clean and dry, can pick [place] the new rx in the [place] office, and call our office with concerns.  Pt states understanding and has some hydrocodone from a previous surgery so may wait until tomorrow to pick up. 08/31/2015-Pt states Dr. Charlsie Merlesegal released her to begin wearing athletic shoes or the Vionex, but her surgical boot kill her foot, and should she continue the compression sock. Pt states the flatness of the surgical shoe is killing her feet, could she continue to wear the Vionex.  I told pt to switch between the Vionex and the athletic shoe if it gave her comfort and support.  I told pt she would have to go without the compression anklet and see if she had a swelling through the day.  I told her if she had uncomfortable swelling she should go back in to the anklet and try again later.

## 2015-07-31 ENCOUNTER — Other Ambulatory Visit: Payer: Self-pay

## 2015-08-04 ENCOUNTER — Telehealth: Payer: Self-pay | Admitting: Family Medicine

## 2015-08-04 MED ORDER — RANITIDINE HCL 300 MG PO TABS
ORAL_TABLET | ORAL | Status: DC
Start: 2015-08-04 — End: 2016-02-11

## 2015-08-04 MED ORDER — PREDNISONE 20 MG PO TABS: ORAL_TABLET | ORAL | Status: DC

## 2015-08-04 NOTE — Telephone Encounter (Signed)
Pt is having a bout with hives, she states she has had this for years off and on She would like something called in if she can, she had foot surgery and can  Not make it in to the office right now.    [place] apoth

## 2015-08-04 NOTE — Telephone Encounter (Signed)
Spoke with patient and patient stated that hives are diffused and appear to be under the surface of skin. Has had several bouts of hives in the past with no known causes. Denies fever. Please advise?

## 2015-08-04 NOTE — Telephone Encounter (Signed)
Spoke with patient and informed her Dr.Steve Luking- Adult prednisone taper plus zantac 300 mg 1 tablet daily for 21 days was sent into pharmacy. Patient verbalized understanding.

## 2015-08-04 NOTE — Telephone Encounter (Signed)
Adult pred taper plus zantac 300 one daily for21 days

## 2015-08-07 ENCOUNTER — Ambulatory Visit (INDEPENDENT_AMBULATORY_CARE_PROVIDER_SITE_OTHER): Payer: BC Managed Care – PPO | Admitting: Podiatry

## 2015-08-07 ENCOUNTER — Ambulatory Visit (INDEPENDENT_AMBULATORY_CARE_PROVIDER_SITE_OTHER): Payer: BC Managed Care – PPO

## 2015-08-07 ENCOUNTER — Encounter: Payer: Self-pay | Admitting: Podiatry

## 2015-08-07 VITALS — BP 113/70 | HR 90 | Resp 12

## 2015-08-07 DIAGNOSIS — M722 Plantar fascial fibromatosis: Secondary | ICD-10-CM | POA: Diagnosis not present

## 2015-08-07 DIAGNOSIS — M21619 Bunion of unspecified foot: Secondary | ICD-10-CM | POA: Diagnosis not present

## 2015-08-07 DIAGNOSIS — Z9889 Other specified postprocedural states: Secondary | ICD-10-CM

## 2015-08-07 NOTE — Progress Notes (Signed)
Subjective:     Patient ID: Sheri GalasJennifer L Wood, female   DOB: 12-20-1970, 45 y.o.   MRN: [id number]  HPI patient states she's feeling really good on both feet and she's able to walk without discomfort   Review of Systems     Objective:   Physical Exam Neurovascular status intact negative Homan sign was noted with well-healing surgical site right first metatarsal right heel and left second metatarsal with wound edges well coapted and excellent alignment    Assessment:     Doing well post foot surgery bilateral    Plan:     Sterile dressings reapplied and begin gradual surgical shoe usage on the right and continued elevation and reappoint 2 weeks suture removal and we will re-x-ray  X-ray right indicate that the pins are in good position osteotomy is healing well with no indication of movement

## 2015-08-14 ENCOUNTER — Ambulatory Visit (INDEPENDENT_AMBULATORY_CARE_PROVIDER_SITE_OTHER): Payer: BC Managed Care – PPO | Admitting: Podiatry

## 2015-08-14 ENCOUNTER — Ambulatory Visit (INDEPENDENT_AMBULATORY_CARE_PROVIDER_SITE_OTHER): Payer: BC Managed Care – PPO

## 2015-08-14 DIAGNOSIS — M21619 Bunion of unspecified foot: Secondary | ICD-10-CM

## 2015-08-14 DIAGNOSIS — Z9889 Other specified postprocedural states: Secondary | ICD-10-CM

## 2015-08-14 DIAGNOSIS — M722 Plantar fascial fibromatosis: Secondary | ICD-10-CM | POA: Diagnosis not present

## 2015-08-16 NOTE — Progress Notes (Signed)
Subjective:     Patient ID: Sheri Wood, female   DOB: 1971-01-09, 45 y.o.   MRN: [id number]  HPI patient states I'm doing well with mild discomfort if I been on my feet too much but overall I'm doing good with surgery   Review of Systems     Objective:   Physical Exam Neurovascular status intact muscle strength adequate with well-healing surgical site right first metatarsal heel and second metatarsal left with wound edges well coapted and mild to moderate discomfort with palpation with negative Homans sign noted    Assessment:     Doing well post surgical procedures right and left foot    Plan:     H&P and x-rays reviewed with patient and at this point I have recommended gradual increase in activity levels along with continued compression immobilization and boot usage on the right. Patient will be seen back in 4 weeks or earlier if needed  X-ray report indicated that the osteotomies are healing well with pins in place good alignment and no indication of movement

## 2015-08-29 ENCOUNTER — Other Ambulatory Visit: Payer: Self-pay | Admitting: Nurse Practitioner

## 2015-08-29 ENCOUNTER — Other Ambulatory Visit: Payer: Self-pay | Admitting: Family Medicine

## 2015-08-31 NOTE — Telephone Encounter (Signed)
Ok plus one ref 

## 2015-08-31 NOTE — Telephone Encounter (Signed)
Ok six mo all 

## 2015-09-04 ENCOUNTER — Ambulatory Visit (INDEPENDENT_AMBULATORY_CARE_PROVIDER_SITE_OTHER): Payer: BC Managed Care – PPO | Admitting: Family Medicine

## 2015-09-04 ENCOUNTER — Encounter: Payer: Self-pay | Admitting: Family Medicine

## 2015-09-04 VITALS — BP 118/74 | Temp 98.4°F | Ht 67.0 in | Wt 198.6 lb

## 2015-09-04 DIAGNOSIS — H6123 Impacted cerumen, bilateral: Secondary | ICD-10-CM

## 2015-09-04 MED ORDER — AMOXICILLIN 500 MG PO CAPS: 500.0000 mg | ORAL_CAPSULE | Freq: Three times a day (TID) | ORAL | 0 refills | Status: DC

## 2015-09-04 NOTE — Progress Notes (Signed)
   Subjective:    Patient ID: [name], female    DOB: February 05, 1971, 45 y.o.   MRN: [id number]  Otalgia   There is pain in both ears. This is a new problem. The current episode started in the past 7 days. Associated symptoms include coughing and headaches. Treatments tried: mucinex d.     Right ear stopped up laasted a few day  No sig dizzines s ppost headaches  No feve stuffiness nd dranage   And cong and cough    freq wax in ears  Dark Stephan Draughn   Ringing in ears    Muffled    Review of Systems  HENT: Positive for ear pain.   Respiratory: Positive for cough.   Neurological: Positive for headaches.       Objective:   Physical Exam  Alert vitals stable mild malaise both external canals packed with wax pharynx normal neck supple lungs clear heart regular rate and rhythm.      Assessment & Plan:  Impression bilateral cerumen impaction along with potential otitis media plan antibiotics prescribed symptom care discussed 1 signs discussed

## 2015-09-04 NOTE — Patient Instructions (Signed)
Debrox drops

## 2015-09-07 ENCOUNTER — Encounter: Payer: Self-pay | Admitting: Family Medicine

## 2015-09-10 ENCOUNTER — Ambulatory Visit (INDEPENDENT_AMBULATORY_CARE_PROVIDER_SITE_OTHER): Payer: BC Managed Care – PPO

## 2015-09-10 ENCOUNTER — Ambulatory Visit (INDEPENDENT_AMBULATORY_CARE_PROVIDER_SITE_OTHER): Payer: BC Managed Care – PPO | Admitting: Podiatry

## 2015-09-10 ENCOUNTER — Encounter: Payer: Self-pay | Admitting: Podiatry

## 2015-09-10 VITALS — BP 112/70 | HR 81 | Resp 16

## 2015-09-10 DIAGNOSIS — M21619 Bunion of unspecified foot: Secondary | ICD-10-CM

## 2015-09-10 DIAGNOSIS — M722 Plantar fascial fibromatosis: Secondary | ICD-10-CM | POA: Diagnosis not present

## 2015-09-10 DIAGNOSIS — Z9889 Other specified postprocedural states: Secondary | ICD-10-CM

## 2015-09-10 NOTE — Progress Notes (Signed)
Subjective:     Patient ID: Sheri Wood, female   DOB: 1971/02/05, 45 y.o.   MRN: [id number]  HPI patient states that she's doing real well with still has a little stiffness around the big toe joint on the right   Review of Systems     Objective:   Physical Exam Neurovascular status intact muscle strength adequate with patient found to have good range of motion first MPJ right with pain in the right heel and no pain in the left forefoot    Assessment:     Doing well post extensive surgery bilateral    Plan:     Advised on physical therapy anti-inflammatories and the continuation range of motion exercises. Reviewed x-rays and reappoint in for final visit in 8 weeks or earlier if needed  X-ray report indicates the osteotomy is healing well with slight healing of the dorsal arm of the [facility] with patient found to have good screw position left

## 2015-09-14 ENCOUNTER — Ambulatory Visit (INDEPENDENT_AMBULATORY_CARE_PROVIDER_SITE_OTHER): Payer: BC Managed Care – PPO | Admitting: Nurse Practitioner

## 2015-09-14 ENCOUNTER — Encounter: Payer: Self-pay | Admitting: Nurse Practitioner

## 2015-09-14 VITALS — BP 110/74 | Temp 98.2°F | Ht 67.0 in | Wt 199.5 lb

## 2015-09-14 DIAGNOSIS — M546 Pain in thoracic spine: Secondary | ICD-10-CM

## 2015-09-14 DIAGNOSIS — K5909 Other constipation: Secondary | ICD-10-CM

## 2015-09-14 DIAGNOSIS — K219 Gastro-esophageal reflux disease without esophagitis: Secondary | ICD-10-CM | POA: Diagnosis not present

## 2015-09-14 DIAGNOSIS — K5900 Constipation, unspecified: Secondary | ICD-10-CM | POA: Diagnosis not present

## 2015-09-14 DIAGNOSIS — R350 Frequency of micturition: Secondary | ICD-10-CM

## 2015-09-14 DIAGNOSIS — R1031 Right lower quadrant pain: Secondary | ICD-10-CM | POA: Diagnosis not present

## 2015-09-14 LAB — POCT URINALYSIS DIPSTICK
Blood, UA: NEGATIVE
Nitrite, UA: NEGATIVE
Spec Grav, UA: <=1.005
pH, UA: 6

## 2015-09-14 MED ORDER — CHLORZOXAZONE 500 MG PO TABS: 500.0000 mg | ORAL_TABLET | Freq: Three times a day (TID) | ORAL | 0 refills | Status: DC | PRN

## 2015-09-14 MED ORDER — PANTOPRAZOLE SODIUM 40 MG PO TBEC: 40.0000 mg | DELAYED_RELEASE_TABLET | Freq: Every day | ORAL | 0 refills | Status: DC

## 2015-09-14 NOTE — Patient Instructions (Signed)
Food Choices for Gastroesophageal Reflux Disease, Adult When you have gastroesophageal reflux disease (GERD), the foods you eat and your eating habits are very important. Choosing the right foods can help ease the discomfort of GERD. WHAT GENERAL GUIDELINES DO I NEED TO FOLLOW?  Choose fruits, vegetables, whole grains, low-fat dairy products, and low-fat meat, fish, and poultry.  Limit fats such as oils, salad dressings, butter, nuts, and avocado.  Keep a food diary to identify foods that cause symptoms.  Avoid foods that cause reflux. These may be different for different people.  Eat frequent small meals instead of three large meals each day.  Eat your meals slowly, in a relaxed setting.  Limit fried foods.  Cook foods using methods other than frying.  Avoid drinking alcohol.  Avoid drinking large amounts of liquids with your meals.  Avoid bending over or lying down until 2-3 hours after eating. WHAT FOODS ARE NOT RECOMMENDED? The following are some foods and drinks that may worsen your symptoms: Vegetables Tomatoes. Tomato juice. Tomato and spaghetti sauce. Chili peppers. Onion and garlic. Horseradish. Fruits Oranges, grapefruit, and lemon (fruit and juice). Meats High-fat meats, fish, and poultry. This includes hot dogs, ribs, ham, sausage, salami, and bacon. Dairy Whole milk and chocolate milk. Sour cream. Cream. Butter. Ice cream. Cream cheese.  Beverages Coffee and tea, with or without caffeine. Carbonated beverages or energy drinks. Condiments Hot sauce. Barbecue sauce.  Sweets/Desserts Chocolate and cocoa. Donuts. Peppermint and spearmint. Fats and Oils High-fat foods, including French fries and potato chips. Other Vinegar. Strong spices, such as black pepper, white pepper, red pepper, cayenne, curry powder, cloves, ginger, and chili powder. The items listed above may not be a complete list of foods and beverages to avoid. Contact your dietitian for more  information.   This information is not intended to replace advice given to you by your health care provider. Make sure you discuss any questions you have with your health care provider.   Document Released: 01/24/2005 Document Revised: 02/14/2014 Document Reviewed: 11/28/2012 Elsevier Interactive Patient Education 2016 Elsevier Inc.  

## 2015-09-15 ENCOUNTER — Encounter: Payer: Self-pay | Admitting: Nurse Practitioner

## 2015-09-15 NOTE — Progress Notes (Signed)
Subjective:  Presents for c/o right lower abdominal pain x one month. Right sided lower thoracic area pain near the spine x 2 weeks. Has had endometrial ablation. Sees Dr. Algie CofferFogelman for GYN. Saw her recently; discussed symptoms but not evaluated. Regular menses with occasional break through spotting. No fever. Same sexual partner. No dysuria. Has had frequency and urgency at times. Mild incontinence. No N/V. Chronic frequent constipation. Has decreased caffeine intake. Frequent ibuprofen use. Some acid reflux.   Objective:   BP 110/74   Temp 98.2 F (36.8 C) (Oral)   Ht 5\' 7"  (1.702 m)   Wt 199 lb 8 oz (90.5 kg)   BMI 31.25 kg/m  NAD. Alert, oriented. Lungs clear. Heart RRR. No CVA tenderness. Mild tenderness with palpation near right lower thoracic spine. Abdomen soft, non distended with active BS x 4. Moderate pain to palpation right lower abdomen near right pelvic area. Mild tenderness epigastric area.  Results for orders placed or performed in visit on 09/14/15  POCT urinalysis dipstick  Result Value Ref Range   Color, UA Yellow    Clarity, UA Clear    Glucose, UA     Bilirubin, UA     Ketones, UA     Spec Grav, UA <=1.005    Blood, UA Negative    pH, UA 6.0    Protein, UA     Urobilinogen, UA     Nitrite, UA Negative    Leukocytes, UA Trace (A) Negative   Urine micro neg; urine very dilute.   Assessment: Urinary frequency - Plan: POCT urinalysis dipstick, Urine Culture  RLQ abdominal pain - Plan: Urine Culture, Basic metabolic panel, CBC with Differential/Platelet, CT Abdomen Pelvis W Contrast  Gastroesophageal reflux disease without esophagitis  Chronic constipation - Plan: Basic metabolic panel, CBC with Differential/Platelet, CT Abdomen Pelvis W Contrast  Right-sided thoracic back pain  Plan:  Meds ordered this encounter  Medications  . pantoprazole (PROTONIX) 40 MG tablet    Sig: Take 1 tablet (40 mg total) by mouth daily.    Dispense:  30 tablet    Refill:  0   Order Specific Question:   Supervising Provider    Answer:   Merlyn AlbertLUKING, WILLIAM S [[id number]]  . chlorzoxazone (PARAFON) 500 MG tablet    Sig: Take 1 tablet (500 mg total) by mouth 3 (three) times daily as needed for muscle spasms.    Dispense:  30 tablet    Refill:  0    Order Specific Question:   Supervising Provider    Answer:   Merlyn AlbertLUKING, WILLIAM S [[id number]]   Scan and labs pending. Warning signs reviewed. Further follow up based on results, call or go to ED sooner if worse.

## 2015-09-16 ENCOUNTER — Other Ambulatory Visit: Payer: Self-pay | Admitting: Nurse Practitioner

## 2015-09-16 ENCOUNTER — Encounter: Payer: Self-pay | Admitting: Family Medicine

## 2015-09-16 LAB — URINE CULTURE

## 2015-09-16 LAB — CBC WITH DIFFERENTIAL/PLATELET
Basophils Absolute: 0.1 10*3/uL (ref 0.0–0.2)
Basos: 1 %
EOS (ABSOLUTE): 0.4 10*3/uL (ref 0.0–0.4)
Eos: 4 %
Hematocrit: 43 % (ref 34.0–46.6)
Hemoglobin: 14.5 g/dL (ref 11.1–15.9)
Immature Grans (Abs): 0 10*3/uL (ref 0.0–0.1)
Immature Granulocytes: 0 %
Lymphocytes Absolute: 3 10*3/uL (ref 0.7–3.1)
Lymphs: 28 %
MCH: 30.2 pg (ref 26.6–33.0)
MCHC: 33.7 g/dL (ref 31.5–35.7)
MCV: 90 fL (ref 79–97)
Monocytes Absolute: 0.8 10*3/uL (ref 0.1–0.9)
Monocytes: 7 %
Neutrophils Absolute: 6.6 10*3/uL (ref 1.4–7.0)
Neutrophils: 60 %
Platelets: 276 10*3/uL (ref 150–379)
RBC: 4.8 x10E6/uL (ref 3.77–5.28)
RDW: 13.4 % (ref 12.3–15.4)
WBC: 10.9 10*3/uL — ABNORMAL HIGH (ref 3.4–10.8)

## 2015-09-16 LAB — BASIC METABOLIC PANEL
BUN/Creatinine Ratio: 14 (ref 9–23)
BUN: 10 mg/dL (ref 6–24)
CO2: 27 mmol/L (ref 18–29)
Calcium: 9.4 mg/dL (ref 8.7–10.2)
Chloride: 101 mmol/L (ref 96–106)
Creatinine, Ser: 0.7 mg/dL (ref 0.57–1.00)
GFR calc Af Amer: 120 mL/min/{1.73_m2} (ref >59)
GFR calc non Af Amer: 104 mL/min/{1.73_m2} (ref >59)
Glucose: 88 mg/dL (ref 65–99)
Potassium: 4.3 mmol/L (ref 3.5–5.2)
Sodium: 141 mmol/L (ref 134–144)

## 2015-09-16 LAB — PLEASE NOTE

## 2015-09-16 MED ORDER — CIPROFLOXACIN HCL 500 MG PO TABS: 500.0000 mg | ORAL_TABLET | Freq: Two times a day (BID) | ORAL | 0 refills | Status: DC

## 2015-09-18 ENCOUNTER — Ambulatory Visit (HOSPITAL_COMMUNITY)
Admission: RE | Admit: 2015-09-18 | Discharge: 2015-09-18 | Disposition: A | Discharge status: Home / Self Care | Payer: BC Managed Care – PPO | Source: Ambulatory Visit | Attending: Nurse Practitioner | Admitting: Nurse Practitioner

## 2015-09-18 DIAGNOSIS — R1031 Right lower quadrant pain: Secondary | ICD-10-CM | POA: Diagnosis not present

## 2015-09-18 DIAGNOSIS — K5900 Constipation, unspecified: Secondary | ICD-10-CM | POA: Insufficient documentation

## 2015-09-18 MED ORDER — IOPAMIDOL (ISOVUE-300) INJECTION 61%
100.0000 mL | Freq: Once | INTRAVENOUS | Status: AC | PRN
  Administered 2015-09-18: 100 mL via INTRAVENOUS

## 2015-09-21 ENCOUNTER — Telehealth: Payer: Self-pay | Admitting: Family Medicine

## 2015-09-21 MED ORDER — FLUCONAZOLE 150 MG PO TABS: ORAL_TABLET | ORAL | 0 refills | Status: DC

## 2015-09-21 NOTE — Telephone Encounter (Signed)
Patient is on antibiotic for a bladder infection and it has given her a yeast infection.  She would like diflucan called in.   [facility]

## 2015-09-21 NOTE — Telephone Encounter (Signed)
Per protocol : Diflucan 150 mg #2 one tablet po 3 days apart sent electronically to pharmacy. Patient notified.

## 2015-09-22 ENCOUNTER — Telehealth: Payer: Self-pay | Admitting: Nurse Practitioner

## 2015-09-22 MED ORDER — AMOXICILLIN-POT CLAVULANATE 875-125 MG PO TABS: 1.0000 | ORAL_TABLET | Freq: Two times a day (BID) | ORAL | 0 refills | Status: AC

## 2015-09-22 MED ORDER — NITROFURANTOIN MONOHYD MACRO 100 MG PO CAPS: 100.0000 mg | ORAL_CAPSULE | Freq: Two times a day (BID) | ORAL | 0 refills | Status: DC

## 2015-09-22 NOTE — Telephone Encounter (Signed)
Pt called stating that she does not do well on cipro or levaquin and is needing something else called in. Pt was called in something over the weekend for her bladder infection.     [facility]

## 2015-09-22 NOTE — Telephone Encounter (Signed)
Too bad cannot take cipro which would have covered both organisms, aug 875 bid seven d, macrobid 100 bid 7 d

## 2015-09-22 NOTE — Telephone Encounter (Signed)
Medications sent to pharmacy. Left message on voicemail notifying patient.

## 2015-09-28 ENCOUNTER — Telehealth: Payer: Self-pay | Admitting: Nurse Practitioner

## 2015-09-28 NOTE — Telephone Encounter (Signed)
Nurse talk with pt. I think the pt needs to be very careful to declare medicine "allergy" too prematurely or we may end up having no meds we can put her on. What exactly were her symtoms??

## 2015-09-28 NOTE — Telephone Encounter (Signed)
Sometimes body eradicate urinary infxns on itts own . Try amox on itx own, pt has had pcn products in past and was able tpo take in past.  After donew wqith full course of amox, give another urine sample for culture and we will see what we are dealing with

## 2015-09-28 NOTE — Telephone Encounter (Signed)
Spoke with patient and informed her per Dr.Steve Luking- Sometimes body eradicate urinary infections on its own . Try Amoxicillin on its own,has had pencillin products in past and was able to take in past.  After done with full course of amoxicillin, give another urine sample for culture and we will see what we are dealing with. Patient's verbalized understanding and stated that she has had a nausea and vomiting to with Augmentin in the past and due to Amoxicillin being in same class patient did not want to take. Patient wants us to add Augmentin to allergy list.

## 2015-09-28 NOTE — Telephone Encounter (Signed)
Spoke with patient and patient stated that she started the Amoxicillin and nitrofurantoin on Saturday and after taking medications with food she experienced nausea and vomiting for about 3 hours after taking. Patient states she only took one dose of each. Please advise?

## 2015-09-28 NOTE — Telephone Encounter (Signed)
Patient had amoxicillin and nitrofurantoin called in on 09/22/15.  She said she started these medications on Saturday and they made her really sick so she stopped taking them.  She wants to know what else she can take? Please advise.  [facility]

## 2015-09-28 NOTE — Telephone Encounter (Signed)
Spoke with patient and informed patient per Dr.Steve Luking- that plain Amoxcillin should be fine even with history  GI upset from Augmentin. Dr.Steve stated that almost never do patient have GI upset from Amoxcillin. Patient verbalized understanding and stated that she will try it.

## 2015-10-02 ENCOUNTER — Telehealth: Payer: Self-pay | Admitting: Family Medicine

## 2015-10-02 ENCOUNTER — Other Ambulatory Visit: Payer: Self-pay | Admitting: Nurse Practitioner

## 2015-10-02 MED ORDER — TERCONAZOLE 0.4 % VA CREA: 1.0000 | TOPICAL_CREAM | Freq: Every day | VAGINAL | 0 refills | Status: DC

## 2015-10-02 NOTE — Telephone Encounter (Signed)
Pt called states she is being treated for infection with antibiotics. Used Diflucan with no results. Requesting Terazol 7 Vaginal Cream be called in.

## 2015-10-02 NOTE — Telephone Encounter (Signed)
done

## 2015-11-05 NOTE — Progress Notes (Signed)
DOS 06.20.2017 El[place]erto [place] Bunionectomy (cutting and moving bone) with Pin Fixation Right; Endoscopic Plantar Fascial Release Right; Shortening Osteotomy 2nd Left

## 2015-11-12 ENCOUNTER — Ambulatory Visit: Payer: Self-pay

## 2015-11-12 ENCOUNTER — Ambulatory Visit (INDEPENDENT_AMBULATORY_CARE_PROVIDER_SITE_OTHER): Payer: BC Managed Care – PPO | Admitting: Podiatry

## 2015-11-12 ENCOUNTER — Encounter: Payer: Self-pay | Admitting: Podiatry

## 2015-11-12 ENCOUNTER — Ambulatory Visit (INDEPENDENT_AMBULATORY_CARE_PROVIDER_SITE_OTHER): Payer: BC Managed Care – PPO

## 2015-11-12 VITALS — BP 111/57 | HR 77 | Resp 16

## 2015-11-12 DIAGNOSIS — Z9889 Other specified postprocedural states: Secondary | ICD-10-CM

## 2015-11-12 DIAGNOSIS — M21619 Bunion of unspecified foot: Secondary | ICD-10-CM

## 2015-11-15 NOTE — Progress Notes (Signed)
Subjective:     Patient ID: Sheri GalasJennifer L Antuna, female   DOB: 01-27-1971, 45 y.o.   MRN: [id number]  HPI patient presents stating that she's doing pretty well with her foot but still can have achiness if she's been on a long time   Review of Systems     Objective:   Physical Exam Neurovascular status intact muscle strength adequate with patient's left foot doing well with pin intact structural alignment good and joint congruence    Assessment:     Doing well post forefoot reconstruction    Plan:     Reviewed conditions and explained the importance of continued stretching for the heel and range of motion exercises. Patient's discharge will be seen back as needed  X-ray report indicates that the osteotomy is healing well with no signs of movement and pin in place

## 2015-12-15 ENCOUNTER — Encounter: Payer: Self-pay | Admitting: Family Medicine

## 2015-12-15 ENCOUNTER — Ambulatory Visit (INDEPENDENT_AMBULATORY_CARE_PROVIDER_SITE_OTHER): Payer: BC Managed Care – PPO | Admitting: Family Medicine

## 2015-12-15 VITALS — BP 120/76 | Temp 98.3°F | Ht 67.0 in | Wt 198.2 lb

## 2015-12-15 DIAGNOSIS — R300 Dysuria: Secondary | ICD-10-CM

## 2015-12-15 DIAGNOSIS — N3000 Acute cystitis without hematuria: Secondary | ICD-10-CM

## 2015-12-15 LAB — POCT URINALYSIS DIPSTICK
Spec Grav, UA: 1.015
pH, UA: 6

## 2015-12-15 MED ORDER — CIPROFLOXACIN HCL 500 MG PO TABS: 500.0000 mg | ORAL_TABLET | Freq: Two times a day (BID) | ORAL | 0 refills | Status: DC

## 2015-12-15 NOTE — Progress Notes (Signed)
   Subjective:    Patient ID: Sheri GalasJennifer L Wood, female    DOB: 12-Nov-1970, 45 y.o.   MRN: [id number]  HPI Patient arrives with c/o dysuria. Patient with lower abdominal discomfort slight dysuria urinary from C denies flank pain fever chills sweats nausea vomiting diarrhea had a urinary tract infection approximate 3 months ago Review of Systems See above    Objective:   Physical Exam Lungs clear heart regular flank nontender   Vitals noted    Assessment & Plan:  UTI-urinalysis shows TNTC antibiotics recommended Cipro twice a day for the next 5 days culture sent. May need follow-up testing await the results

## 2015-12-17 LAB — URINE CULTURE

## 2015-12-24 ENCOUNTER — Other Ambulatory Visit: Payer: Self-pay | Admitting: Nurse Practitioner

## 2015-12-24 ENCOUNTER — Ambulatory Visit (INDEPENDENT_AMBULATORY_CARE_PROVIDER_SITE_OTHER): Payer: BC Managed Care – PPO

## 2015-12-24 ENCOUNTER — Ambulatory Visit (INDEPENDENT_AMBULATORY_CARE_PROVIDER_SITE_OTHER): Payer: BC Managed Care – PPO | Admitting: Podiatry

## 2015-12-24 ENCOUNTER — Encounter: Payer: Self-pay | Admitting: Nurse Practitioner

## 2015-12-24 DIAGNOSIS — M2042 Other hammer toe(s) (acquired), left foot: Secondary | ICD-10-CM

## 2015-12-24 DIAGNOSIS — M21619 Bunion of unspecified foot: Secondary | ICD-10-CM

## 2015-12-24 DIAGNOSIS — Z9889 Other specified postprocedural states: Secondary | ICD-10-CM

## 2015-12-24 MED ORDER — NITROFURANTOIN MONOHYD MACRO 100 MG PO CAPS
100.0000 mg | ORAL_CAPSULE | Freq: Two times a day (BID) | ORAL | 0 refills | Status: DC
Start: 2015-12-24 — End: 2016-02-11

## 2015-12-24 NOTE — Patient Instructions (Addendum)
WEARING INSTRUCTIONS FOR ORTHOTICS  Don't expect to be comfortable wearing your orthotic devices for the first time.  Like eyeglasses, you may be aware of them as time passes, they will not be uncomfortable and you will enjoy wearing them.  FOLLOW THESE INSTRUCTIONS EXACTLY!  1. Wear your orthotic devices for:       Not more than 1 hour the first day.       Not more than 2 hours the second day.       Not more than 3 hours the third day and so on.        Or wear them for as long as they feel comfortable.       If you experience discomfort in your feet or legs take them out.  When feet & legs feel       better, put them back in.  You do need to be consistent and wear them a little        everyday. 2.   If at any time the orthotic devices become acutely uncomfortable before the       time for that particular day, STOP WEARING THEM. 3.   On the next day, do not increase the wearing time. 4.   Subsequently, increase the wearing time by 15-30 minutes only if comfortable to do       so. 5.   You will be seen by your doctor about 2-4 weeks after you receive your orthotic       devices, at which time you will probably be wearing your devices comfortably        for about 8 hours or more a day. 6.   Some patients occasionally report mild aches or discomfort in other parts of the of       body such as the knees, hips or back after 3 or 4 consecutive hours of wear.  If this       is the case with you, do not extend your wearing time.  Instead, cut it back an hour or       two.  In all likelihood, these symptoms will disappear in a short period of time as your       body posture realigns itself and functions more efficiently. 7.   It is possible that your orthotic device may require some small changes or adjustment       to improve their function or make them more comfortable.   This is usually not done       before one to three months have elapsed.  These adjustments are made in        accordance  with the changed position your feet are assuming as a result of       improved biomechanical function. 8.   In women's shoes, it's not unusual for your heel to slip out of the shoe, particularly if       they are step-in-shoes.  If this is the case, try other shoes or other styles.  Try to       purchase shoes which have deeper heal seats or higher heel counters. 9.   Squeaking of orthotics devices in the shoes is due to the movement of the devices       when they are functioning normally.  To eliminate squeaking, simply dust some       baby powder into your shoes before inserting the devices.  If this does not work,          apply soap or wax to the edges of the orthotic devices or put a tissue into the shoes. 10. It is important that you follow these directions explicitly.  Failure to do so will simply       prolong the adjustment period or create problems which are easily avoided.  It makes       no difference if you are wearing your orthotic devices for only a few hours after        several months, so long as you are wearing them comfortably for those hours. 11. If you have any questions or complaints, contact our office.  We have no way of       knowing about your problems unless you tell us.  If we do not hear from you, we will       assume that you are proceeding well.   Pre-Operative Instructions  Congratulations, you have decided to take an important step to improving your quality of life.  You can be assured that the doctors of [facility] will be with you every step of the way.  2. Plan to be at the surgery center/hospital at least 1 (one) hour prior to your scheduled time unless otherwise directed by the surgical center/hospital staff.  You must have a responsible adult accompany you, remain during the surgery and drive you home.  Make sure you have directions to the surgical center/hospital and know how to get there on time. 3. For hospital based surgery you will need to  obtain a history and physical form from your family physician within 1 month prior to the date of surgery- we will give you a form for you primary physician.  4. We make every effort to accommodate the date you request for surgery.  There are however, times where surgery dates or times have to be moved.  We will contact you as soon as possible if a change in schedule is required.   5. No Aspirin/Ibuprofen for one week before surgery.  If you are on aspirin, any non-steroidal anti-inflammatory medications (Mobic, Aleve, Ibuprofen) you should stop taking it 7 days prior to your surgery.  You make take Tylenol  For pain prior to surgery.  6. Medications- If you are taking daily heart and blood pressure medications, seizure, reflux, allergy, asthma, anxiety, pain or diabetes medications, make sure the surgery center/hospital is aware before the day of surgery so they may notify you which medications to take or avoid the day of surgery. 7. No food or drink after midnight the night before surgery unless directed otherwise by surgical center/hospital staff. 8. No alcoholic beverages 24 hours prior to surgery.  No smoking 24 hours prior to or 24 hours after surgery. 9. Wear loose pants or shorts- loose enough to fit over bandages, boots, and casts. 10. No slip on shoes, sneakers are best. 11. Bring your boot with you to the surgery center/hospital.  Also bring crutches or a walker if your physician has prescribed it for you.  If you do not have this equipment, it will be provided for you after surgery. 12. If you have not been contracted by the surgery center/hospital by the day before your surgery, call to confirm the date and time of your surgery. 13. Leave-time from work may vary depending on the type of surgery you have.  Appropriate arrangements should be made prior to surgery with your employer. 14. Prescriptions will be provided immediately following surgery by your doctor.  Have these filled   as soon as  possible after surgery and take the medication as directed. 15. Remove nail polish on the operative foot. 16. Wash the night before surgery.  The night before surgery wash the foot and leg well with the antibacterial soap provided and water paying special attention to beneath the toenails and in between the toes.  Rinse thoroughly with water and dry well with a towel.  Perform this wash unless told not to do so by your physician.  Enclosed: 1 Ice pack (please put in freezer the night before surgery)   1 Hibiclens skin cleaner   Pre-op Instructions  If you have any questions regarding the instructions, do not hesitate to call our office.  [place]: [address] [place], [place] [address] [phone]  [place]: [address] [place], [place] [address] [phone]  [place]: [address]  [place], [place] [address] [phone]   Dr. Norman Regal DPM, Dr. Matthew Wagoner DPM, Dr. M. Todd Hyatt DPM, Dr. Titorya Stover DPM 

## 2015-12-24 NOTE — Progress Notes (Signed)
Subjective:     Patient ID: Sheri Wood, female   DOB: July 29, 1970, 45 y.o.   MRN: [id number]  HPI patient states she's been doing well but the bunion on her left foot has started to bother her a lot more over the last couple months and she's able to wear shoes now with her right foot and the third toe is really bothering her and she wants to get them fixed before the end of the year. 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 I do think Austin-type osteotomy left along with distal arthroplasty left would be of great benefit to her with her pain. Patient wants surgery and I allowed her to read consent form going over alternative treatments complications as everything as outlined. She understands no guarantee as far success understands risk and signs consent form after extensive review. Patient is scheduled for outpatient surgery left understanding total recovery. Can take approximate 6 months and I did dispense air fracture walker for the left as the other one has lost ability to hold her foot up  X-ray report indicates all osteotomies if healed well pins screw in place with elevation of the intermetatarsal angle left and rotation third toe left distal

## 2015-12-28 ENCOUNTER — Telehealth: Payer: Self-pay | Admitting: Family Medicine

## 2015-12-28 MED ORDER — TERCONAZOLE 0.4 % VA CREA: 1.0000 | TOPICAL_CREAM | Freq: Every day | VAGINAL | 0 refills | Status: DC

## 2015-12-28 MED ORDER — FLUCONAZOLE 150 MG PO TABS: 150.0000 mg | ORAL_TABLET | Freq: Every day | ORAL | 0 refills | Status: DC

## 2015-12-28 NOTE — Telephone Encounter (Signed)
Diflucan 150 one p o three d apart, terazol 7 apply qd for seven d

## 2015-12-28 NOTE — Telephone Encounter (Signed)
Patient believes she has a yeast infection due to taking antibiotics last week.  She wants to know if we can call something in for her.   [facility]

## 2015-12-28 NOTE — Telephone Encounter (Signed)
Spoke with pt regarding request for diflucan. Pt has itching and discharge. No fever, bleeding or pain. Also requests RX of Tetrazole. She states it takes both to resolve her infection after taking abx.

## 2015-12-28 NOTE — Telephone Encounter (Signed)
Prescriptions sent electronically to pharmacy. Patient notified. °

## 2015-12-28 NOTE — Addendum Note (Signed)
Addended by: Margaretha SheffieldBROWN, Riyana Biel S on: 12/28/2015 05:36 PM   Modules accepted: Orders

## 2015-12-28 NOTE — Telephone Encounter (Signed)
Diflucan sent to pharmacy. See other telephone note

## 2015-12-28 NOTE — Telephone Encounter (Signed)
Left message on voicemail to return call (need symptoms)

## 2016-01-06 ENCOUNTER — Telehealth: Payer: Self-pay | Admitting: *Deleted

## 2016-01-06 NOTE — Telephone Encounter (Addendum)
Pt states she is scheduled to have surgery 01/14/2016 with Dr. Charlsie Merlesegal and states the right bunion looks like it is coming back. Pt asked if Dr. Charlsie Merlesegal would like to see her again for this and is there anything he needs to do? Pt asked if Dr. Charlsie Merlesegal had responded I told her not yet but I would call when he did. 01/08/2016-Informed pt of Dr. Beverlee Nimsegal's statement, pt states good.

## 2016-01-07 NOTE — Telephone Encounter (Signed)
Shouldn't be coming back. I will look at it when she comes in for surgery

## 2016-01-12 ENCOUNTER — Encounter: Payer: Self-pay | Admitting: Podiatry

## 2016-01-13 DIAGNOSIS — M2042 Other hammer toe(s) (acquired), left foot: Secondary | ICD-10-CM | POA: Diagnosis not present

## 2016-01-13 DIAGNOSIS — Z4889 Encounter for other specified surgical aftercare: Secondary | ICD-10-CM | POA: Diagnosis not present

## 2016-01-13 DIAGNOSIS — M722 Plantar fascial fibromatosis: Secondary | ICD-10-CM | POA: Diagnosis not present

## 2016-01-13 DIAGNOSIS — M2012 Hallux valgus (acquired), left foot: Secondary | ICD-10-CM | POA: Diagnosis not present

## 2016-01-18 ENCOUNTER — Ambulatory Visit (INDEPENDENT_AMBULATORY_CARE_PROVIDER_SITE_OTHER): Payer: BC Managed Care – PPO

## 2016-01-18 ENCOUNTER — Encounter: Payer: Self-pay | Admitting: Podiatry

## 2016-01-18 ENCOUNTER — Ambulatory Visit (INDEPENDENT_AMBULATORY_CARE_PROVIDER_SITE_OTHER): Payer: BC Managed Care – PPO | Admitting: Podiatry

## 2016-01-18 DIAGNOSIS — M2042 Other hammer toe(s) (acquired), left foot: Secondary | ICD-10-CM

## 2016-01-18 DIAGNOSIS — M2012 Hallux valgus (acquired), left foot: Secondary | ICD-10-CM | POA: Diagnosis not present

## 2016-01-19 NOTE — Progress Notes (Signed)
Subjective:     Patient ID: Sheri Wood, female   DOB: [birth year], 45 y.o.   MRN: [id number]  HPI patient states she's doing real well with her foot with minimal pain or swelling   Review of Systems     Objective:   Physical Exam Neurovascular status intact negative Homans sign noted with well coapted incision sites left first metatarsal and digit. Patient has good digital perfusion well oriented and good range of motion of the first MPJ    Assessment:     Doing well post osteotomy hammertoe repair left    Plan:     H&P x-rays reviewed and discussed continued immobilization compression elevation. Reappoint 2 weeks for suture removal or earlier if needed  X-ray report indicates osteotomy is healing well with good alignment noted and pins in place

## 2016-01-20 ENCOUNTER — Encounter: Payer: Self-pay | Admitting: Nurse Practitioner

## 2016-01-20 ENCOUNTER — Other Ambulatory Visit: Payer: Self-pay | Admitting: Nurse Practitioner

## 2016-01-20 ENCOUNTER — Encounter: Payer: Self-pay | Admitting: Podiatry

## 2016-01-20 MED ORDER — GABAPENTIN 100 MG PO CAPS: ORAL_CAPSULE | ORAL | 2 refills | Status: DC

## 2016-01-25 NOTE — Progress Notes (Signed)
DOS 12.05.2017 Austin Bunionectomy (cutting and moving bone) with Pin Fixation Left; Distal Arthroplasty 3rd Left

## 2016-01-28 ENCOUNTER — Other Ambulatory Visit: Payer: Self-pay | Admitting: Nurse Practitioner

## 2016-01-28 ENCOUNTER — Telehealth (HOSPITAL_COMMUNITY): Payer: Self-pay | Admitting: *Deleted

## 2016-01-28 MED ORDER — GABAPENTIN 600 MG PO TABS: 600.0000 mg | ORAL_TABLET | Freq: Every day | ORAL | 2 refills | Status: DC

## 2016-01-28 NOTE — Telephone Encounter (Signed)
Phone call from patient, need refills.   Last seen 12/31/14.   Canceled 02/13/15 appointment.

## 2016-02-02 ENCOUNTER — Encounter: Payer: Self-pay | Admitting: Nurse Practitioner

## 2016-02-02 ENCOUNTER — Other Ambulatory Visit: Payer: Self-pay | Admitting: Nurse Practitioner

## 2016-02-02 DIAGNOSIS — G2581 Restless legs syndrome: Secondary | ICD-10-CM

## 2016-02-02 DIAGNOSIS — M797 Fibromyalgia: Secondary | ICD-10-CM

## 2016-02-03 ENCOUNTER — Ambulatory Visit (INDEPENDENT_AMBULATORY_CARE_PROVIDER_SITE_OTHER): Payer: BC Managed Care – PPO

## 2016-02-03 ENCOUNTER — Ambulatory Visit (INDEPENDENT_AMBULATORY_CARE_PROVIDER_SITE_OTHER): Payer: Self-pay | Admitting: Podiatry

## 2016-02-03 DIAGNOSIS — M2042 Other hammer toe(s) (acquired), left foot: Secondary | ICD-10-CM

## 2016-02-03 DIAGNOSIS — M2012 Hallux valgus (acquired), left foot: Secondary | ICD-10-CM

## 2016-02-03 NOTE — Telephone Encounter (Signed)
Pt called and left message stating she need refills for her medications Dr. Tenny Crawoss put her on. Pt was last seen on 12-31-2014. Pt was scheduled to return for a f/u on 02-13-2015 but she cancelled and never rescheduled any f/u with provider. Per pt she is out refills and wants refills. Please advise. Pt number is [phone]

## 2016-02-04 NOTE — Progress Notes (Signed)
Subjective:     Patient ID: Sheri GalasJennifer L Kington, female   DOB: [birth year], 45 y.o.   MRN: [id number]  HPI patient states she's doing well with the foot with minimal discomfort and is able to walk without pain currently   Review of Systems     Objective:   Physical Exam Neurovascular status intact negative Homans sign was noted with patient's left foot exhibiting good healing of the osteotomy and good digital correction third left with wound edges well coapted stitches in place    Assessment:     Doing well post osteotomy left first metatarsal and digital repair third left    Plan:     H&P condition reviewed and recommended continuation of conservative care consisting of elevation compression and immobilization. Stitches removed and the third digit left in the wound edges are well coapted and she'll be seen back in the next 3-4 weeks for reevaluation or earlier if needed  X-ray report indicated osteotomy is healing well pins are in place and good alignment third digit

## 2016-02-04 NOTE — Telephone Encounter (Signed)
Patient has not been seen for over a year so will need appt

## 2016-02-04 NOTE — Telephone Encounter (Signed)
Pt called and appt was made for her.

## 2016-02-05 ENCOUNTER — Other Ambulatory Visit (HOSPITAL_COMMUNITY): Payer: Self-pay | Admitting: Psychiatry

## 2016-02-10 ENCOUNTER — Telehealth (HOSPITAL_COMMUNITY): Payer: Self-pay | Admitting: *Deleted

## 2016-02-10 NOTE — Telephone Encounter (Signed)
Spoke with pt and a sooner appt was made for pt to get her insurance

## 2016-02-10 NOTE — Telephone Encounter (Signed)
voice message from patient regarding her med. refill.  She said it's been two weeks.

## 2016-02-11 ENCOUNTER — Ambulatory Visit (INDEPENDENT_AMBULATORY_CARE_PROVIDER_SITE_OTHER): Payer: BC Managed Care – PPO | Admitting: Psychiatry

## 2016-02-11 ENCOUNTER — Encounter (HOSPITAL_COMMUNITY): Payer: Self-pay | Admitting: Psychiatry

## 2016-02-11 VITALS — BP 118/60 | HR 73

## 2016-02-11 DIAGNOSIS — Z882 Allergy status to sulfonamides status: Secondary | ICD-10-CM

## 2016-02-11 DIAGNOSIS — Z813 Family history of other psychoactive substance abuse and dependence: Secondary | ICD-10-CM

## 2016-02-11 DIAGNOSIS — F411 Generalized anxiety disorder: Secondary | ICD-10-CM | POA: Diagnosis not present

## 2016-02-11 DIAGNOSIS — Z91030 Bee allergy status: Secondary | ICD-10-CM

## 2016-02-11 DIAGNOSIS — F320 Major depressive disorder, single episode, mild: Secondary | ICD-10-CM

## 2016-02-11 DIAGNOSIS — F429 Obsessive-compulsive disorder, unspecified: Secondary | ICD-10-CM

## 2016-02-11 DIAGNOSIS — Z818 Family history of other mental and behavioral disorders: Secondary | ICD-10-CM

## 2016-02-11 DIAGNOSIS — Z888 Allergy status to other drugs, medicaments and biological substances status: Secondary | ICD-10-CM

## 2016-02-11 DIAGNOSIS — Z811 Family history of alcohol abuse and dependence: Secondary | ICD-10-CM

## 2016-02-11 DIAGNOSIS — Z79899 Other long term (current) drug therapy: Secondary | ICD-10-CM

## 2016-02-11 MED ORDER — ALPRAZOLAM 1 MG PO TABS: 1.0000 mg | ORAL_TABLET | Freq: Every day | ORAL | 2 refills | Status: DC | PRN

## 2016-02-11 MED ORDER — BUPROPION HCL ER (SR) 150 MG PO TB12: 150.0000 mg | ORAL_TABLET | Freq: Two times a day (BID) | ORAL | 5 refills | Status: DC

## 2016-02-11 NOTE — Progress Notes (Signed)
Patient ID: Sheri GalasJennifer L Wood, female   DOB: 01/09/1971, 46 y.o.   MRN: [id number] Patient ID: Sheri GalasJennifer L Wood, female   DOB: 01/09/1971, 46 y.o.   MRN: [id number] Patient ID: Sheri GalasJennifer L Wood, female   DOB: 01/09/1971, 46 y.o.   MRN: [id number] Patient ID: Sheri GalasJennifer L Wood, female   DOB: 01/09/1971, 46 y.o.   MRN: [id number] Patient ID: Sheri GalasJennifer L Mcginnis, female   DOB: 01/09/1971, 46 y.o.   MRN: [id number] Patient ID: Sheri GalasJennifer L Dobbin, female   DOB: 01/09/1971, 46 y.o.   MRN: [id number]  Psychiatric Assessment Adult  Patient Identification:  Sheri GalasJennifer L Rayburn Date of Evaluation:  02/11/2016 Chief Complaint: "I'm doing well" History of Chief Complaint:   Chief Complaint  Patient presents with  . Depression  . Follow-up    Depression         Associated symptoms include fatigue, myalgias and headaches.  Past medical history includes anxiety.   Anxiety  Symptoms include nervous/anxious behavior.     this patient is a 46 year old married white female who lives with her husband 47 year old daughter, 47 year-old grandson and 47 year old son in [place]. Her son is actually in college at [employer]. 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  The patient was referred by Dr. Gerda DissLuking, her primary physician for further assessment and treatment of depression and anxiety.  The patient states she's had difficulties with depression and anxiety beginning about 18 years ago. She's always been a somewhat obsessional worried person he has a child. In childhood she would do rituals like counting and touching things in a certain way. She got older and had her own children she began to worry constantly about their safety and about germs.  When she was about 46 years old she started having panic attacks and depression. She had seen her primary doctor as well as Dr. Jennelle Humanottle and Dr. Duard BradyKahr, both psychiatrist in [place]. She's been tried on numerous antidepressants including Zoloft,  Celexa, Effexor, Prozac, Wellbutrin and amitriptyline. We will work for a while and then stop. Since getting on these medicines she's also gained 60 pounds. Most recently she was tried on imipramine. When she got up to the 50 mg dosage she was unable to sleep. She also takes lorazepam which helps some with anxiety but she only takes it as needed. She's never had a psychiatric hospitalization. She's had individual and couples counseling but it has been several years ago.  The patient states that it current symptoms are exacerbated by work stress. She has a great deal of data to compile at her job and feels overwhelmed. She often has meltdowns and begins crying at work. At home her daughter has moved in with her baby after her husband left her. The patient and her husband are feeling this financial strain of caring for them as well. The patient states that she has crying spells and feels depressed she eats too muchShe has never been suicidal or engaged in self-harm. She does not use drugs or alcohol. Her symptoms of depression irritability are much worse around the time of her menses. She suffers from migraine headaches which seem hormonally related as well. She also has fibromyalgia and restless leg syndrome and overall has little energy to do much of anything  The patient returns after a long absence. She was last seen here 14 months ago. She stayed on Wellbutrin all this time but didn't tolerate the Prozac well. She still uses Xanax periodically when she feels  anxious. Overall things are going fairly well in her life and she denies any serious symptoms of depression. Her daughter finished cosmetology school and has found a job and her son is doing well in college Review of Systems  Constitutional: Positive for fatigue.  Eyes: Negative.   Respiratory: Negative.   Cardiovascular: Negative.   Gastrointestinal: Negative.   Endocrine: Negative.   Genitourinary: Negative.   Musculoskeletal: Positive for  arthralgias, myalgias and neck pain.  Skin: Negative.   Allergic/Immunologic: Negative.   Neurological: Positive for syncope and headaches.  Hematological: Negative.   Psychiatric/Behavioral: Positive for depression, dysphoric mood and sleep disturbance. The patient is nervous/anxious.    Physical Exam  Depressive Symptoms: depressed mood, anhedonia, psychomotor agitation, fatigue, difficulty concentrating, impaired memory, anxiety, panic attacks, disturbed sleep, weight gain,  (Hypo) Manic Symptoms:   Elevated Mood:  No Irritable Mood:  Yes Grandiosity:  No Distractibility:  No Labiality of Mood:  Yes Delusions:  No Hallucinations:  No Impulsivity:  No Sexually Inappropriate Behavior:  No Financial Extravagance:  No Flight of Ideas:  No  Anxiety Symptoms: Excessive Worry:  Yes Panic Symptoms:  Yes Agoraphobia:  No Obsessive Compulsive: Yes  Symptoms: Handwashing, Obsessive worrying, checking doors and windows Specific Phobias:  No Social Anxiety:  No  Psychotic Symptoms:  Hallucinations: No None Delusions:  No Paranoia:  No   Ideas of Reference:  No  PTSD Symptoms: Ever had a traumatic exposure:  No Had a traumatic exposure in the last month:  No Re-experiencing: No None Hypervigilance:  No Hyperarousal: No None Avoidance: No None  Traumatic Brain Injury: No   Past Psychiatric History: Diagnosis: Maj. depression, generalized anxiety disorder, OCD   Hospitalizations: none  Outpatient Care: Has seen 2 psychiatrists in [place] the past   Substance Abuse Care: none  Self-Mutilation: none  Suicidal Attempts: none  Violent Behaviors: none   Past Medical History:   Past Medical History:  Diagnosis Date  . Allergy    seasonal  . Anxiety   . Depression   . Fibromyalgia   . Gastritis    history  . GERD (gastroesophageal reflux disease)    otc med prn  . IBS (irritable bowel syndrome)   . Migraine   . SVD (spontaneous vaginal delivery)    x  2  . Urticaria, chronic   . Vertigo    History of Loss of Consciousness:  No Seizure History:  No Cardiac History:  No Allergies:   Allergies  Allergen Reactions  . Ciprofloxacin Other (See Comments)    Severe muscle and joint pain  . Cefzil [Cefprozil]     possible hives  . Augmentin [Amoxicillin-Pot Clavulanate] Nausea And Vomiting    Throat felt funny and gave pt anxiety  . Bee Venom Swelling  . Sulfa Antibiotics Other (See Comments)    Abdominal cramping.   Current Medications:  Current Outpatient Prescriptions  Medication Sig Dispense Refill  . meclizine (ANTIVERT) 25 MG tablet TAKE ONE TABLET BY MOUTH THREE TIMES DAILY AS NEEDED FOR DIZZINESS. 30 tablet 5  . NON FORMULARY Taking clabetazol Cream twice a week    . pantoprazole (PROTONIX) 40 MG tablet Take 1 tablet (40 mg total) by mouth daily. 30 tablet 0  . RELPAX 40 MG tablet TAKE 1 TABLET BY MOUTH AT ONSET OF MIGRAINE, MAY REPEAT ONCE IN 2 HOURS LATER. NO MORE THAN 2 A DAY. 8 tablet 5  . ALPRAZolam (XANAX) 1 MG tablet Take 1 tablet (1 mg total) by mouth daily as needed  for anxiety. 30 tablet 2  . buPROPion (WELLBUTRIN SR) 150 MG 12 hr tablet Take 1 tablet (150 mg total) by mouth 2 (two) times daily. 60 tablet 5   No current facility-administered medications for this visit.     Previous Psychotropic Medications:  Medication Dose   Zoloft, Celexa, Effexor, Prozac, Provigil, Wellbutrin, amitriptyline, imipramine, lorazepam                        Substance Abuse History in the last 12 months: Substance Age of 1st Use Last Use Amount Specific Type  Nicotine      Alcohol      Cannabis      Opiates      Cocaine      Methamphetamines      LSD      Ecstasy      Benzodiazepines      Caffeine      Inhalants      Others:                          Medical Consequences of Substance Abuse: none  Legal Consequences of Substance Abuse: none  Family Consequences of Substance Abuse: none  Blackouts:  No DT's:   No Withdrawal Symptoms:  No None  Social History: Current Place of Residence: [place] [address] of Birth: [place] Family Members: Parents 1 brother, another brother deceased Marital Status:  Married Children:   Sons: 1  Daughters: 1 Relationships:  Education:  Corporate treasurer Problems/Performance: none Religious Beliefs/Practices: Christian History of Abuse: none Armed forces technical officer; Geophysicist/field seismologist, Chief Executive Officer History:  None. Legal History: none Hobbies/Interests: Reading, furniture refinishing  Family History:   Family History  Problem Relation Age of Onset  . Alcohol abuse Brother   . Drug abuse Brother   . Depression Mother   . Anxiety disorder Mother   . Anxiety disorder Brother   . Depression Brother   . Bipolar disorder Maternal Uncle   . Anxiety disorder Paternal Grandfather   . Depression Paternal Grandfather   . Bipolar disorder Cousin     Mental Status Examination/Evaluation: Objective:  Appearance: Neat and Well Groomed  Eye Contact::  Good  Speech:  Clear and Coherent  Volume:  Normal  Mood: Good   Affect:Bright   Thought Process:  Goal Directed  Orientation:  Full (Time, Place, and Person)  Thought Content: Within normal limits   Suicidal Thoughts:  No  Homicidal Thoughts:  No  Judgement:  Good  Insight:  Good  Psychomotor Activity:  Normal  Akathisia:  No  Handed:  Right  AIMS (if indicated):    Assets:  Communication Skills Desire for Improvement Financial Resources/Insurance Social Support Vocational/Educational    Laboratory/X-Ray Psychological Evaluation(s)   Reviewed in the chart      Assessment:  Axis I: Depressive Disorder NOS, Generalized Anxiety Disorder and Obsessive Compulsive Disorder  AXIS I Depressive Disorder NOS, Generalized Anxiety Disorder and Obsessive Compulsive Disorder  AXIS II Deferred  AXIS III Past Medical History:  Diagnosis Date  . Allergy     seasonal  . Anxiety   . Depression   . Fibromyalgia   . Gastritis    history  . GERD (gastroesophageal reflux disease)    otc med prn  . IBS (irritable bowel syndrome)   . Migraine   . SVD (spontaneous vaginal delivery)    x 2  . Urticaria, chronic   . Vertigo  AXIS IV other psychosocial or environmental problems  AXIS V 51-60 moderate symptoms   Treatment Plan/Recommendations:  Plan of Care: Medication management   Laboratory:    Psychotherapy: This was recommended but she declined at this time   Medications: She'll continue  Wellbutrin SR 150 mg twice a day . she will Continue Xanax 1 mg daily as needed for anxiety   Routine PRN Medications:  No  Consultations:   Safety Concerns:  She denies thoughts of self-harm   Other:  She'll return in 6 Months     Emya Picado, Gavin Pound, MD 1/4/20182:05 PM

## 2016-02-17 ENCOUNTER — Ambulatory Visit (HOSPITAL_COMMUNITY): Payer: Self-pay | Admitting: Psychiatry

## 2016-02-19 ENCOUNTER — Other Ambulatory Visit: Payer: Self-pay | Admitting: Nurse Practitioner

## 2016-03-01 ENCOUNTER — Other Ambulatory Visit: Payer: Self-pay | Admitting: Family Medicine

## 2016-03-02 NOTE — Telephone Encounter (Signed)
02/03/16 MyChart msg from you states con't Mirapex.  It is not listed on current med list. Please advise.

## 2016-03-08 ENCOUNTER — Encounter: Payer: Self-pay | Admitting: Nurse Practitioner

## 2016-03-09 ENCOUNTER — Ambulatory Visit (INDEPENDENT_AMBULATORY_CARE_PROVIDER_SITE_OTHER): Payer: Self-pay | Admitting: Podiatry

## 2016-03-09 ENCOUNTER — Ambulatory Visit (INDEPENDENT_AMBULATORY_CARE_PROVIDER_SITE_OTHER): Payer: BC Managed Care – PPO

## 2016-03-09 ENCOUNTER — Other Ambulatory Visit: Payer: Self-pay | Admitting: Nurse Practitioner

## 2016-03-09 DIAGNOSIS — M2012 Hallux valgus (acquired), left foot: Secondary | ICD-10-CM | POA: Diagnosis not present

## 2016-03-09 DIAGNOSIS — M2042 Other hammer toe(s) (acquired), left foot: Secondary | ICD-10-CM | POA: Diagnosis not present

## 2016-03-10 NOTE — Progress Notes (Signed)
Subjective:     Patient ID: Sheri GalasJennifer L Wood, female   DOB: 05-Oct-1970, 46 y.o.   MRN: [id number]  HPI patient states I'm doing fine with my and occasional discomfort in the third digit of the left foot   Review of Systems     Objective:   Physical Exam Neurovascular status intact negative Homans sign was noted with well-healing surgical site first metatarsal left with third digit left showing mild to moderate edema and a slight bump on the medial side which should resolve    Assessment:     Overall doing well from forefoot surgery left    Plan:     X-ray reviewed gradual return to shoes and soaks and we may have to do a small cortisone injection third toe remains irritative for her but I would give it at least 8 weeks before considering this  X-ray report indicated osteotomy is healing well pins are in place no signs of movement

## 2016-03-11 ENCOUNTER — Other Ambulatory Visit: Payer: Self-pay | Admitting: Nurse Practitioner

## 2016-03-11 MED ORDER — FLUCONAZOLE 150 MG PO TABS: ORAL_TABLET | ORAL | 0 refills | Status: DC

## 2016-03-11 MED ORDER — SULFAMETHOXAZOLE-TRIMETHOPRIM 800-160 MG PO TABS: 1.0000 | ORAL_TABLET | Freq: Two times a day (BID) | ORAL | 0 refills | Status: DC

## 2016-03-21 ENCOUNTER — Telehealth: Payer: Self-pay | Admitting: Nurse Practitioner

## 2016-03-21 NOTE — Telephone Encounter (Signed)
Spoke with patient who states some sulfa antibiotics cause abdominal cramping but patient states she would like to try this med and will follow up if further problems. Patient states she will contact the pharmacy.

## 2016-03-21 NOTE — Telephone Encounter (Signed)
Pharmacy is stating that the bactrim that was called in causes the pt abd. Pain so they will not fill it. Please advise.

## 2016-03-21 NOTE — Telephone Encounter (Signed)
noted 

## 2016-03-30 ENCOUNTER — Encounter: Payer: Self-pay | Admitting: Nurse Practitioner

## 2016-03-31 ENCOUNTER — Other Ambulatory Visit: Payer: Self-pay | Admitting: Nurse Practitioner

## 2016-03-31 MED ORDER — TOPIRAMATE 50 MG PO TABS: ORAL_TABLET | ORAL | 0 refills | Status: DC

## 2016-04-06 ENCOUNTER — Ambulatory Visit (INDEPENDENT_AMBULATORY_CARE_PROVIDER_SITE_OTHER): Payer: BC Managed Care – PPO | Admitting: Podiatry

## 2016-04-06 ENCOUNTER — Ambulatory Visit (INDEPENDENT_AMBULATORY_CARE_PROVIDER_SITE_OTHER): Payer: BC Managed Care – PPO

## 2016-04-06 ENCOUNTER — Encounter: Payer: Self-pay | Admitting: Podiatry

## 2016-04-06 DIAGNOSIS — M2012 Hallux valgus (acquired), left foot: Secondary | ICD-10-CM

## 2016-04-07 NOTE — Progress Notes (Addendum)
Subjective:     Patient ID: Sheri GalasJennifer L Bennette, female   DOB: 12/05/1970, 46 y.o.   MRN: [id number]  HPI patient presents stating I'm doing well but I'm getting some irritation in my third toe   Review of Systems     Objective:   Physical Exam Neurovascular status intact with patient's third toe being slightly dorsiflexed with wound edges well coapted and good plantar alignment of the digit with the hallux and second toe doing well    Assessment:     Irritation of the distal third toe which am hoping is secondary to swelling    Plan:     Reviewed condition and recommended padding which was applied today and hopefully will reduce as far as the inflammation goes and may require steroidal injection if symptoms persist. Reappoint 8 weeks  X-ray indicates good osteotomy healing first metatarsal second digit with distal phalanx looking good on the left with possibility for slight dorsiflex condition of the distal toe

## 2016-05-04 ENCOUNTER — Ambulatory Visit: Payer: BC Managed Care – PPO | Admitting: Podiatry

## 2016-05-07 ENCOUNTER — Other Ambulatory Visit: Payer: Self-pay | Admitting: Family Medicine

## 2016-05-12 ENCOUNTER — Other Ambulatory Visit: Payer: Self-pay | Admitting: Nurse Practitioner

## 2016-05-16 ENCOUNTER — Other Ambulatory Visit: Payer: Self-pay | Admitting: Nurse Practitioner

## 2016-05-18 ENCOUNTER — Ambulatory Visit: Payer: BC Managed Care – PPO | Admitting: Podiatry

## 2016-05-26 ENCOUNTER — Ambulatory Visit (INDEPENDENT_AMBULATORY_CARE_PROVIDER_SITE_OTHER): Payer: BC Managed Care – PPO | Admitting: Podiatry

## 2016-05-26 ENCOUNTER — Ambulatory Visit (INDEPENDENT_AMBULATORY_CARE_PROVIDER_SITE_OTHER): Payer: BC Managed Care – PPO

## 2016-05-26 ENCOUNTER — Ambulatory Visit: Payer: BC Managed Care – PPO

## 2016-05-26 ENCOUNTER — Encounter: Payer: Self-pay | Admitting: Podiatry

## 2016-05-26 DIAGNOSIS — Z472 Encounter for removal of internal fixation device: Secondary | ICD-10-CM | POA: Diagnosis not present

## 2016-05-26 DIAGNOSIS — M2042 Other hammer toe(s) (acquired), left foot: Secondary | ICD-10-CM

## 2016-05-26 DIAGNOSIS — M2012 Hallux valgus (acquired), left foot: Secondary | ICD-10-CM | POA: Diagnosis not present

## 2016-05-26 MED ORDER — DIAZEPAM 10 MG PO TABS: 10.0000 mg | ORAL_TABLET | Freq: Two times a day (BID) | ORAL | 0 refills | Status: DC | PRN

## 2016-05-27 ENCOUNTER — Telehealth: Payer: Self-pay | Admitting: *Deleted

## 2016-05-27 NOTE — Progress Notes (Signed)
Subjective:     Patient ID: Sheri Wood, female   DOB: Oct 09, 1970, 45 y.o.   MRN: [id number]  HPI patient presents stating she's having trouble the bottom of her third toe left and she has a prominent pin position left that bothers her. States that the toe feels like it hyperextends when she gets pain under the toe and it's bothers him with shoe gear   Review of Systems     Objective:   Physical Exam Neurovascular status intact with good structural correction first metatarsal left second digit with third digit that a small amount of bone was taken from the middle phalanx but she seems to have developed hypermobility and it and it's pressing down the proximal phalanx to the ground along with abnormal pin position    Assessment:     Probability for trauma to the left third toe with prominent lesion formation and slightly displaced interphalangeal joint with abnormal pin position left    Plan:     H&P and conditions discussed at great length. This is a difficult problem there is absolutely no long-term guarantees Akin solve it but I have recommended taking a plantar skin wedge at the interphalangeal joint to try to reduce the hypermobility and take out a small section of bone head of proximal hallux to reduce the pressure against the toe. I then recommended removal of the pin and she needs to do this in the office for financial reasons and we will do this in the office and I allowed her to read consent form going over alternative treatments complications and the fact there is absolutely no long-term guarantees this will solve the problem. Patient wants surgery and understands total recovery can take approximate 6 months and signs consent form and is written a prescription for Valium to take prior to the surgery and she will have someone bring her

## 2016-05-27 NOTE — Telephone Encounter (Signed)
Per Dr. Charlsie Merles, I am calling you to schedule your office surgery.  "Okay, I can do it any day except May 18."  He can do it any Friday.  "I will do it on May 4.  I can eat that morning correct?"  Yes, you can eat that morning.

## 2016-05-30 ENCOUNTER — Other Ambulatory Visit: Payer: Self-pay | Admitting: Family Medicine

## 2016-05-31 ENCOUNTER — Encounter: Payer: Self-pay | Admitting: Nurse Practitioner

## 2016-06-01 ENCOUNTER — Other Ambulatory Visit: Payer: Self-pay | Admitting: Nurse Practitioner

## 2016-06-01 MED ORDER — METOPROLOL SUCCINATE ER 25 MG PO TB24: 25.0000 mg | ORAL_TABLET | Freq: Every day | ORAL | 2 refills | Status: DC

## 2016-06-10 ENCOUNTER — Encounter: Payer: Self-pay | Admitting: Podiatry

## 2016-06-10 ENCOUNTER — Ambulatory Visit (INDEPENDENT_AMBULATORY_CARE_PROVIDER_SITE_OTHER): Payer: BC Managed Care – PPO | Admitting: Podiatry

## 2016-06-10 VITALS — BP 94/60 | HR 86 | Temp 97.0°F | Resp 16

## 2016-06-10 DIAGNOSIS — Z472 Encounter for removal of internal fixation device: Secondary | ICD-10-CM | POA: Diagnosis not present

## 2016-06-10 DIAGNOSIS — M2042 Other hammer toe(s) (acquired), left foot: Secondary | ICD-10-CM

## 2016-06-10 MED ORDER — HYDROCODONE-ACETAMINOPHEN 5-325 MG PO TABS: 1.0000 | ORAL_TABLET | ORAL | 0 refills | Status: DC | PRN

## 2016-06-11 NOTE — Progress Notes (Signed)
Subjective:    Patient ID: Sheri GalasJennifer L Oyervides, female   DOB: 46 y.o.   MRN: [id number]   HPI patient presents for surgical control and procedure left foot stating that the pin bothers her and that third digit is sore on the bottom with hyper mobility of the distal phalanx would remove partial head of the middle phalanx    ROS      Objective:  Physical Exam Neurovascular status found to be intact muscle strength was adequate with patient found have prominent pin left first metatarsal and hypermobile distal joint digit 3 left with proximal enlargement at head of the proximal phalanx in the plantar direction    Assessment:   Abnormal pin position left with digital deformity third left      Plan:     Patient was injected with a total 120 mL Xylocaine Marcaine mixture and the patient was brought to the OR placed in supine position and was prepped and draped utilizing standard aseptic technique. I then exsanguinated the left foot and inflated tourniquet to 250 mm and performed the following procedures. I exposed the first metatarsal shaft with skin incision and then went through 16 tissue requiring hemostasis as necessary. I then went to capsule and sharply dissected capsule of the underlying bone revealing a prominent proximal pin of the left first metatarsal. Utilizing sharp and blunt dissection the pin was removed in toto the wound was flushed and sutured with 5-0 nylon. Attention was then directed to the third digit left for small dorsal incision of a semielliptical linear nature was made and the head of the proximal phalanx was delivered in the wound after the extensor expansion and ligaments were released and I removed the head of the proximal phalanx. I found this to correlate to the plantar protrusion that she's experiencing and I then went ahead and took the small plantar skin wedge in order to try to stabilize the distal joint and the toe was in excellent position when I finished. I applied  sterile dressing and went ahead and removed tourniquet and capillary fills noted to be immediate to all digits on the left foot with good alignment noted and patient will reappoint in 1 week or earlier if needed

## 2016-06-13 ENCOUNTER — Telehealth: Payer: Self-pay | Admitting: Podiatry

## 2016-06-13 ENCOUNTER — Encounter: Payer: Self-pay | Admitting: Nurse Practitioner

## 2016-06-13 ENCOUNTER — Encounter: Payer: Self-pay | Admitting: Podiatry

## 2016-06-13 MED ORDER — PROMETHAZINE HCL 25 MG PO TABS: ORAL_TABLET | ORAL | 0 refills | Status: DC

## 2016-06-13 NOTE — Telephone Encounter (Signed)
Pt states she is nauseous but needs something for the pain at work. Dr. Charlsie Merlesegal states take the ibuprofen and tylenol alternating at work and the vicodin at home, call in phenergan 25mg  #20 one every 4-6 hours prn nausea. I informed pt of Dr. Beverlee Nimsegal's orders.

## 2016-06-13 NOTE — Telephone Encounter (Signed)
Saw Dr. Charlsie Merlesegal this past Friday 04 May and was prescribed Vicodin. States it is making her very sick after the third one and that she is on the verge of vomiting. Stated she has been rotating tylenol and Ibuprofen and wants to know if something else can be called in. Patient states she needs something. Please call at work [phone] until 3:30 pm or mobile number [phone] after 3:30 pm today.

## 2016-06-15 ENCOUNTER — Ambulatory Visit (INDEPENDENT_AMBULATORY_CARE_PROVIDER_SITE_OTHER): Payer: Self-pay | Admitting: Podiatry

## 2016-06-15 ENCOUNTER — Ambulatory Visit (INDEPENDENT_AMBULATORY_CARE_PROVIDER_SITE_OTHER): Payer: BC Managed Care – PPO

## 2016-06-15 DIAGNOSIS — M2042 Other hammer toe(s) (acquired), left foot: Secondary | ICD-10-CM | POA: Diagnosis not present

## 2016-06-15 DIAGNOSIS — Z9889 Other specified postprocedural states: Secondary | ICD-10-CM | POA: Diagnosis not present

## 2016-06-29 ENCOUNTER — Ambulatory Visit (INDEPENDENT_AMBULATORY_CARE_PROVIDER_SITE_OTHER): Payer: Self-pay | Admitting: Podiatry

## 2016-06-29 ENCOUNTER — Encounter: Payer: Self-pay | Admitting: Podiatry

## 2016-06-29 DIAGNOSIS — Z472 Encounter for removal of internal fixation device: Secondary | ICD-10-CM

## 2016-06-29 DIAGNOSIS — M2042 Other hammer toe(s) (acquired), left foot: Secondary | ICD-10-CM

## 2016-06-29 DIAGNOSIS — Z9889 Other specified postprocedural states: Secondary | ICD-10-CM

## 2016-06-30 ENCOUNTER — Ambulatory Visit (INDEPENDENT_AMBULATORY_CARE_PROVIDER_SITE_OTHER): Payer: BC Managed Care – PPO | Admitting: Nurse Practitioner

## 2016-06-30 ENCOUNTER — Encounter: Payer: Self-pay | Admitting: Nurse Practitioner

## 2016-06-30 VITALS — BP 110/62 | Ht 67.0 in | Wt 199.2 lb

## 2016-06-30 DIAGNOSIS — F5081 Binge eating disorder: Secondary | ICD-10-CM | POA: Diagnosis not present

## 2016-06-30 DIAGNOSIS — Z79899 Other long term (current) drug therapy: Secondary | ICD-10-CM | POA: Diagnosis not present

## 2016-06-30 MED ORDER — LISDEXAMFETAMINE DIMESYLATE 30 MG PO CAPS: 30.0000 mg | ORAL_CAPSULE | Freq: Every day | ORAL | 0 refills | Status: DC

## 2016-07-01 ENCOUNTER — Encounter: Payer: Self-pay | Admitting: Nurse Practitioner

## 2016-07-01 NOTE — Progress Notes (Signed)
Subjective:  Presents to discuss possible binge eating disorder. Would like to try Vyvanse. Has taken the questionnaire online before visit. Given a copy during the visit, CT scan report. Patient scores are very high for binge eating disorder. Denies any cardiac issues. Note she did not start metoprolol for her headaches, states concerned about potential adverse effects but nonspecific.  Objective:   BP 110/62   Ht 5\' 7"  (1.702 m)   Wt 199 lb 3.2 oz (90.4 kg)   BMI 31.20 kg/m  NAD. Alert, oriented. Lungs clear. Heart regular rate rhythm. No murmur or gallop noted. EKG normal.  Assessment:   Problem List Items Addressed This Visit      Other   Binge eating disorder - Primary    Other Visit Diagnoses    High risk medication use       Relevant Orders   PR ELECTROCARDIOGRAM, COMPLETE       Plan:   Meds ordered this encounter  Medications  . lisdexamfetamine (VYVANSE) 30 MG capsule    Sig: Take 1 capsule (30 mg total) by mouth daily.    Dispense:  30 capsule    Refill:  0    Order Specific Question:   Supervising Provider    Answer:   Merlyn AlbertLUKING, WILLIAM S [[id number]]   Trial of Vyvanse. Given written and verbal information on potential adverse effects. DC med and call if any problems.  Return in about 3 months (around 09/30/2016) for recheck. Call within one month for refills and possible dosage adjustment.

## 2016-07-02 NOTE — Progress Notes (Signed)
Subjective:    Patient ID: Sheri GalasJennifer L Jarriel, female   DOB: 46 y.o.   MRN: [id number]   HPI patient states she's doing well and she's extremely happy with the position of her toe but she did stumble on her toes several weeks ago    ROS      Objective:  Physical Exam Neurovascular status intact with good alignment of the second and third digits left with excellent position of the third toe after plantar excision of tissue. The patient overall seems to be making good progress stitches intact    Assessment:    Doing well post digital surgery and screw removal     Plan:    Reviewed condition and at this time patient had stitches removed wound is made coapted well and I instructed on gradual return soft shoe over the next couple weeks and reappoint again in 4 weeks  X-rays were satisfactory with good position of the third toe noted

## 2016-07-07 ENCOUNTER — Encounter: Payer: Self-pay | Admitting: Nurse Practitioner

## 2016-07-07 NOTE — Progress Notes (Signed)
Subjective:    Patient ID: Sheri Wood, female   DOB: 46 y.o.   MRN: [id number]   HPI patient presents stating she's doing very well    ROS      Objective:  Physical Exam neurovascular status intact with patient's left foot doing well with wound edges well coapted and good alignment     Assessment:    Doing well postoperatively     Plan:    X-rays reviewed sterile dressings reapplied and reappoint to recheck

## 2016-07-08 NOTE — Progress Notes (Signed)
Office Surgery DOS 05.04.2018 Arthroplasty 3rd digit left, removal plantar skin wedge left, removal pin left

## 2016-07-14 ENCOUNTER — Encounter: Payer: Self-pay | Admitting: Family Medicine

## 2016-07-18 ENCOUNTER — Telehealth: Payer: Self-pay | Admitting: Family Medicine

## 2016-07-18 NOTE — Telephone Encounter (Signed)
Rx prior auth APPROVED for pt's lisdexamfetamine (VYVANSE) 30 MG capsule   Valid 07/18/16 - 07/18/17 per [facility], faxed approval letter to [facility] & sent to be scanned in to the chart

## 2016-07-19 ENCOUNTER — Encounter: Payer: Self-pay | Admitting: Nurse Practitioner

## 2016-07-20 ENCOUNTER — Other Ambulatory Visit: Payer: Self-pay | Admitting: Nurse Practitioner

## 2016-07-20 MED ORDER — LISDEXAMFETAMINE DIMESYLATE 60 MG PO CAPS: 60.0000 mg | ORAL_CAPSULE | ORAL | 0 refills | Status: DC

## 2016-07-21 ENCOUNTER — Ambulatory Visit (INDEPENDENT_AMBULATORY_CARE_PROVIDER_SITE_OTHER): Payer: BC Managed Care – PPO | Admitting: Family Medicine

## 2016-07-21 ENCOUNTER — Encounter: Payer: Self-pay | Admitting: Family Medicine

## 2016-07-21 VITALS — BP 112/74 | Temp 98.0°F | Ht 67.0 in | Wt 195.0 lb

## 2016-07-21 DIAGNOSIS — K2900 Acute gastritis without bleeding: Secondary | ICD-10-CM | POA: Diagnosis not present

## 2016-07-21 DIAGNOSIS — R109 Unspecified abdominal pain: Secondary | ICD-10-CM | POA: Diagnosis not present

## 2016-07-21 LAB — POCT URINALYSIS DIPSTICK
Spec Grav, UA: 1.015 (ref 1.010–1.025)
pH, UA: 7 (ref 5.0–8.0)

## 2016-07-21 MED ORDER — PANTOPRAZOLE SODIUM 40 MG PO TBEC: 40.0000 mg | DELAYED_RELEASE_TABLET | Freq: Every day | ORAL | 0 refills | Status: DC

## 2016-07-21 MED ORDER — SUCRALFATE 1 G PO TABS: 1.0000 g | ORAL_TABLET | Freq: Three times a day (TID) | ORAL | 0 refills | Status: DC

## 2016-07-21 NOTE — Progress Notes (Signed)
   Subjective:    Patient ID: Sheri GalasJennifer L Wood, female    DOB: [birth year], 46 y.o.   MRN: [id number]  Abdominal Pain  This is a new problem. Episode onset: one week. Associated symptoms include diarrhea. Associated symptoms comments: chills. The pain is aggravated by eating. Treatments tried: mylanta. The treatment provided mild relief.   Hx of abd pain and had to get ct scan  Ct was neg  Called it ibs  Last wk had a bad flare, almost went to the e r  Hx of gastritis and ibuprofen   Some reflux hx    'usually b m's are constipation mormally, but can at times go the other way  Extremely shrp in episgastrium  And rad to low abd  stilll has gall bladr  fam hx of diverticulosis  Pt had a clon 15 yrs ago sec to bleedign   occas cramping thru weekend  Pt took mylanta prn   mylanta hleped a bit  No nausea  Only one bout diarrhea     Review of Systems  Gastrointestinal: Positive for abdominal pain and diarrhea.       Objective:   Physical Exam Alert and oriented, vitals reviewed and stable, NAD ENT-TM's and ext canals WNL bilat via otoscopic exam Soft palate, tonsils and post pharynx WNL via oropharyngeal exam Neck-symmetric, no masses; thyroid nonpalpable and nontender Pulmonary-no tachypnea or accessory muscle use; Clear without wheezes via auscultation Card--no abnrml murmurs, rhythm reg and rate WNL Carotid pulses symmetric, without bruits Abdomen diffusely mildly tender but distinctly tender in the epigastrium. No masses no rebound no guarding       Assessment & Plan:  Impression chronic reflux now worsening intake gastritis discussed at length. Plan switch from omeprazole to Protonix. Local measures discussed. Carafate before meals name as symptom care discussed the pain persists beyond this will need a GI referral  Greater than 50% of this 25 minute face to face visit was spent in counseling and discussion and coordination of care regarding the  above diagnosis/diagnosies

## 2016-07-26 ENCOUNTER — Other Ambulatory Visit: Payer: Self-pay | Admitting: Family Medicine

## 2016-07-27 ENCOUNTER — Other Ambulatory Visit: Payer: Self-pay | Admitting: Nurse Practitioner

## 2016-07-28 ENCOUNTER — Ambulatory Visit (INDEPENDENT_AMBULATORY_CARE_PROVIDER_SITE_OTHER): Payer: BC Managed Care – PPO

## 2016-07-28 ENCOUNTER — Ambulatory Visit (INDEPENDENT_AMBULATORY_CARE_PROVIDER_SITE_OTHER): Payer: Self-pay | Admitting: Podiatry

## 2016-07-28 DIAGNOSIS — Z9889 Other specified postprocedural states: Secondary | ICD-10-CM

## 2016-07-28 DIAGNOSIS — M2042 Other hammer toe(s) (acquired), left foot: Secondary | ICD-10-CM

## 2016-07-28 DIAGNOSIS — Z472 Encounter for removal of internal fixation device: Secondary | ICD-10-CM | POA: Diagnosis not present

## 2016-07-28 NOTE — Progress Notes (Signed)
Subjective:    Patient ID: Sheri GalasJennifer L Hulet, female   DOB: 46 y.o.   MRN: [id number]   HPI patient states she's doing pretty well but still having some discomfort in the toe    ROS      Objective:  Physical Exam neurovascular status intact with patient's third digit left being mildly swollen and discomforting when palpated     Assessment:   Discomfort still noted third toe left but improving      Plan:    X-ray reviewed patient's very pleased and them mild discomfort she is experiencing should get better over the next few weeks. Reappoint in the next 4 weeks as needed  X-ray indicates satisfactory resection of bone

## 2016-08-09 ENCOUNTER — Ambulatory Visit (INDEPENDENT_AMBULATORY_CARE_PROVIDER_SITE_OTHER): Payer: BC Managed Care – PPO | Admitting: Psychiatry

## 2016-08-09 ENCOUNTER — Other Ambulatory Visit: Payer: Self-pay | Admitting: Nurse Practitioner

## 2016-08-09 ENCOUNTER — Encounter (HOSPITAL_COMMUNITY): Payer: Self-pay | Admitting: Psychiatry

## 2016-08-09 VITALS — BP 110/72 | HR 76 | Ht 67.0 in | Wt 191.0 lb

## 2016-08-09 DIAGNOSIS — Z813 Family history of other psychoactive substance abuse and dependence: Secondary | ICD-10-CM

## 2016-08-09 DIAGNOSIS — R5383 Other fatigue: Secondary | ICD-10-CM | POA: Diagnosis not present

## 2016-08-09 DIAGNOSIS — F411 Generalized anxiety disorder: Secondary | ICD-10-CM | POA: Diagnosis not present

## 2016-08-09 DIAGNOSIS — Z818 Family history of other mental and behavioral disorders: Secondary | ICD-10-CM | POA: Diagnosis not present

## 2016-08-09 DIAGNOSIS — M791 Myalgia: Secondary | ICD-10-CM | POA: Diagnosis not present

## 2016-08-09 DIAGNOSIS — Z881 Allergy status to other antibiotic agents status: Secondary | ICD-10-CM | POA: Diagnosis not present

## 2016-08-09 DIAGNOSIS — F429 Obsessive-compulsive disorder, unspecified: Secondary | ICD-10-CM | POA: Diagnosis not present

## 2016-08-09 DIAGNOSIS — Z888 Allergy status to other drugs, medicaments and biological substances status: Secondary | ICD-10-CM

## 2016-08-09 DIAGNOSIS — Z79899 Other long term (current) drug therapy: Secondary | ICD-10-CM | POA: Diagnosis not present

## 2016-08-09 DIAGNOSIS — F320 Major depressive disorder, single episode, mild: Secondary | ICD-10-CM

## 2016-08-09 DIAGNOSIS — Z882 Allergy status to sulfonamides status: Secondary | ICD-10-CM

## 2016-08-09 DIAGNOSIS — Z811 Family history of alcohol abuse and dependence: Secondary | ICD-10-CM

## 2016-08-09 DIAGNOSIS — R51 Headache: Secondary | ICD-10-CM | POA: Diagnosis not present

## 2016-08-09 DIAGNOSIS — F329 Major depressive disorder, single episode, unspecified: Secondary | ICD-10-CM | POA: Diagnosis not present

## 2016-08-09 MED ORDER — BUPROPION HCL ER (SR) 150 MG PO TB12
150.0000 mg | ORAL_TABLET | Freq: Two times a day (BID) | ORAL | 5 refills | Status: DC
Start: 2016-08-09 — End: 2017-02-09

## 2016-08-09 NOTE — Progress Notes (Signed)
Patient ID: Sheri Wood, female   DOB: 03-11-1970, 46 y.o.   MRN: [id number] Patient ID: Sheri Wood, female   DOB: 1970/05/27, 46 y.o.   MRN: [id number] Patient ID: Sheri Wood, female   DOB: 1971/01/03, 46 y.o.   MRN: [id number] Patient ID: Sheri Wood, female   DOB: 1970-07-05, 46 y.o.   MRN: [id number] Patient ID: Sheri Wood, female   DOB: 05-01-1970, 46 y.o.   MRN: [id number] Patient ID: Sheri Wood, female   DOB: 1970/08/04, 46 y.o.   MRN: [id number]  Psychiatric Assessment Adult  Patient Identification:  Sheri Wood Date of Evaluation:  08/09/2016 Chief Complaint: "I'm doing well" History of Chief Complaint:   Chief Complaint  Patient presents with  . Follow-up  . Anxiety  . Depression    Depression         Associated symptoms include fatigue, myalgias and headaches.  Past medical history includes anxiety.   Anxiety  Symptoms include nervous/anxious behavior.     this patient is a 46 year old married white female who lives with her husband 29 year old daughter, 38 year-old grandson and 68 year old son in [place]. Her son is actually in college at [employer]. 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  The patient was referred by Dr. Gerda Diss, her primary physician for further assessment and treatment of depression and anxiety.  The patient states she's had difficulties with depression and anxiety beginning about 18 years ago. She's always been a somewhat obsessional worried person he has a child. In childhood she would do rituals like counting and touching things in a certain way. She got older and had her own children she began to worry constantly about their safety and about germs.  When she was about 46 years old she started having panic attacks and depression. She had seen her primary doctor as well as Dr. Jennelle Human and Dr. Duard Brady, both psychiatrist in [place]. She's been tried on numerous antidepressants including  Zoloft, Celexa, Effexor, Prozac, Wellbutrin and amitriptyline. We will work for a while and then stop. Since getting on these medicines she's also gained 60 pounds. Most recently she was tried on imipramine. When she got up to the 50 mg dosage she was unable to sleep. She also takes lorazepam which helps some with anxiety but she only takes it as needed. She's never had a psychiatric hospitalization. She's had individual and couples counseling but it has been several years ago.  The patient states that it current symptoms are exacerbated by work stress. She has a great deal of data to compile at her job and feels overwhelmed. She often has meltdowns and begins crying at work. At home her daughter has moved in with her baby after her husband left her. The patient and her husband are feeling this financial strain of caring for them as well. The patient states that she has crying spells and feels depressed she eats too muchShe has never been suicidal or engaged in self-harm. She does not use drugs or alcohol. Her symptoms of depression irritability are much worse around the time of her menses. She suffers from migraine headaches which seem hormonally related as well. She also has fibromyalgia and restless leg syndrome and overall has little energy to do much of anything  The patient returns after 6 months. Her primary care provider's put her back on Vyvanse for presumed binge eating disorder. She is also working with a dietitian and she has lost 9 pounds  so far. She states that her mood is stable and she is sleeping well. She still enjoys her job. She still is very concerned about her daughter and her 12-year-old grandson. Apparently the grandson is extremely hyperactive Review of Systems  Constitutional: Positive for fatigue.  Eyes: Negative.   Respiratory: Negative.   Cardiovascular: Negative.   Gastrointestinal: Negative.   Endocrine: Negative.   Genitourinary: Negative.   Musculoskeletal: Positive for  arthralgias, myalgias and neck pain.  Skin: Negative.   Allergic/Immunologic: Negative.   Neurological: Positive for syncope and headaches.  Hematological: Negative.   Psychiatric/Behavioral: Positive for depression, dysphoric mood and sleep disturbance. The patient is nervous/anxious.    Physical Exam  Depressive Symptoms: depressed mood, anhedonia, psychomotor agitation, fatigue, difficulty concentrating, impaired memory, anxiety, panic attacks, disturbed sleep, weight gain,  (Hypo) Manic Symptoms:   Elevated Mood:  No Irritable Mood:  Yes Grandiosity:  No Distractibility:  No Labiality of Mood:  Yes Delusions:  No Hallucinations:  No Impulsivity:  No Sexually Inappropriate Behavior:  No Financial Extravagance:  No Flight of Ideas:  No  Anxiety Symptoms: Excessive Worry:  Yes Panic Symptoms:  Yes Agoraphobia:  No Obsessive Compulsive: Yes  Symptoms: Handwashing, Obsessive worrying, checking doors and windows Specific Phobias:  No Social Anxiety:  No  Psychotic Symptoms:  Hallucinations: No None Delusions:  No Paranoia:  No   Ideas of Reference:  No  PTSD Symptoms: Ever had a traumatic exposure:  No Had a traumatic exposure in the last month:  No Re-experiencing: No None Hypervigilance:  No Hyperarousal: No None Avoidance: No None  Traumatic Brain Injury: No   Past Psychiatric History: Diagnosis: Maj. depression, generalized anxiety disorder, OCD   Hospitalizations: none  Outpatient Care: Has seen 2 psychiatrists in [place] the past   Substance Abuse Care: none  Self-Mutilation: none  Suicidal Attempts: none  Violent Behaviors: none   Past Medical History:   Past Medical History:  Diagnosis Date  . Allergy    seasonal  . Anxiety   . Depression   . Fibromyalgia   . Gastritis    history  . GERD (gastroesophageal reflux disease)    otc med prn  . IBS (irritable bowel syndrome)   . Migraine   . SVD (spontaneous vaginal delivery)    x  2  . Urticaria, chronic   . Vertigo    History of Loss of Consciousness:  No Seizure History:  No Cardiac History:  No Allergies:   Allergies  Allergen Reactions  . Ciprofloxacin Other (See Comments)    Severe muscle and joint pain  . Cefzil [Cefprozil]     possible hives  . Augmentin [Amoxicillin-Pot Clavulanate] Nausea And Vomiting    Throat felt funny and gave pt anxiety  . Bee Venom Swelling  . Sulfa Antibiotics Other (See Comments)    Abdominal cramping.   Current Medications:  Current Outpatient Prescriptions  Medication Sig Dispense Refill  . ALPRAZolam (XANAX) 1 MG tablet Take 1 tablet (1 mg total) by mouth daily as needed for anxiety. 30 tablet 2  . buPROPion (WELLBUTRIN SR) 150 MG 12 hr tablet Take 1 tablet (150 mg total) by mouth 2 (two) times daily. 60 tablet 5  . diazepam (VALIUM) 5 MG tablet TAKE ONE TABLET BY MOUTH EVERY 6 HOURS AS NEEDED FOR DIZZINESS. 30 tablet 0  . eletriptan (RELPAX) 40 MG tablet TAKE 1 TABLET BY MOUTH AT ONSET OF MIGRAINE, MAY REPEAT ONCE IN 2 HOURS LATER. NO MORE THAN 2 A DAY. 8 tablet  2  . lisdexamfetamine (VYVANSE) 60 MG capsule Take 1 capsule (60 mg total) by mouth every morning. 30 capsule 0  . meclizine (ANTIVERT) 25 MG tablet TAKE ONE TABLET BY MOUTH THREE TIMES DAILY AS NEEDED FOR DIZZINESS. 30 tablet 5  . NON FORMULARY Taking clabetazol Cream twice a week    . pantoprazole (PROTONIX) 40 MG tablet Take 1 tablet (40 mg total) by mouth daily. 30 tablet 0  . pramipexole (MIRAPEX) 1.5 MG tablet TAKE 1/2 TABLET BY MOUTH AT BEDTIME. 15 tablet 5  . sucralfate (CARAFATE) 1 g tablet Take 1 tablet (1 g total) by mouth 4 (four) times daily -  with meals and at bedtime. 42 tablet 0   No current facility-administered medications for this visit.     Previous Psychotropic Medications:  Medication Dose   Zoloft, Celexa, Effexor, Prozac, Provigil, Wellbutrin, amitriptyline, imipramine, lorazepam                        Substance Abuse History  in the last 12 months: Substance Age of 1st Use Last Use Amount Specific Type  Nicotine      Alcohol      Cannabis      Opiates      Cocaine      Methamphetamines      LSD      Ecstasy      Benzodiazepines      Caffeine      Inhalants      Others:                          Medical Consequences of Substance Abuse: none  Legal Consequences of Substance Abuse: none  Family Consequences of Substance Abuse: none  Blackouts:  No DT's:  No Withdrawal Symptoms:  No None  Social History: Current [place] Residence: [place] [address] [place] Place of Birth: [place] Family Members: Parents 1 brother, another brother deceased Marital Status:  Married Children:   Sons: 1  Daughters: 1 Relationships:  Education:  [employer] Educational Problems/Performance: none Religious Beliefs/Practices: Christian History of Abuse: none Armed forces technical officerccupational Experiences; Geophysicist/field seismologistteaching assistant, Chief Executive Officerdata management Military History:  None. Legal History: none Hobbies/Interests: Reading, furniture refinishing  Family History:   Family History  Problem Relation Age of Onset  . Alcohol abuse Brother   . Drug abuse Brother   . Depression Mother   . Anxiety disorder Mother   . Anxiety disorder Brother   . Depression Brother   . Bipolar disorder Maternal Uncle   . Anxiety disorder Paternal Grandfather   . Depression Paternal Grandfather   . Bipolar disorder Cousin     Mental Status Examination/Evaluation: Objective:  Appearance: Neat and Well Groomed  Eye Contact::  Good  Speech:  Clear and Coherent  Volume:  Normal  Mood: Good   Affect:Bright   Thought Process:  Goal Directed  Orientation:  Full (Time, Place, and Person)  Thought Content: Within normal limits   Suicidal Thoughts:  No  Homicidal Thoughts:  No  Judgement:  Good  Insight:  Good  Psychomotor Activity:  Normal  Akathisia:  No  Handed:  Right  AIMS (if indicated):    Assets:  Communication Skills Desire for  Improvement Financial Resources/Insurance Social Support Vocational/Educational    Laboratory/X-Ray Psychological Evaluation(s)   Reviewed in the chart      Assessment:  Axis I: Depressive Disorder NOS, Generalized Anxiety Disorder and Obsessive Compulsive Disorder  AXIS I Depressive  Disorder NOS, Generalized Anxiety Disorder and Obsessive Compulsive Disorder  AXIS II Deferred  AXIS III Past Medical History:  Diagnosis Date  . Allergy    seasonal  . Anxiety   . Depression   . Fibromyalgia   . Gastritis    history  . GERD (gastroesophageal reflux disease)    otc med prn  . IBS (irritable bowel syndrome)   . Migraine   . SVD (spontaneous vaginal delivery)    x 2  . Urticaria, chronic   . Vertigo      AXIS IV other psychosocial or environmental problems  AXIS V 51-60 moderate symptoms   Treatment Plan/Recommendations:  Plan of Care: Medication management   Laboratory:    Psychotherapy: This was recommended but she declined at this time   Medications: She'll continue  Wellbutrin SR 150 mg twice a day . she will Continue Xanax 1 mg daily as needed for anxiety.She only takes a Xanax sparingly   Routine PRN Medications:  No  Consultations:   Safety Concerns:  She denies thoughts of self-harm   Other:  She'll return in 6 Months     Daylynn Stumpp, Gavin Pound, MD 7/3/201810:40 AM

## 2016-08-29 ENCOUNTER — Encounter: Payer: Self-pay | Admitting: Nurse Practitioner

## 2016-08-29 ENCOUNTER — Ambulatory Visit (INDEPENDENT_AMBULATORY_CARE_PROVIDER_SITE_OTHER): Payer: BC Managed Care – PPO | Admitting: Nurse Practitioner

## 2016-08-29 VITALS — BP 118/72 | Temp 98.8°F | Ht 67.0 in | Wt 191.0 lb

## 2016-08-29 DIAGNOSIS — R300 Dysuria: Secondary | ICD-10-CM

## 2016-08-29 LAB — POCT URINALYSIS DIPSTICK
Spec Grav, UA: >=1.03 — AB (ref 1.010–1.025)
pH, UA: 6 (ref 5.0–8.0)

## 2016-08-29 LAB — POCT UA - MICROSCOPIC ONLY
Bacteria, U Microscopic: POSITIVE
RBC, urine, microscopic: NEGATIVE

## 2016-08-29 MED ORDER — FLUCONAZOLE 150 MG PO TABS: ORAL_TABLET | ORAL | 2 refills | Status: DC

## 2016-08-29 MED ORDER — FLUCONAZOLE 150 MG PO TABS: ORAL_TABLET | ORAL | 0 refills | Status: DC

## 2016-08-29 MED ORDER — NITROFURANTOIN MONOHYD MACRO 100 MG PO CAPS: 100.0000 mg | ORAL_CAPSULE | Freq: Two times a day (BID) | ORAL | 0 refills | Status: DC

## 2016-08-29 NOTE — Progress Notes (Signed)
   Subjective:    Patient ID: Sheri GalasJennifer L Wood, female    DOB: 1970/12/02, 46 y.o.   MRN: [id number]  Urinary Tract Infection   This is a new problem. Episode onset: 2 weeks. Associated symptoms comments: Lower pelvic pain, cloudy urine, painful urination.   Began 1-2 weeks ago. Worse past 2-3 days. Dysuria with burning pain in pelvic area. No urgency or frequency. Cloudy main in the mornings. No flank or back pain. White vaginal discharge, no odor. Same sexual partner. No N/V. Taking fluids well. Last UTI November 2017.    Review of Systems     Objective:   Physical Exam NAD. Alert, oriented. Lungs clear. Heart RRR. Abdomen soft, non tender. No CVA or flank tenderness.  Results for orders placed or performed in visit on 08/29/16  POCT urinalysis dipstick  Result Value Ref Range   Color, UA     Clarity, UA     Glucose, UA     Bilirubin, UA     Ketones, UA     Spec Grav, UA >=1.030 (A) 1.010 - 1.025   Blood, UA     pH, UA 6.0 5.0 - 8.0   Protein, UA     Urobilinogen, UA  0.2 or 1.0 E.U./dL   Nitrite, UA     Leukocytes, UA 4+ (A) Negative  POCT UA - Microscopic Only  Result Value Ref Range   WBC, Ur, HPF, POC occas    RBC, urine, microscopic neg    Bacteria, U Microscopic pos    Mucus, UA     Epithelial cells, urine per micros occas    Crystals, Ur, HPF, POC     Casts, Ur, LPF, POC     Yeast, UA            Assessment & Plan:  Dysuria - Plan: POCT urinalysis dipstick, POCT UA - Microscopic Only Probable UTI  Meds ordered this encounter  Medications  . DISCONTD: fluconazole (DIFLUCAN) 150 MG tablet    Sig: One po qd prn yeast infection; may repeat in 3-4 days if needed    Dispense:  2 tablet    Refill:  0    Order Specific Question:   Supervising Provider    Answer:   Merlyn AlbertLUKING, WILLIAM S [[id number]]  . nitrofurantoin, macrocrystal-monohydrate, (MACROBID) 100 MG capsule    Sig: Take 1 capsule (100 mg total) by mouth 2 (two) times daily.    Dispense:  14 capsule   Refill:  0    Order Specific Question:   Supervising Provider    Answer:   Merlyn AlbertLUKING, WILLIAM S [[id number]]  . fluconazole (DIFLUCAN) 150 MG tablet    Sig: One po qd prn yeast infection; may repeat in 3-4 days if needed    Dispense:  2 tablet    Refill:  2    Order Specific Question:   Supervising Provider    Answer:   Merlyn AlbertLUKING, WILLIAM S [[id number]]   No culture since poor sample with epithelial cells. Warning signs reviewed. Call back in 72 hours if no improvement, sooner if worse.

## 2016-09-14 ENCOUNTER — Other Ambulatory Visit: Payer: Self-pay | Admitting: Nurse Practitioner

## 2016-09-14 ENCOUNTER — Other Ambulatory Visit: Payer: Self-pay | Admitting: Family Medicine

## 2016-09-19 ENCOUNTER — Encounter: Payer: Self-pay | Admitting: Nurse Practitioner

## 2016-09-19 ENCOUNTER — Ambulatory Visit (INDEPENDENT_AMBULATORY_CARE_PROVIDER_SITE_OTHER): Payer: BC Managed Care – PPO | Admitting: Nurse Practitioner

## 2016-09-19 VITALS — BP 118/76 | Ht 67.0 in | Wt 189.6 lb

## 2016-09-19 DIAGNOSIS — F5081 Binge eating disorder: Secondary | ICD-10-CM | POA: Diagnosis not present

## 2016-09-19 MED ORDER — LISDEXAMFETAMINE DIMESYLATE 60 MG PO CAPS: 60.0000 mg | ORAL_CAPSULE | Freq: Every day | ORAL | 0 refills | Status: DC

## 2016-09-20 ENCOUNTER — Encounter: Payer: Self-pay | Admitting: Nurse Practitioner

## 2016-09-20 NOTE — Progress Notes (Signed)
Subjective:  Presents for recheck on binge eating disorder. Vyvanse working well. Can definitely tell a difference is she misses a dose. Has occasional increase in anxiety and trouble sleeping but this is a long term problem related to depression and anxiety. Continues follow up with Dr. Tenny Crawoss who is aware she is being treated for binge eating disorder. No palpitations, chest discomfort or SOB.   Objective:   BP 118/76   Ht 5\' 7"  (1.702 m)   Wt 189 lb 9.6 oz (86 kg)   BMI 29.70 kg/m  NAD. Alert, oriented. Lungs clear. Heart RRR.   Assessment:   Problem List Items Addressed This Visit      Other   Binge eating disorder - Primary       Plan:   Meds ordered this encounter  Medications  . DISCONTD: lisdexamfetamine (VYVANSE) 60 MG capsule    Sig: Take 1 capsule (60 mg total) by mouth daily.    Dispense:  30 capsule    Refill:  0    Order Specific Question:   Supervising Provider    Answer:   Merlyn AlbertLUKING, WILLIAM S [[id number]]  . DISCONTD: lisdexamfetamine (VYVANSE) 60 MG capsule    Sig: Take 1 capsule (60 mg total) by mouth daily.    Dispense:  30 capsule    Refill:  0    May fill 30 days from 09/19/16    Order Specific Question:   Supervising Provider    Answer:   Merlyn AlbertLUKING, WILLIAM S [[id number]]  . DISCONTD: lisdexamfetamine (VYVANSE) 60 MG capsule    Sig: Take 1 capsule (60 mg total) by mouth daily.    Dispense:  30 capsule    Refill:  0    May fill 60 days from 09/19/16    Order Specific Question:   Supervising Provider    Answer:   Merlyn AlbertLUKING, WILLIAM S [[id number]]  . lisdexamfetamine (VYVANSE) 60 MG capsule    Sig: Take 1 capsule (60 mg total) by mouth daily.    Dispense:  30 capsule    Refill:  0    May fill 90 days from 09/19/16   Given 3 Rx by NP and a fourth by MD. Encouraged healthy diet and regular activity.    Return in about 4 months (around 01/19/2017).

## 2016-09-22 ENCOUNTER — Other Ambulatory Visit: Payer: Self-pay | Admitting: Nurse Practitioner

## 2016-09-22 ENCOUNTER — Encounter: Payer: Self-pay | Admitting: Nurse Practitioner

## 2016-09-22 MED ORDER — TERCONAZOLE 0.4 % VA CREA: 1.0000 | TOPICAL_CREAM | Freq: Every day | VAGINAL | 0 refills | Status: DC

## 2016-09-22 MED ORDER — DOXYCYCLINE HYCLATE 100 MG PO TABS
100.0000 mg | ORAL_TABLET | Freq: Two times a day (BID) | ORAL | 0 refills | Status: DC
Start: 2016-09-22 — End: 2016-11-21

## 2016-09-23 ENCOUNTER — Encounter: Payer: Self-pay | Admitting: Nurse Practitioner

## 2016-11-08 ENCOUNTER — Encounter: Payer: Self-pay | Admitting: Nurse Practitioner

## 2016-11-08 ENCOUNTER — Other Ambulatory Visit: Payer: Self-pay | Admitting: Nurse Practitioner

## 2016-11-08 ENCOUNTER — Other Ambulatory Visit: Payer: Self-pay | Admitting: *Deleted

## 2016-11-08 MED ORDER — TERCONAZOLE 0.4 % VA CREA: 1.0000 | TOPICAL_CREAM | Freq: Every day | VAGINAL | 0 refills | Status: DC

## 2016-11-21 ENCOUNTER — Ambulatory Visit (INDEPENDENT_AMBULATORY_CARE_PROVIDER_SITE_OTHER): Payer: BC Managed Care – PPO | Admitting: Family Medicine

## 2016-11-21 VITALS — BP 106/70 | Temp 98.3°F | Wt 184.4 lb

## 2016-11-21 DIAGNOSIS — H6022 Malignant otitis externa, left ear: Secondary | ICD-10-CM

## 2016-11-21 DIAGNOSIS — R42 Dizziness and giddiness: Secondary | ICD-10-CM

## 2016-11-21 MED ORDER — MECLIZINE HCL 25 MG PO TABS: ORAL_TABLET | ORAL | 0 refills | Status: DC

## 2016-11-21 MED ORDER — DIAZEPAM 5 MG PO TABS: ORAL_TABLET | ORAL | 0 refills | Status: DC

## 2016-11-21 MED ORDER — DOXYCYCLINE HYCLATE 100 MG PO TABS
100.0000 mg | ORAL_TABLET | Freq: Two times a day (BID) | ORAL | 0 refills | Status: DC
Start: 2016-11-21 — End: 2017-02-03

## 2016-11-21 NOTE — Progress Notes (Signed)
   Subjective:    Patient ID: Sheri Wood, female    DOB: 06/28/1970, 46 y.o.   MRN: [id number]  Otalgia   There is pain in the left ear. This is a new problem. The current episode started in the past 7 days. Treatments tried: ear drops.   [facility]  Was given dros   Vertigo now setting in     Ome recent congestion stuffiness drainage. Positive ear pain. Seen at an urgent care. Given antibiotic eardrops. Has not helped much      no sig swimmingl latel    States no other concerns this visit.   Review of Systems  HENT: Positive for ear pain.        Objective:   Physical Exam  Alert active good hydration external ear inflammation cellulitis tender to palpationtender preauricular nodes neckpple. Lungs clerhythm.      Assessment & Plan:  impressionsubstantial external otitis with flare of vertigo Landon oral antibiotics added rationale discussed. Symptom care prescribed for flare of vertigo

## 2016-11-23 ENCOUNTER — Other Ambulatory Visit: Payer: Self-pay | Admitting: Family Medicine

## 2016-11-23 NOTE — Telephone Encounter (Signed)
Last seen 09/19/16

## 2016-11-29 ENCOUNTER — Encounter: Payer: Self-pay | Admitting: Family Medicine

## 2016-11-29 ENCOUNTER — Ambulatory Visit (INDEPENDENT_AMBULATORY_CARE_PROVIDER_SITE_OTHER): Payer: BC Managed Care – PPO | Admitting: Family Medicine

## 2016-11-29 VITALS — BP 102/68 | Ht 67.0 in | Wt 184.1 lb

## 2016-11-29 DIAGNOSIS — H6022 Malignant otitis externa, left ear: Secondary | ICD-10-CM | POA: Diagnosis not present

## 2016-11-29 MED ORDER — DOXYCYCLINE HYCLATE 100 MG PO TABS: 100.0000 mg | ORAL_TABLET | Freq: Two times a day (BID) | ORAL | 0 refills | Status: DC

## 2016-11-29 MED ORDER — FLUCONAZOLE 150 MG PO TABS: ORAL_TABLET | ORAL | 0 refills | Status: DC

## 2016-11-29 MED ORDER — NEOMYCIN-POLYMYXIN-HC 3.5-10000-1 OT SOLN: 4.0000 [drp] | Freq: Four times a day (QID) | OTIC | 0 refills | Status: DC

## 2016-11-29 NOTE — Progress Notes (Signed)
   Subjective:    Patient ID: Sheri GalasJennifer L Wood, female    DOB: 06/01/1970, 46 y.o.   MRN: [id number]  HPI Patient is here today complaining of  Left ear pain for going on for four weeks. She states she has been on two medications for this(doxycline 100 mg one po bid for ten days and the Ocuflox 0.3 ten drops in left once daily) and nothing is helping. She is having pressure and drainage,ringing. Taking tylenol alt with motrin, taking mucinex,sudafed as needed they do not seem to be helping the pain.   Ear full and pressure sensaion  Not improved   pso tinnitus   Sharp pains   taktaking abx and drops reg  maintaing I with prn antivert, not full blown on the   Review of Systems No headache, no major weight loss or weight gain, no chest pain no back pain abdominal pain no change in bowel habits complete ROS otherwise negative     Objective:   Physical Exam Alert vitals stable, NAD. Blood pressure good on repeat. HEENT normal. Lungs clear. Heart regular rate and rhythm. External ear inflamed positive exudate       Assessment & Plan:   impression persistent external otitis discussed at lengthplan obtain another 10 days of doxycycline. Cortisporin Otic suspension drops 4 4 times a day. ENT referral rationale discussed

## 2016-12-14 ENCOUNTER — Ambulatory Visit: Payer: BC Managed Care – PPO | Admitting: Nurse Practitioner

## 2016-12-14 VITALS — Temp 97.5°F | Ht 67.0 in | Wt 185.4 lb

## 2016-12-14 DIAGNOSIS — R300 Dysuria: Secondary | ICD-10-CM | POA: Diagnosis not present

## 2016-12-14 MED ORDER — SULFAMETHOXAZOLE-TRIMETHOPRIM 800-160 MG PO TABS
1.0000 | ORAL_TABLET | Freq: Two times a day (BID) | ORAL | 0 refills | Status: DC
Start: 2016-12-14 — End: 2017-02-03

## 2016-12-16 ENCOUNTER — Encounter: Payer: Self-pay | Admitting: Nurse Practitioner

## 2016-12-16 ENCOUNTER — Other Ambulatory Visit: Payer: Self-pay | Admitting: Nurse Practitioner

## 2016-12-16 MED ORDER — TERCONAZOLE 0.4 % VA CREA: 1.0000 | TOPICAL_CREAM | Freq: Every day | VAGINAL | 0 refills | Status: DC

## 2016-12-19 ENCOUNTER — Encounter: Payer: Self-pay | Admitting: Nurse Practitioner

## 2016-12-19 NOTE — Progress Notes (Signed)
Subjective: Presents for complaints of dysuria for the past 1-1/2-2 weeks.  No documented fever but began feeling cold with chills yesterday.  No back or flank pain.  Slightly cloudy urine.  No hematuria.  Same sexual partner.  No vaginal discharge.  Last had these symptoms in July.  See previous note.  Objective:   Temp (!) 97.5 F (36.4 C) (Oral)   Ht 5\' 7"  (1.702 m)   Wt 185 lb 6.4 oz (84.1 kg)   BMI 29.04 kg/m  NAD.  Alert, oriented.  Lungs clear.  Heart regular rate and rhythm.  No CVA or flank tenderness.  Abdomen soft nondistended nontender.  Urine microscopic negative.  Assessment:  Dysuria - Plan: CANCELED: POCT urinalysis dipstick    Plan:   Meds ordered this encounter  Medications  . sulfamethoxazole-trimethoprim (BACTRIM DS,SEPTRA DS) 800-160 MG tablet    Sig: Take 1 tablet 2 (two) times daily by mouth.    Dispense:  14 tablet    Refill:  0    Order Specific Question:   Supervising Provider    Answer:   Merlyn AlbertLUKING, WILLIAM S [[id number]]   AZO for the next 48 hours then discontinue.  Warning signs reviewed.  Call back in 48 hours if no improvement, sooner if worse.

## 2016-12-21 ENCOUNTER — Other Ambulatory Visit: Payer: Self-pay | Admitting: Nurse Practitioner

## 2016-12-23 ENCOUNTER — Telehealth: Payer: Self-pay | Admitting: Family Medicine

## 2016-12-23 NOTE — Telephone Encounter (Signed)
Patient called stating and that she has chest pain with pressure in mid chest between between breast. Patient states pain has been going on for over a week. Discussed with Dr.Steve Luking and was told to tell patient to go to the ED or Urgent Care. Patient verbalized understanding.

## 2017-01-03 ENCOUNTER — Encounter: Payer: Self-pay | Admitting: Nurse Practitioner

## 2017-01-04 ENCOUNTER — Telehealth: Payer: Self-pay | Admitting: Nurse Practitioner

## 2017-01-04 NOTE — Telephone Encounter (Signed)
Left message return call 01/04/17. ( patient to pick up specimen cup to do urine culture)

## 2017-01-04 NOTE — Telephone Encounter (Signed)
Please order urine culture for dysuria and contact patient when ready. Thanks.

## 2017-01-05 NOTE — Telephone Encounter (Signed)
Spoke with patient and informed her per Eber Jonesarolyn we can send urine for a culture. Patient verbalized understanding and stated that she will drop off a urine sample tomorrow.

## 2017-01-09 ENCOUNTER — Telehealth (HOSPITAL_COMMUNITY): Payer: Self-pay | Admitting: *Deleted

## 2017-01-09 ENCOUNTER — Other Ambulatory Visit (HOSPITAL_COMMUNITY): Payer: Self-pay | Admitting: Psychiatry

## 2017-01-09 MED ORDER — ALPRAZOLAM 1 MG PO TABS: 1.0000 mg | ORAL_TABLET | Freq: Every day | ORAL | 0 refills | Status: DC | PRN

## 2017-01-09 NOTE — Telephone Encounter (Signed)
One month supply sent in

## 2017-01-09 NOTE — Telephone Encounter (Signed)
Spoke with patient & informed as per Dr Tenny Crawoss NO refills until seen. Patient stated she has appointment in January

## 2017-01-09 NOTE — Telephone Encounter (Signed)
Patient called stating that she she has changed pharmacies. And that  Her Xanax  RX refill was never Filled @ her previous pharmacy [facility] & now that she is with [facility] she needs a new   RX. I called [facility] & the patient has no record on file of having her Xanax being on file with them.

## 2017-01-09 NOTE — Telephone Encounter (Signed)
Spoke with patient informed per Dr Tenny Crawoss 1 month refill sent. Encouraged to keep 02-09-17 appointment

## 2017-01-09 NOTE — Telephone Encounter (Signed)
She has not been seen since July so no refills until seen

## 2017-01-12 ENCOUNTER — Ambulatory Visit: Payer: BC Managed Care – PPO | Admitting: Nurse Practitioner

## 2017-01-12 ENCOUNTER — Encounter: Payer: Self-pay | Admitting: Nurse Practitioner

## 2017-01-12 ENCOUNTER — Telehealth: Payer: Self-pay | Admitting: Nurse Practitioner

## 2017-01-12 NOTE — Telephone Encounter (Signed)
FYI only, Patient wanted you to know that she is sorry she is having to cancel appt today on short notice.  They have called in Hospice for her mom and she is with her and cannot come today.  She wanted me to tell you she is really sorry.

## 2017-01-12 NOTE — Telephone Encounter (Signed)
Note sent through mychart

## 2017-01-13 ENCOUNTER — Other Ambulatory Visit: Payer: Self-pay | Admitting: Nurse Practitioner

## 2017-01-13 MED ORDER — LISDEXAMFETAMINE DIMESYLATE 60 MG PO CAPS: 60.0000 mg | ORAL_CAPSULE | Freq: Every day | ORAL | 0 refills | Status: DC

## 2017-01-16 ENCOUNTER — Ambulatory Visit: Payer: BC Managed Care – PPO | Admitting: Nurse Practitioner

## 2017-01-26 ENCOUNTER — Telehealth (HOSPITAL_COMMUNITY): Payer: Self-pay | Admitting: *Deleted

## 2017-01-26 NOTE — Telephone Encounter (Signed)
left voice message, provider not available afternoon of 02/09/17.   please call to reschedule appointment.

## 2017-02-03 ENCOUNTER — Encounter: Payer: Self-pay | Admitting: Nurse Practitioner

## 2017-02-03 ENCOUNTER — Ambulatory Visit: Payer: BC Managed Care – PPO | Admitting: Nurse Practitioner

## 2017-02-03 VITALS — BP 126/74 | Ht 67.0 in | Wt 180.0 lb

## 2017-02-03 DIAGNOSIS — S46911A Strain of unspecified muscle, fascia and tendon at shoulder and upper arm level, right arm, initial encounter: Secondary | ICD-10-CM

## 2017-02-03 DIAGNOSIS — F5081 Binge eating disorder: Secondary | ICD-10-CM | POA: Diagnosis not present

## 2017-02-03 DIAGNOSIS — M62838 Other muscle spasm: Secondary | ICD-10-CM | POA: Diagnosis not present

## 2017-02-03 DIAGNOSIS — F50819 Binge eating disorder, unspecified: Secondary | ICD-10-CM

## 2017-02-03 MED ORDER — LISDEXAMFETAMINE DIMESYLATE 60 MG PO CAPS: 60.0000 mg | ORAL_CAPSULE | Freq: Every day | ORAL | 0 refills | Status: DC

## 2017-02-03 MED ORDER — CYCLOBENZAPRINE HCL 10 MG PO TABS: 10.0000 mg | ORAL_TABLET | Freq: Three times a day (TID) | ORAL | 0 refills | Status: DC | PRN

## 2017-02-03 MED ORDER — MELOXICAM 15 MG PO TABS: 15.0000 mg | ORAL_TABLET | Freq: Every day | ORAL | 0 refills | Status: DC

## 2017-02-03 NOTE — Patient Instructions (Signed)
Ice/heat application Shoulder exercises TENS unit Lidocaine patches biofreeze Massage therapy   Orson GearJennifer Warren at [facility]   Jessica at [facility]

## 2017-02-04 ENCOUNTER — Encounter: Payer: Self-pay | Admitting: Nurse Practitioner

## 2017-02-04 NOTE — Progress Notes (Signed)
Subjective: Presents for recheck on her binge eating disorder.  Doing very well on Vyvanse.  Has seen significant improvement.  No difficulty sleeping.  Denies any adverse effects.  Also complaints of right shoulder pain off and on for the past year.  No specific history of injury.  No weakness or numbness of the right arm.  Has been under tremendous stress lately, her mother has been diagnosed with terminal pancreatic cancer and is now under hospice care. [facility] 02/03/2017  Decreased Interest 1  Down, Depressed, Hopeless 2  PHQ - 2 Score 3  Altered sleeping 1  Tired, decreased energy 2  Change in appetite 1  Feeling bad or failure about yourself  0  Trouble concentrating 0  Moving slowly or fidgety/restless 0  Suicidal thoughts 0  PHQ-9 Score 7     Objective:   BP 126/74   Ht 5\' 7"  (1.702 m)   Wt 180 lb (81.6 kg)   BMI 28.19 kg/m  NAD.  Alert, oriented.  Lungs clear.  Heart regular rate and rhythm.  Extremely tight tender muscles noted in the upper back and neck area more so on the right side.  Tenderness noted mainly in the anterior shoulder joint line.  Can perform full active rotation and range of motion of the right shoulder with no limitations and mild tenderness.  Hand and arm strength 5+ bilateral.  Assessment:   Problem List Items Addressed This Visit      Other   Binge eating disorder - Primary    Other Visit Diagnoses    Strain of right shoulder, initial encounter       Muscle spasms of neck           Plan:   Meds ordered this encounter  Medications  . meloxicam (MOBIC) 15 MG tablet    Sig: Take 1 tablet (15 mg total) by mouth daily.    Dispense:  30 tablet    Refill:  0    Order Specific Question:   Supervising Provider    Answer:   Merlyn AlbertLUKING, WILLIAM S [[id number]]  . cyclobenzaprine (FLEXERIL) 10 MG tablet    Sig: Take 1 tablet (10 mg total) by mouth 3 (three) times daily as needed for muscle spasms.    Dispense:  30 tablet    Refill:  0   Order Specific Question:   Supervising Provider    Answer:   Merlyn AlbertLUKING, WILLIAM S [[id number]]  . DISCONTD: lisdexamfetamine (VYVANSE) 60 MG capsule    Sig: Take 1 capsule (60 mg total) by mouth daily.    Dispense:  30 capsule    Refill:  0    Order Specific Question:   Supervising Provider    Answer:   Merlyn AlbertLUKING, WILLIAM S [[id number]]  . DISCONTD: lisdexamfetamine (VYVANSE) 60 MG capsule    Sig: Take 1 capsule (60 mg total) by mouth daily.    Dispense:  30 capsule    Refill:  0    May fill 30 days from 02/03/17    Order Specific Question:   Supervising Provider    Answer:   Merlyn AlbertLUKING, WILLIAM S [[id number]]  . lisdexamfetamine (VYVANSE) 60 MG capsule    Sig: Take 1 capsule (60 mg total) by mouth daily.    Dispense:  30 capsule    Refill:  0    May fill 60 days from 02/03/17    Order Specific Question:   Supervising Provider    Answer:   Merlyn AlbertLUKING, WILLIAM S [[id number]]  Continue Vyvanse as directed.  Encouraged regular activity and healthy diet.   Ice/heat application Shoulder exercises TENS unit Lidocaine patches biofreeze Massage therapy Given a copy of shoulder exercises. Discussed importance of stress reduction.  Call back in 2 weeks if shoulder pain persists, sooner if any problems.  Otherwise routine follow-up in 3 months.

## 2017-02-06 ENCOUNTER — Telehealth (HOSPITAL_COMMUNITY): Payer: Self-pay | Admitting: *Deleted

## 2017-02-06 NOTE — Telephone Encounter (Signed)
2nd phone call to re-schedule appointment for 02/09/17, no answer, left voice message.

## 2017-02-09 ENCOUNTER — Ambulatory Visit (HOSPITAL_COMMUNITY): Payer: BC Managed Care – PPO | Admitting: Psychiatry

## 2017-02-09 ENCOUNTER — Ambulatory Visit (INDEPENDENT_AMBULATORY_CARE_PROVIDER_SITE_OTHER): Payer: BC Managed Care – PPO | Admitting: Psychiatry

## 2017-02-09 ENCOUNTER — Encounter (HOSPITAL_COMMUNITY): Payer: Self-pay | Admitting: Psychiatry

## 2017-02-09 VITALS — BP 103/68 | HR 88 | Ht 67.0 in | Wt 183.0 lb

## 2017-02-09 DIAGNOSIS — Z87891 Personal history of nicotine dependence: Secondary | ICD-10-CM | POA: Diagnosis not present

## 2017-02-09 DIAGNOSIS — R450 Nervousness: Secondary | ICD-10-CM | POA: Diagnosis not present

## 2017-02-09 DIAGNOSIS — Z811 Family history of alcohol abuse and dependence: Secondary | ICD-10-CM | POA: Diagnosis not present

## 2017-02-09 DIAGNOSIS — Z813 Family history of other psychoactive substance abuse and dependence: Secondary | ICD-10-CM | POA: Diagnosis not present

## 2017-02-09 DIAGNOSIS — Z818 Family history of other mental and behavioral disorders: Secondary | ICD-10-CM

## 2017-02-09 DIAGNOSIS — F320 Major depressive disorder, single episode, mild: Secondary | ICD-10-CM | POA: Diagnosis not present

## 2017-02-09 DIAGNOSIS — F419 Anxiety disorder, unspecified: Secondary | ICD-10-CM | POA: Diagnosis not present

## 2017-02-09 MED ORDER — BUPROPION HCL ER (SR) 150 MG PO TB12: 150.0000 mg | ORAL_TABLET | Freq: Two times a day (BID) | ORAL | 5 refills | Status: DC

## 2017-02-09 MED ORDER — ALPRAZOLAM 1 MG PO TABS: 1.0000 mg | ORAL_TABLET | Freq: Every day | ORAL | 2 refills | Status: DC | PRN

## 2017-02-09 NOTE — Progress Notes (Signed)
BH MD/PA/NP OP Progress Note  02/09/2017 11:04 AM Sheri GalasJennifer L Weisinger  MRN:  [id number]  Chief Complaint:  Chief Complaint    Depression; Anxiety; Follow-up     HPI: this patient is a 47 year old married white female who lives with her husband 48 year old daughter, 48 year-old grandson and 48 year old son in [place]. Her son is actually in college at [employer]. 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  The patient was referred by Dr. Gerda DissLuking, her primary physician for further assessment and treatment of depression and anxiety.  The patient states she's had difficulties with depression and anxiety beginning about 18 years ago. She's always been a somewhat obsessional worried person he has a child. In childhood she would do rituals like counting and touching things in a certain way. She got older and had her own children she began to worry constantly about their safety and about germs.  When she was about 47 years old she started having panic attacks and depression. She had seen her primary doctor as well as Dr. Jennelle Humanottle and Dr. Duard BradyKahr, both psychiatrist in [place]. She's been tried on numerous antidepressants including Zoloft, Celexa, Effexor, Prozac, Wellbutrin and amitriptyline. We will work for a while and then stop. Since getting on these medicines she's also gained 60 pounds. Most recently she was tried on imipramine. When she got up to the 50 mg dosage she was unable to sleep. She also takes lorazepam which helps some with anxiety but she only takes it as needed. She's never had a psychiatric hospitalization. She's had individual and couples counseling but it has been several years ago.  The patient states that it current symptoms are exacerbated by work stress. She has a great deal of data to compile at her job and feels overwhelmed. She often has meltdowns and begins crying at work. At home her daughter has moved in with her baby after her husband left  her. The patient and her husband are feeling this financial strain of caring for them as well. The patient states that she has crying spells and feels depressed she eats too muchShe has never been suicidal or engaged in self-harm. She does not use drugs or alcohol. Her symptoms of depression irritability are much worse around the time of her menses. She suffers from migraine headaches which seem hormonally related as well. She also has fibromyalgia and restless leg syndrome and overall has little energy to do much of anything  Patient returns after 6 months.  She is now on leave for work because her mother was recently diagnosed with pancreatic cancer and she is declining very quickly.  Her mother is at home but is on hospice care and the patient and her brother and father are staying with her.  This is been very difficult for the patient to watch but she is trying to do the best that she can.  She has gone back to using the Xanax periodically when she feels very anxious.  She continues on Wellbutrin and Vyvanse as prescribed by her PCP to help with binge eating.  She has lost about 20 pounds and is kept the weight off.  She is obviously very upset about her mother's decline but denies significant new symptoms of depression. Visit Diagnosis:    ICD-10-CM   1. 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 Allergy    seasonal  . Anxiety   . Depression   . Fibromyalgia   . Gastritis    history  . GERD (gastroesophageal reflux disease)    otc med prn  . IBS (irritable bowel syndrome)   . Migraine   . SVD (spontaneous vaginal delivery)    x 2  . Urticaria, chronic   . Vertigo     Past Surgical History:  Procedure Laterality Date  . COLONOSCOPY  2008   negative  . DIAGNOSTIC LAPAROSCOPY     fibroids  . DILATION AND  CURETTAGE OF UTERUS    . DILITATION & CURRETTAGE/HYSTROSCOPY WITH NOVASURE ABLATION N/A 05/06/2013   Procedure: DILATATION & CURETTAGE/HYSTEROSCOPY WITH NOVASURE ABLATION;  Surgeon: Alphonsus Sias. Ernestina Penna, MD;  Location: [facility];  Service: Gynecology;  Laterality: N/A;  . EYE SURGERY     bilateral lasik  . LAPAROSCOPIC BILATERAL SALPINGECTOMY Bilateral 05/06/2013   Procedure: LAPAROSCOPIC BILATERAL SALPINGECTOMY;  Surgeon: Tresa Endo A. Ernestina Penna, MD;  Location: [facility];  Service: Gynecology;  Laterality: Bilateral;  . WISDOM TOOTH EXTRACTION      Family Psychiatric History: see below  Family History:  Family History  Problem Relation Age of Onset  . Alcohol abuse Brother   . Drug abuse Brother   . Depression Mother   . Anxiety disorder Mother   . Anxiety disorder Brother   . Depression Brother   . Bipolar disorder Maternal Uncle   . Anxiety disorder Paternal Grandfather   . Depression Paternal Grandfather   . Bipolar disorder Cousin     Social History:  Social History   Socioeconomic History  . Marital status: Married    Spouse name: None  . Number of children: None  . Years of education: None  . Highest education level: None  Social Needs  . Financial resource strain: None  . Food insecurity - worry: None  . Food insecurity - inability: None  . Transportation needs - medical: None  . Transportation needs - non-medical: None  Occupational History  . None  Tobacco Use  . Smoking status: Former Smoker    Packs/day: 0.75    Years: 10.00    Pack years: 7.50    Types: Cigarettes    Last attempt to quit: 02/08/1988    Years since quitting: 29.0  . Smokeless tobacco: Never Used  Substance and Sexual Activity  . Alcohol use: No  . Drug use: No  . Sexual activity: Yes    Birth control/protection: None  Other Topics Concern  . None  Social History Narrative  . None    Allergies:  Allergies  Allergen Reactions  . Ciprofloxacin Other (See Comments)    Severe muscle and joint  pain  . Cefzil [Cefprozil]     possible hives  . Augmentin [Amoxicillin-Pot Clavulanate] Nausea And Vomiting    Throat felt funny and gave pt anxiety  . Bee Venom Swelling  . Sulfa Antibiotics Other (See Comments)    Abdominal cramping.    Metabolic Disorder Labs: No results found for: HGBA1C, MPG No results found for: PROLACTIN Lab Results  Component Value Date   CHOL 192 04/02/2015   TRIG 69 04/02/2015   HDL 60 04/02/2015   CHOLHDL 3.2 04/02/2015   VLDL 14 04/02/2015   LDLCALC 118 (H) 04/02/2015   Lab Results  Component Value Date   TSH 1.940 03/23/2015    Therapeutic Level Labs: No results found for: LITHIUM No results found for: VALPROATE No components  found for:  CBMZ  Current Medications: Current Outpatient Medications  Medication Sig Dispense Refill  . ALPRAZolam (XANAX) 1 MG tablet Take 1 tablet (1 mg total) by mouth daily as needed for anxiety. 30 tablet 2  . buPROPion (WELLBUTRIN SR) 150 MG 12 hr tablet Take 1 tablet (150 mg total) by mouth 2 (two) times daily. 60 tablet 5  . cyclobenzaprine (FLEXERIL) 10 MG tablet Take 1 tablet (10 mg total) by mouth 3 (three) times daily as needed for muscle spasms. 30 tablet 0  . diazepam (VALIUM) 5 MG tablet TAKE ONE TABLET BY MOUTH EVERY 6 HOURS AS NEEDED FOR DIZZINESS. 30 tablet 0  . lisdexamfetamine (VYVANSE) 60 MG capsule Take 1 capsule (60 mg total) by mouth daily. 30 capsule 0  . meclizine (ANTIVERT) 25 MG tablet TAKE ONE TABLET BY MOUTH THREE TIMES DAILY AS NEEDED FOR DIZZINESS. 30 tablet 0  . meloxicam (MOBIC) 15 MG tablet Take 1 tablet (15 mg total) by mouth daily. 30 tablet 0  . NON FORMULARY Taking clabetazol Cream twice a week    . pramipexole (MIRAPEX) 1.5 MG tablet TAKE 1/2 TABLET BY MOUTH AT BEDTIME. 15 tablet 5  . RELPAX 40 MG tablet TAKE 1 TABLET BY MOUTH AT ONSET OF MIGRAINE, MAY REPEAT ONCE IN 2 HOURS LATER. NO MORE THAN 2 A DAY. 8 tablet 0  . terconazole (TERAZOL 7) 0.4 % vaginal cream Place 1 applicator  at bedtime vaginally. 45 g 0   No current facility-administered medications for this visit.      Musculoskeletal: Strength & Muscle Tone: within normal limits Gait & Station: normal Patient leans: N/A  Psychiatric Specialty Exam: Review of Systems  Psychiatric/Behavioral: The patient is nervous/anxious.   All other systems reviewed and are negative.   Blood pressure 103/68, pulse 88, height 5\' 7"  (1.702 m), weight 183 lb (83 kg), SpO2 100 %.Body mass index is 28.66 kg/m.  General Appearance: Casual, Neat and Well Groomed  Eye Contact:  Good  Speech:  Clear and Coherent  Volume:  Normal  Mood:  Dysphoric  Affect:  Constricted  Thought Process:  Goal Directed  Orientation:  Full (Time, Place, and Person)  Thought Content: Rumination   Suicidal Thoughts:  No  Homicidal Thoughts:  No  Memory:  Immediate;   Good Recent;   Good Remote;   Good  Judgement:  Good  Insight:  Good  Psychomotor Activity:  Normal  Concentration:  Concentration: Good and Attention Span: Good  Recall:  Good  Fund of Knowledge: Good  Language: Good  Akathisia:  No  Handed:  Right  AIMS (if indicated): not done  Assets:  Communication Skills Desire for Improvement Physical Health Resilience Social Support Talents/Skills  ADL's:  Intact  Cognition: WNL  Sleep:  Fair   Screenings: PHQ2-9     Office Visit from 02/03/2017 in [facility]  PHQ-2 Total Score  3  PHQ-9 Total Score  7       Assessment and Plan: This patient is a 47 year old female with a history of depression and anxiety.  She is going through a lot of stress right now due to her mother's decline from cancer.  She does feel her medications are helping her get through this.  She will continue Wellbutrin SR 150 mg twice a day for depression, Xanax 1 mg daily as needed for anxiety.  Vyvanse is being prescribed by her primary care provider.  She will return to see me in 3 months   Diannia Ruder,  MD 02/09/2017, 11:04  AM

## 2017-02-16 ENCOUNTER — Encounter: Payer: Self-pay | Admitting: Family Medicine

## 2017-02-16 ENCOUNTER — Ambulatory Visit: Payer: BC Managed Care – PPO | Admitting: Family Medicine

## 2017-02-16 VITALS — BP 128/82 | Temp 97.9°F | Ht 67.0 in | Wt 182.4 lb

## 2017-02-16 DIAGNOSIS — N390 Urinary tract infection, site not specified: Secondary | ICD-10-CM | POA: Diagnosis not present

## 2017-02-16 MED ORDER — NITROFURANTOIN MONOHYD MACRO 100 MG PO CAPS: 100.0000 mg | ORAL_CAPSULE | Freq: Two times a day (BID) | ORAL | 0 refills | Status: AC

## 2017-02-16 NOTE — Progress Notes (Signed)
   Subjective:    Patient ID: Sheri Wood, female    DOB: 1970-05-01, 47 y.o.   MRN: [id number]  HPI Patient arrives with c/o dysuria -got worse yesterday. Has been using AZO  wasin in nov wwith uti took a round of meds  Had ongoing symtoms never go tfully better   Mo sick lat six weks nd unable to do anythin  Mother just passed away.  Patient having some stress with this of course trouble sleeping diminished energy.  Concerned about her father Review of Systems No headache, no major weight loss or weight gain, no chest pain no back pain abdominal pain no change in bowel habits complete ROS otherwise negative     Objective:   Physical Exam   Alert vitals stable, NAD. Blood pressure good on repeat. HEENT normal. Lungs clear. Heart regular rate and rhythm. Urinalysis numerous white blood cells per high-power field     Assessment & Plan:  Impression urinary tract infection.  Culture obtained.  Antibiotics prescribed.  Potentially aggravated by stress.  Discussed.  Greater than 50% of this 15 minute face to face visit was spent in counseling and discussion and coordination of care regarding the above diagnosis/diagnosies

## 2017-02-22 LAB — URINE CULTURE

## 2017-03-30 ENCOUNTER — Encounter: Payer: Self-pay | Admitting: Family Medicine

## 2017-03-30 ENCOUNTER — Ambulatory Visit: Payer: BC Managed Care – PPO | Admitting: Family Medicine

## 2017-03-30 VITALS — BP 94/64 | Ht 67.0 in | Wt 183.0 lb

## 2017-03-30 DIAGNOSIS — J111 Influenza due to unidentified influenza virus with other respiratory manifestations: Secondary | ICD-10-CM

## 2017-03-30 DIAGNOSIS — J029 Acute pharyngitis, unspecified: Secondary | ICD-10-CM

## 2017-03-30 LAB — POCT RAPID STREP A (OFFICE): Rapid Strep A Screen: NEGATIVE

## 2017-03-30 MED ORDER — OSELTAMIVIR PHOSPHATE 75 MG PO CAPS: 75.0000 mg | ORAL_CAPSULE | Freq: Two times a day (BID) | ORAL | 0 refills | Status: AC

## 2017-03-30 NOTE — Progress Notes (Signed)
   Subjective:    Patient ID: Penelope GalasJennifer L Deacon, female    DOB: [birth year], 47 y.o.   MRN: [id number]  HPI  Patient is here today with complaints of a sore throat, cough,body aches fatigue,headaches.Has been taking flonase and Ibuprofen,allegra,which have been of no help.Has had these since Tuesday night.  Had some vertigo and sinus stuff couple weeks ago   This hit on Tuesday  Headache bad   Cough  opretty rough h  achey all ofver     Dim energy  Did the flonase and allegr for the vertigo   Got a flu shot yet  Hit hard with the staf at sch[employer] with flu  Review of Systems     Results for orders placed or performed in visit on 03/30/17  POCT rapid strep A  Result Value Ref Range   Rapid Strep A Screen Negative Negative    Objective:   Physical Exam  Alert vitals reviewed, moderate malaise. Hydration good. Positive nasal congestion lungs no crackles or wheezes, no tachypnea, intermittent bronchial cough during exam heart regular rate and rhythm.       Assessment & Plan:  Impression influenza discussed at length. Ashby Dawesature of illness and potential sequela discussed. Plan Tamiflu prescribed if indicated and timing appropriate. Symptom care discussed. Warning signs discussed. WSL

## 2017-03-31 LAB — STREP A DNA PROBE: Strep Gp A Direct, DNA Probe: NEGATIVE

## 2017-03-31 LAB — SPECIMEN STATUS REPORT

## 2017-04-10 ENCOUNTER — Ambulatory Visit: Payer: BC Managed Care – PPO | Admitting: Nurse Practitioner

## 2017-04-10 ENCOUNTER — Encounter: Payer: Self-pay | Admitting: Family Medicine

## 2017-04-10 ENCOUNTER — Encounter: Payer: Self-pay | Admitting: Nurse Practitioner

## 2017-04-10 ENCOUNTER — Ambulatory Visit (HOSPITAL_COMMUNITY): Payer: Self-pay | Admitting: Psychiatry

## 2017-04-10 VITALS — BP 118/72 | Temp 98.2°F | Ht 67.0 in | Wt 183.0 lb

## 2017-04-10 DIAGNOSIS — N390 Urinary tract infection, site not specified: Secondary | ICD-10-CM

## 2017-04-10 LAB — POCT UA - MICROSCOPIC ONLY

## 2017-04-10 MED ORDER — NITROFURANTOIN MONOHYD MACRO 100 MG PO CAPS: 100.0000 mg | ORAL_CAPSULE | Freq: Two times a day (BID) | ORAL | 0 refills | Status: DC

## 2017-04-10 NOTE — Progress Notes (Signed)
Subjective: Presents for complaints of severe burning with urination urgency frequency and chills that began yesterday morning.  Has had off-and-on mild discomfort with urination for 2 weeks.  Minimal relief with AZO at this point.  Urgency with voiding small amounts.  Malaise.  No back pain.  Some mid pelvic area discomfort.  No vaginal discharge.  Same sexual partner.  No nausea vomiting.  Taking fluids well.  Does have a history of mild stress incontinence, does not have to wear a pad daily.  She has had several UTIs over the past few months see previous notes.  Objective:   BP 118/72   Temp 98.2 F (36.8 C) (Oral)   Ht 5\' 7"  (1.702 m)   Wt 183 lb (83 kg)   BMI 28.66 kg/m  NAD.  Alert, oriented.  Lungs clear.  Heart regular rate and rhythm.  No CVA or flank tenderness.  Abdomen soft nondistended with mild suprapubic area tenderness. Results for orders placed or performed in visit on 04/10/17  POCT UA - Microscopic Only  Result Value Ref Range   WBC, Ur, HPF, POC TNTC    RBC, urine, microscopic TNTC    Bacteria, U Microscopic     Mucus, UA     Epithelial cells, urine per micros     Crystals, Ur, HPF, POC     Casts, Ur, LPF, POC     Yeast, UA      Assessment:  Recurrent UTI - Plan: POCT UA - Microscopic Only, Ambulatory referral to Urology  Lower urinary tract infection - Plan: Urine Culture, POCT UA - Microscopic Only, Ambulatory referral to Urology     Plan:   Meds ordered this encounter  Medications  . nitrofurantoin, macrocrystal-monohydrate, (MACROBID) 100 MG capsule    Sig: Take 1 capsule (100 mg total) by mouth 2 (two) times daily.    Dispense:  14 capsule    Refill:  0    Order Specific Question:   Supervising Provider    Answer:   Merlyn AlbertLUKING, WILLIAM S [[id number]]   See previous culture.  The only antibiotic besides a quinolone that she can take the will cover both organisms is Macrobid.  Patient also has multiple sensitivities to different antibiotics.  Request that we  avoid a quinolone at this time if possible due to myalgias.  Increase clear fluid intake.  Warning signs reviewed.  Call back in 72 hours if no improvement, call or go to ED sooner if worse.  Will refer to urology for further evaluation.

## 2017-04-10 NOTE — Patient Instructions (Addendum)
Ibuprofen 200 mg  Take 4 (800 mg) up to 3 times per day

## 2017-04-12 ENCOUNTER — Encounter: Payer: Self-pay | Admitting: Nurse Practitioner

## 2017-04-12 LAB — URINE CULTURE

## 2017-04-14 ENCOUNTER — Ambulatory Visit: Payer: BC Managed Care – PPO | Admitting: Nurse Practitioner

## 2017-04-14 ENCOUNTER — Encounter: Payer: Self-pay | Admitting: Nurse Practitioner

## 2017-04-14 VITALS — BP 112/72 | Temp 99.2°F | Ht 67.0 in | Wt 183.0 lb

## 2017-04-14 DIAGNOSIS — B3731 Acute candidiasis of vulva and vagina: Secondary | ICD-10-CM

## 2017-04-14 DIAGNOSIS — B373 Candidiasis of vulva and vagina: Secondary | ICD-10-CM | POA: Diagnosis not present

## 2017-04-14 MED ORDER — TERCONAZOLE 0.4 % VA CREA: 1.0000 | TOPICAL_CREAM | Freq: Every day | VAGINAL | 0 refills | Status: DC

## 2017-04-14 MED ORDER — FLUCONAZOLE 150 MG PO TABS: ORAL_TABLET | ORAL | 0 refills | Status: DC

## 2017-04-15 ENCOUNTER — Encounter: Payer: Self-pay | Admitting: Nurse Practitioner

## 2017-04-15 NOTE — Progress Notes (Signed)
Subjective:  Presents for c/o white vaginal discharge since taking recent Macrobid. Some vaginal itching and irritation. Same sexual partner. Defers need for STD testing. Continues to have urinary symptoms but stable.   Objective:   BP 112/72   Temp 99.2 F (37.3 C) (Oral)   Ht 5\' 7"  (1.702 m)   Wt 183 lb (83 kg)   BMI 28.66 kg/m  NAD. Alert, oriented. External GU: mild external irritation. No lesions. Vagina: a small amount of clumpy white discharge noted. Bimanual exam: no tenderness or obvious masses. No cystocele noted.  Wet prep: epi cells, ph 4.5.  Urine culture neg.   Assessment:  Vaginal candidiasis    Plan:   Meds ordered this encounter  Medications  . fluconazole (DIFLUCAN) 150 MG tablet    Sig: One po qd prn yeast infection; may repeat in 3-4 days if needed    Dispense:  2 tablet    Refill:  0    Order Specific Question:   Supervising Provider    Answer:   Merlyn AlbertLUKING, WILLIAM S [[id number]]  . terconazole (TERAZOL 7) 0.4 % vaginal cream    Sig: Place 1 applicator vaginally at bedtime.    Dispense:  45 g    Refill:  0    Order Specific Question:   Supervising Provider    Answer:   Merlyn AlbertLUKING, WILLIAM S [[id number]]   Start daily Acidophilus to prevent further yeast infections. Has been referred to urology. Call back if urinary symptoms worsen. Hold on Macrobid. May start back if worsening symptoms.

## 2017-04-17 ENCOUNTER — Encounter: Payer: Self-pay | Admitting: Family Medicine

## 2017-04-20 ENCOUNTER — Ambulatory Visit (HOSPITAL_COMMUNITY): Payer: BC Managed Care – PPO | Admitting: Psychiatry

## 2017-04-20 ENCOUNTER — Encounter (HOSPITAL_COMMUNITY): Payer: Self-pay | Admitting: Psychiatry

## 2017-04-20 VITALS — BP 101/68 | HR 83 | Ht 67.0 in | Wt 181.0 lb

## 2017-04-20 DIAGNOSIS — Z818 Family history of other mental and behavioral disorders: Secondary | ICD-10-CM | POA: Diagnosis not present

## 2017-04-20 DIAGNOSIS — F320 Major depressive disorder, single episode, mild: Secondary | ICD-10-CM

## 2017-04-20 DIAGNOSIS — Z811 Family history of alcohol abuse and dependence: Secondary | ICD-10-CM | POA: Diagnosis not present

## 2017-04-20 DIAGNOSIS — Z87891 Personal history of nicotine dependence: Secondary | ICD-10-CM

## 2017-04-20 DIAGNOSIS — Z813 Family history of other psychoactive substance abuse and dependence: Secondary | ICD-10-CM | POA: Diagnosis not present

## 2017-04-20 MED ORDER — BUPROPION HCL ER (SR) 150 MG PO TB12: 150.0000 mg | ORAL_TABLET | Freq: Two times a day (BID) | ORAL | 5 refills | Status: DC

## 2017-04-20 NOTE — Progress Notes (Signed)
BH MD/PA/NP OP Progress Note  04/20/2017 4:37 PM Sheri Wood  MRN:  [id number]  Chief Complaint:  Chief Complaint    Depression; Anxiety; Follow-up     HPI: this patient is a 47 year old married white female who lives with her husband in [place].  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  Patient returns for treatment of depression.  She was last seen about 2 months ago.  Since I last saw her the patient's mom passed away in early 2022/02/25 of pancreatic cancer.  She had gotten diagnosed and passed away within 6 weeks.  This is been very hard on the patient because she and her mom are very close.  She still has crying spells and down periods but she is gone back to work and this is helped her stay busy and occupied.  She thinks the Wellbutrin continues to help her mood.  At times she has difficulty sleeping but melatonin helps.  She does not want anything else for sleep and she is not using the Xanax.  She is going to pursue grief counseling through the hospice program Visit Diagnosis:    ICD-10-CM   1. 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 Allergy    seasonal  . Anxiety   . Depression   . Fibromyalgia   . Gastritis    history  . GERD (gastroesophageal reflux disease)    otc med prn  . IBS (irritable bowel syndrome)   . Migraine   . SVD (spontaneous vaginal delivery)    x 2  . Urticaria, chronic   . Vertigo     Past Surgical History:  Procedure Laterality Date  . COLONOSCOPY  2008   negative  . DIAGNOSTIC LAPAROSCOPY     fibroids  . DILATION AND CURETTAGE OF UTERUS    . DILITATION & CURRETTAGE/HYSTROSCOPY WITH NOVASURE ABLATION N/A 05/06/2013   Procedure: DILATATION & CURETTAGE/HYSTEROSCOPY WITH NOVASURE ABLATION;  Surgeon: Alphonsus Sias. Ernestina Penna,  MD;  Location: [facility];  Service: Gynecology;  Laterality: N/A;  . EYE SURGERY     bilateral lasik  . LAPAROSCOPIC BILATERAL SALPINGECTOMY Bilateral 05/06/2013   Procedure: LAPAROSCOPIC BILATERAL SALPINGECTOMY;  Surgeon: Tresa Endo A. Ernestina Penna, MD;  Location: [facility];  Service: Gynecology;  Laterality: Bilateral;  . WISDOM TOOTH EXTRACTION      Family Psychiatric History: See below  Family History:  Family History  Problem Relation Age of Onset  . Alcohol abuse Brother   . Drug abuse Brother   . Depression Mother   . Anxiety disorder Mother   . Anxiety disorder Brother   . Depression Brother   . Bipolar disorder Maternal Uncle   . Anxiety disorder Paternal Grandfather   . Depression Paternal Grandfather   . Bipolar disorder Cousin     Social History:  Social History   Socioeconomic History  . Marital status: Married    Spouse name: None  . Number of children: None  . Years of education: None  . Highest education level: None  Social Needs  . Financial resource strain: None  . Food insecurity - worry: None  . Food insecurity - inability: None  . Transportation needs - medical: None  . Transportation needs - non-medical: None  Occupational History  . None  Tobacco Use  . Smoking status: Former Smoker  Packs/day: 0.75    Years: 10.00    Pack years: 7.50    Types: Cigarettes    Last attempt to quit: 02/08/1988    Years since quitting: 29.2  . Smokeless tobacco: Never Used  Substance and Sexual Activity  . Alcohol use: No  . Drug use: No  . Sexual activity: Yes    Birth control/protection: None  Other Topics Concern  . None  Social History Narrative  . None    Allergies:  Allergies  Allergen Reactions  . Ciprofloxacin Other (See Comments)    Severe muscle and joint pain  . Cefzil [Cefprozil]     possible hives  . Augmentin [Amoxicillin-Pot Clavulanate] Nausea And Vomiting    Throat felt funny and gave pt anxiety  . Bee Venom Swelling  . Sulfa Antibiotics  Other (See Comments)    Abdominal cramping.    Metabolic Disorder Labs: No results found for: HGBA1C, MPG No results found for: PROLACTIN Lab Results  Component Value Date   CHOL 192 04/02/2015   TRIG 69 04/02/2015   HDL 60 04/02/2015   CHOLHDL 3.2 04/02/2015   VLDL 14 04/02/2015   LDLCALC 118 (H) 04/02/2015   Lab Results  Component Value Date   TSH 1.940 03/23/2015    Therapeutic Level Labs: No results found for: LITHIUM No results found for: VALPROATE No components found for:  CBMZ  Current Medications: Current Outpatient Medications  Medication Sig Dispense Refill  . ALPRAZolam (XANAX) 1 MG tablet Take 1 tablet (1 mg total) by mouth daily as needed for anxiety. 30 tablet 2  . buPROPion (WELLBUTRIN SR) 150 MG 12 hr tablet Take 1 tablet (150 mg total) by mouth 2 (two) times daily. 60 tablet 5  . diazepam (VALIUM) 5 MG tablet TAKE ONE TABLET BY MOUTH EVERY 6 HOURS AS NEEDED FOR DIZZINESS. 30 tablet 0  . fluconazole (DIFLUCAN) 150 MG tablet One po qd prn yeast infection; may repeat in 3-4 days if needed 2 tablet 0  . lisdexamfetamine (VYVANSE) 60 MG capsule Take 1 capsule (60 mg total) by mouth daily. 30 capsule 0  . meclizine (ANTIVERT) 25 MG tablet TAKE ONE TABLET BY MOUTH THREE TIMES DAILY AS NEEDED FOR DIZZINESS. 30 tablet 0  . meloxicam (MOBIC) 15 MG tablet Take 1 tablet (15 mg total) by mouth daily. 30 tablet 0  . nitrofurantoin, macrocrystal-monohydrate, (MACROBID) 100 MG capsule Take 1 capsule (100 mg total) by mouth 2 (two) times daily. 14 capsule 0  . NON FORMULARY Taking clabetazol Cream twice a week    . pramipexole (MIRAPEX) 1.5 MG tablet TAKE 1/2 TABLET BY MOUTH AT BEDTIME. 15 tablet 5  . RELPAX 40 MG tablet TAKE 1 TABLET BY MOUTH AT ONSET OF MIGRAINE, MAY REPEAT ONCE IN 2 HOURS LATER. NO MORE THAN 2 A DAY. 8 tablet 0  . terconazole (TERAZOL 7) 0.4 % vaginal cream Place 1 applicator vaginally at bedtime. 45 g 0   No current facility-administered medications for  this visit.      Musculoskeletal: Strength & Muscle Tone: within normal limits Gait & Station: Normal Patient leans: N/A  Psychiatric Specialty Exam: Review of Systems  Psychiatric/Behavioral: Positive for depression.  All other systems reviewed and are negative.   Blood pressure 101/68, pulse 83, height 5\' 7"  (1.702 m), weight 181 lb (82.1 kg), SpO2 99 %.Body mass index is 28.35 kg/m.  General Appearance: Casual, Neat and Well Groomed  Eye Contact:  Good  Speech:  Clear and Coherent  Volume:  Normal  Mood:  Dysphoric  Affect:  Appropriate  Thought Process:  Goal Directed  Orientation:  Full (Time, Place, and Person)  Thought Content: Rumination   Suicidal Thoughts:  No  Homicidal Thoughts:  No  Memory:  Immediate;   Good Recent;   Good Remote;   Good  Judgement:  Good  Insight:  Good  Psychomotor Activity:  Normal  Concentration:  Concentration: Good and Attention Span: Good  Recall:  Good  Fund of Knowledge: Good  Language: Good  Akathisia:  No  Handed:  Right  AIMS (if indicated): not done  Assets:  Communication Skills Desire for Improvement Physical Health Resilience Social Support Talents/Skills  ADL's:  Intact  Cognition: WNL  Sleep:  Fair   Screenings: PHQ2-9     Office Visit from 02/03/2017 in [facility]  PHQ-2 Total Score  3  PHQ-9 Total Score  7       Assessment and Plan: This patient is a 47 year old female with a history of depression and anxiety.  She has been grieving since her mother recently died but in general her mood has been stable.  She will continue Wellbutrin SR 150 mg twice a day for depression.  She is really not using the Xanax.  Her primary doctor prescribes Vyvanse 60 mg daily for appetite reduction.  She will return to see me in 3 months   Diannia Ruder, MD 04/20/2017, 4:37 PM

## 2017-05-04 ENCOUNTER — Other Ambulatory Visit: Payer: Self-pay | Admitting: Family Medicine

## 2017-05-08 ENCOUNTER — Telehealth: Payer: Self-pay | Admitting: Family Medicine

## 2017-05-08 ENCOUNTER — Other Ambulatory Visit: Payer: Self-pay | Admitting: Nurse Practitioner

## 2017-05-08 MED ORDER — MECLIZINE HCL 25 MG PO TABS: ORAL_TABLET | ORAL | 0 refills | Status: DC

## 2017-05-08 NOTE — Telephone Encounter (Signed)
Went ahead and sent in again.

## 2017-05-08 NOTE — Telephone Encounter (Signed)
Patient calling to check on refill request that [facility] had sent last week for meclizine (ANTIVERT) 25 MG tablet. Last appt here was 04-14-17 with Eber Jonesarolyn.

## 2017-05-09 NOTE — Telephone Encounter (Signed)
Left msg to return call

## 2017-05-10 NOTE — Telephone Encounter (Signed)
Pt returned call; informed med was sent in

## 2017-05-17 ENCOUNTER — Other Ambulatory Visit: Payer: Self-pay | Admitting: Nurse Practitioner

## 2017-05-17 ENCOUNTER — Other Ambulatory Visit: Payer: Self-pay | Admitting: Family Medicine

## 2017-05-17 NOTE — Telephone Encounter (Signed)
Ok plus two monthly ref 

## 2017-05-23 ENCOUNTER — Other Ambulatory Visit: Payer: Self-pay | Admitting: Family Medicine

## 2017-05-26 ENCOUNTER — Ambulatory Visit: Payer: BC Managed Care – PPO | Admitting: Nurse Practitioner

## 2017-05-26 ENCOUNTER — Encounter: Payer: Self-pay | Admitting: Nurse Practitioner

## 2017-05-26 VITALS — BP 118/78 | Temp 98.1°F | Ht 67.0 in | Wt 178.2 lb

## 2017-05-26 DIAGNOSIS — B9689 Other specified bacterial agents as the cause of diseases classified elsewhere: Secondary | ICD-10-CM | POA: Diagnosis not present

## 2017-05-26 DIAGNOSIS — J069 Acute upper respiratory infection, unspecified: Secondary | ICD-10-CM

## 2017-05-26 MED ORDER — FLUCONAZOLE 150 MG PO TABS: ORAL_TABLET | ORAL | 0 refills | Status: DC

## 2017-05-26 MED ORDER — DOXYCYCLINE HYCLATE 100 MG PO TABS: 100.0000 mg | ORAL_TABLET | Freq: Two times a day (BID) | ORAL | 0 refills | Status: DC

## 2017-05-27 ENCOUNTER — Encounter: Payer: Self-pay | Admitting: Nurse Practitioner

## 2017-05-27 NOTE — Progress Notes (Signed)
Subjective:  Presents for c/o cough and congestion that began 4 days ago. Malaise. Worse over the past 48 hours. Began having a fever of 102 last night with severe chills. None today. Sore throat. Frontal area headache. Runny nose. Occasional cough producing clear sputum. No ear pain or wheezing. Taking fluids well.   Objective:   BP 118/78   Temp 98.1 F (36.7 C) (Oral)   Ht 5\' 7"  (1.702 m)   Wt 178 lb 3.2 oz (80.8 kg)   BMI 27.91 kg/m  NAD. Alert, oriented. TMs clear effusion. Pharynx injected with green PND. Neck supple with mild anterior adenopathy. Lungs clear. Heart RRR.   Assessment:  Bacterial upper respiratory infection    Plan:   Meds ordered this encounter  Medications  . doxycycline (VIBRA-TABS) 100 MG tablet    Sig: Take 1 tablet (100 mg total) by mouth 2 (two) times daily.    Dispense:  20 tablet    Refill:  0    Order Specific Question:   Supervising Provider    Answer:   Merlyn AlbertLUKING, WILLIAM S [[id number]]  . fluconazole (DIFLUCAN) 150 MG tablet    Sig: One po qd prn yeast infection; may repeat in 3-4 days if needed    Dispense:  2 tablet    Refill:  0    Order Specific Question:   Supervising Provider    Answer:   Merlyn AlbertLUKING, WILLIAM S [[id number]]   Because of worsening symptoms and upcoming weekend, patient started on antibiotics. Reviewed symptomatic care and warning signs. Call back early next week if no improvement, sooner if worse.

## 2017-06-14 ENCOUNTER — Other Ambulatory Visit: Payer: Self-pay | Admitting: Nurse Practitioner

## 2017-06-14 ENCOUNTER — Encounter: Payer: Self-pay | Admitting: Nurse Practitioner

## 2017-06-14 MED ORDER — LISDEXAMFETAMINE DIMESYLATE 60 MG PO CAPS: 60.0000 mg | ORAL_CAPSULE | Freq: Every day | ORAL | 0 refills | Status: DC

## 2017-06-16 ENCOUNTER — Other Ambulatory Visit: Payer: Self-pay | Admitting: Nurse Practitioner

## 2017-06-19 ENCOUNTER — Other Ambulatory Visit: Payer: Self-pay | Admitting: Nurse Practitioner

## 2017-06-19 ENCOUNTER — Telehealth: Payer: Self-pay | Admitting: Nurse Practitioner

## 2017-06-19 NOTE — Telephone Encounter (Signed)
Requesting refill on Pramipexole Dihydrochloride 1.5 mg.  She is completely out and would like this refilled today.  [facility]

## 2017-06-19 NOTE — Telephone Encounter (Signed)
1/2 tablet at bedtime-was just refilled via rx request from pharmacy 06/19/17

## 2017-06-19 NOTE — Telephone Encounter (Signed)
1/2 or whole tab per day?

## 2017-07-14 ENCOUNTER — Ambulatory Visit: Payer: BC Managed Care – PPO | Admitting: Nurse Practitioner

## 2017-07-14 ENCOUNTER — Encounter: Payer: Self-pay | Admitting: Nurse Practitioner

## 2017-07-14 VITALS — BP 120/78 | Ht 67.0 in | Wt 180.0 lb

## 2017-07-14 DIAGNOSIS — F5081 Binge eating disorder: Secondary | ICD-10-CM | POA: Diagnosis not present

## 2017-07-14 MED ORDER — LISDEXAMFETAMINE DIMESYLATE 70 MG PO CAPS: 70.0000 mg | ORAL_CAPSULE | Freq: Every day | ORAL | 0 refills | Status: DC

## 2017-07-14 NOTE — Progress Notes (Signed)
Subjective: Presents for recheck on binge eating disorder.  Has been on Vyvanse 60 mg for months.  Has noticed lately an increase in her urge for binge eating.  Would like to increase the dosage.  Denies any adverse effects.  No change in sleep.  Objective:   BP 120/78   Ht 5\' 7"  (1.702 m)   Wt 180 lb (81.6 kg)   BMI 28.19 kg/m  NAD.  Alert, oriented.  Lungs clear.  Heart regular rate and rhythm.  Thoughts logical coherent and relevant.  Dressed appropriately.  Making good eye contact.  Assessment:   Problem List Items Addressed This Visit      Other   Binge eating disorder - Primary   Morbid obesity due to excess calories (HCC)   Relevant Medications   lisdexamfetamine (VYVANSE) 70 MG capsule       Plan:   Meds ordered this encounter  Medications  . DISCONTD: lisdexamfetamine (VYVANSE) 70 MG capsule    Sig: Take 1 capsule (70 mg total) by mouth daily.    Dispense:  30 capsule    Refill:  0    Order Specific Question:   Supervising Provider    Answer:   Merlyn AlbertLUKING, WILLIAM S [[id number]]  . DISCONTD: lisdexamfetamine (VYVANSE) 70 MG capsule    Sig: Take 1 capsule (70 mg total) by mouth daily.    Dispense:  30 capsule    Refill:  0    May fill 30 days from 07/14/17    Order Specific Question:   Supervising Provider    Answer:   Merlyn AlbertLUKING, WILLIAM S [[id number]]  . lisdexamfetamine (VYVANSE) 70 MG capsule    Sig: Take 1 capsule (70 mg total) by mouth daily.    Dispense:  30 capsule    Refill:  0    May fill 60 days from 07/14/17    Order Specific Question:   Supervising Provider    Answer:   Merlyn AlbertLUKING, WILLIAM S [[id number]]   Call back if any problems with the 70 mg dose; patient has been advised that this is the highest dose.  Return in about 3 months (around 10/14/2017) for recheck.

## 2017-07-24 ENCOUNTER — Other Ambulatory Visit: Payer: Self-pay | Admitting: Family Medicine

## 2017-07-24 ENCOUNTER — Ambulatory Visit (HOSPITAL_COMMUNITY): Payer: Self-pay | Admitting: Psychiatry

## 2017-07-25 ENCOUNTER — Ambulatory Visit (HOSPITAL_COMMUNITY): Payer: BC Managed Care – PPO | Admitting: Psychiatry

## 2017-08-08 ENCOUNTER — Ambulatory Visit (HOSPITAL_COMMUNITY): Payer: BC Managed Care – PPO | Admitting: Psychiatry

## 2017-08-23 ENCOUNTER — Other Ambulatory Visit: Payer: Self-pay | Admitting: Nurse Practitioner

## 2017-08-23 ENCOUNTER — Encounter: Payer: Self-pay | Admitting: Nurse Practitioner

## 2017-08-23 DIAGNOSIS — M25542 Pain in joints of left hand: Secondary | ICD-10-CM

## 2017-08-23 DIAGNOSIS — M25541 Pain in joints of right hand: Secondary | ICD-10-CM

## 2017-08-28 ENCOUNTER — Telehealth: Payer: Self-pay | Admitting: Family Medicine

## 2017-08-28 NOTE — Telephone Encounter (Signed)
Pt notified. Await decision from insurance company.

## 2017-08-28 NOTE — Telephone Encounter (Signed)
Submitted PA and marked as urgent. Left message to return call.

## 2017-08-28 NOTE — Telephone Encounter (Signed)
Patient is calling to check on prior authorization for Vyvanse.  She only has 1 pill left.

## 2017-08-29 ENCOUNTER — Encounter: Payer: Self-pay | Admitting: Family Medicine

## 2017-08-29 ENCOUNTER — Encounter (INDEPENDENT_AMBULATORY_CARE_PROVIDER_SITE_OTHER): Payer: Self-pay

## 2017-08-29 NOTE — Telephone Encounter (Signed)
Got approval for med. [facility] and med did go through. Pt notified on voicemail.

## 2017-08-29 NOTE — Telephone Encounter (Signed)
Pt called to check on this. Still have not heard back from insurance. Pt notified I will check end of day on it. Sent in yesterday after 4 pm. Marked as urgent. Can take up to 24 hours to hear back from insurance.

## 2017-08-30 ENCOUNTER — Encounter (HOSPITAL_COMMUNITY): Payer: Self-pay | Admitting: Psychiatry

## 2017-08-30 ENCOUNTER — Ambulatory Visit (HOSPITAL_COMMUNITY): Payer: BC Managed Care – PPO | Admitting: Psychiatry

## 2017-08-30 VITALS — BP 102/64 | HR 86 | Ht 67.0 in | Wt 176.0 lb

## 2017-08-30 DIAGNOSIS — G4700 Insomnia, unspecified: Secondary | ICD-10-CM

## 2017-08-30 DIAGNOSIS — Z811 Family history of alcohol abuse and dependence: Secondary | ICD-10-CM | POA: Diagnosis not present

## 2017-08-30 DIAGNOSIS — Z87891 Personal history of nicotine dependence: Secondary | ICD-10-CM

## 2017-08-30 DIAGNOSIS — Z818 Family history of other mental and behavioral disorders: Secondary | ICD-10-CM

## 2017-08-30 DIAGNOSIS — R450 Nervousness: Secondary | ICD-10-CM

## 2017-08-30 DIAGNOSIS — F320 Major depressive disorder, single episode, mild: Secondary | ICD-10-CM

## 2017-08-30 MED ORDER — LISDEXAMFETAMINE DIMESYLATE 70 MG PO CAPS: 70.0000 mg | ORAL_CAPSULE | Freq: Every day | ORAL | 0 refills | Status: DC

## 2017-08-30 MED ORDER — BUPROPION HCL ER (SR) 150 MG PO TB12: 150.0000 mg | ORAL_TABLET | Freq: Two times a day (BID) | ORAL | 5 refills | Status: DC

## 2017-08-30 NOTE — Progress Notes (Signed)
BH MD/PA/NP OP Progress Note  08/30/2017 4:58 PM Sheri Wood  MRN:  [id number]  Chief Complaint:  Chief Complaint    Depression; Anxiety; Follow-up     HPI: This patient is a 47 year old married white female who lives with her husband in [place].  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  The patient returns for treatment of depression.  She was last seen 4 months ago.  The patient is still having a really hard time dealing with her mother's death.  Her mother died last 03-01-22 of pancreatic cancer.  She was diagnosed and went downhill very quickly.  She continues to struggle with this.  She worries about her father who is also grieving.  She is not sleeping much at night often only 2 to 3 hours.  I given her Xanax in the past but she refuses to use it.  She is taking Vyvanse for binge eating disorder but does not think this is keeping her awake.  She still feels sad and wants to cry a lot.  She asked if she could see a therapist here and I think this is a good idea.  She uses melatonin at times for sleep and it does help.  I offered to give her trazodone but she wants to get it on the melatonin more consistently.  She still thinks that the Wellbutrin has helped to some degree but she is not too interested in changing or trying different medications. Visit Diagnosis:    ICD-10-CM   1. 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 Allergy    seasonal  . Anxiety   . Depression   . Fibromyalgia   . Gastritis    history  . GERD (gastroesophageal reflux disease)    otc med prn  . IBS (irritable bowel syndrome)   . Migraine   . SVD (spontaneous vaginal delivery)    x 2  . Urticaria, chronic   . Vertigo     Past Surgical History:  Procedure Laterality Date  .  COLONOSCOPY  2008   negative  . DIAGNOSTIC LAPAROSCOPY     fibroids  . DILATION AND CURETTAGE OF UTERUS    . DILITATION & CURRETTAGE/HYSTROSCOPY WITH NOVASURE ABLATION N/A 05/06/2013   Procedure: DILATATION & CURETTAGE/HYSTEROSCOPY WITH NOVASURE ABLATION;  Surgeon: Alphonsus Sias. Ernestina Penna, MD;  Location: [facility];  Service: Gynecology;  Laterality: N/A;  . EYE SURGERY     bilateral lasik  . LAPAROSCOPIC BILATERAL SALPINGECTOMY Bilateral 05/06/2013   Procedure: LAPAROSCOPIC BILATERAL SALPINGECTOMY;  Surgeon: Tresa Endo A. Ernestina Penna, MD;  Location: [facility];  Service: Gynecology;  Laterality: Bilateral;  . WISDOM TOOTH EXTRACTION      Family Psychiatric History: See below  Family History:  Family History  Problem Relation Age of Onset  . Alcohol abuse Brother   . Drug abuse Brother   . Depression Mother   . Anxiety disorder Mother   . Anxiety disorder Brother   . Depression Brother   . Bipolar disorder Maternal Uncle   . Anxiety disorder Paternal Grandfather   . Depression Paternal Grandfather   . Bipolar disorder Cousin     Social History:  Social History   Socioeconomic History  . Marital status: Married    Spouse name: Not on file  . Number of children: Not  on file  . Years of education: Not on file  . Highest education level: Not on file  Occupational History  . Not on file  Social Needs  . Financial resource strain: Not on file  . Food insecurity:    Worry: Not on file    Inability: Not on file  . Transportation needs:    Medical: Not on file    Non-medical: Not on file  Tobacco Use  . Smoking status: Former Smoker    Packs/day: 0.75    Years: 10.00    Pack years: 7.50    Types: Cigarettes    Last attempt to quit: 02/08/1988    Years since quitting: 29.5  . Smokeless tobacco: Never Used  Substance and Sexual Activity  . Alcohol use: No  . Drug use: No  . Sexual activity: Yes    Birth control/protection: None  Lifestyle  . Physical activity:    Days per week: Not on  file    Minutes per session: Not on file  . Stress: Not on file  Relationships  . Social connections:    Talks on phone: Not on file    Gets together: Not on file    Attends religious service: Not on file    Active member of club or organization: Not on file    Attends meetings of clubs or organizations: Not on file    Relationship status: Not on file  Other Topics Concern  . Not on file  Social History Narrative  . Not on file    Allergies:  Allergies  Allergen Reactions  . Ciprofloxacin Other (See Comments)    Severe muscle and joint pain  . Cefzil [Cefprozil]     possible hives  . Augmentin [Amoxicillin-Pot Clavulanate] Nausea And Vomiting    Throat felt funny and gave pt anxiety  . Bee Venom Swelling  . Sulfa Antibiotics Other (See Comments)    Abdominal cramping.    Metabolic Disorder Labs: No results found for: HGBA1C, MPG No results found for: PROLACTIN Lab Results  Component Value Date   CHOL 192 04/02/2015   TRIG 69 04/02/2015   HDL 60 04/02/2015   CHOLHDL 3.2 04/02/2015   VLDL 14 04/02/2015   LDLCALC 118 (H) 04/02/2015   Lab Results  Component Value Date   TSH 1.940 03/23/2015    Therapeutic Level Labs: No results found for: LITHIUM No results found for: VALPROATE No components found for:  CBMZ  Current Medications: Current Outpatient Medications  Medication Sig Dispense Refill  . ALPRAZolam (XANAX) 1 MG tablet Take 1 tablet (1 mg total) by mouth daily as needed for anxiety. 30 tablet 2  . buPROPion (WELLBUTRIN SR) 150 MG 12 hr tablet Take 1 tablet (150 mg total) by mouth 2 (two) times daily. 60 tablet 5  . diazepam (VALIUM) 5 MG tablet TAKE 1 TABLET BY MOUTH EVERY 6 HOURS AS NEEDED. 30 tablet 2  . doxycycline (VIBRA-TABS) 100 MG tablet Take 1 tablet (100 mg total) by mouth 2 (two) times daily. 20 tablet 0  . eletriptan (RELPAX) 40 MG tablet TAKE 1 TABLET BY MOUTH AT ONSET OF MIGRAINE, MAY REPEAT ONCE IN 2 HOURS LATER. NO MORE THAN 2 A DAY. 8  tablet 0  . fluconazole (DIFLUCAN) 150 MG tablet One po qd prn yeast infection; may repeat in 3-4 days if needed 2 tablet 0  . lisdexamfetamine (VYVANSE) 70 MG capsule Take 1 capsule (70 mg total) by mouth daily. 30 capsule 0  . meclizine (ANTIVERT)  25 MG tablet TAKE (1) TABLET BY MOUTH THREE TIMES DAILY AS NEEDED FOR DIZZINESS. 30 tablet 0  . meclizine (ANTIVERT) 25 MG tablet TAKE ONE TABLET BY MOUTH THREE TIMES DAILY AS NEEDED FOR DIZZINESS. 30 tablet 0  . meloxicam (MOBIC) 15 MG tablet Take 1 tablet (15 mg total) by mouth daily. 30 tablet 0  . nitrofurantoin, macrocrystal-monohydrate, (MACROBID) 100 MG capsule Take 1 capsule (100 mg total) by mouth 2 (two) times daily. 14 capsule 0  . NON FORMULARY Taking clabetazol Cream twice a week    . pramipexole (MIRAPEX) 1.5 MG tablet TAKE 1/2 TABLET BY MOUTH AT BEDTIME. 15 tablet 5  . terconazole (TERAZOL 7) 0.4 % vaginal cream Place 1 applicator vaginally at bedtime. 45 g 0   No current facility-administered medications for this visit.      Musculoskeletal: Strength & Muscle Tone: within normal limits Gait & Station: normal Patient leans: N/A  Psychiatric Specialty Exam: Review of Systems  Psychiatric/Behavioral: The patient is nervous/anxious and has insomnia.   All other systems reviewed and are negative.   Blood pressure 102/64, pulse 86, height 5\' 7"  (1.702 m), weight 176 lb (79.8 kg), SpO2 98 %.Body mass index is 27.57 kg/m.  General Appearance: Casual, Neat and Well Groomed  Eye Contact:  Good  Speech:  Clear and Coherent  Volume:  Normal  Mood:  Anxious  Affect:  Congruent  Thought Process:  Goal Directed  Orientation:  Full (Time, Place, and Person)  Thought Content: Rumination   Suicidal Thoughts:  No  Homicidal Thoughts:  No  Memory:  Immediate;   Good Recent;   Good Remote;   Good  Judgement:  Good  Insight:  Good  Psychomotor Activity:  Decreased  Concentration:  Concentration: Poor and Attention Span: Poor   Recall:  Good  Fund of Knowledge: Good  Language: Good  Akathisia:  No  Handed:  Right  AIMS (if indicated): not done  Assets:  Communication Skills Desire for Improvement Physical Health Resilience Social Support Talents/Skills  ADL's:  Intact  Cognition: WNL  Sleep:  Poor   Screenings: PHQ2-9     Office Visit from 02/03/2017 in [facility]  PHQ-2 Total Score  3  PHQ-9 Total Score  7       Assessment and Plan: Patient is a 47 year old female with a history of depression.  Unfortunately she still having a terrible struggle dealing with her mother's death.  She is not sleeping and I think this is the first thing that needs to be corrected.  I offered trazodone but she will take melatonin nightly and if this does not work she will call me.  We will continue Wellbutrin SR 150 mg twice a day for depression.  She will start counseling here.  She will return to see me in 6 weeks   Diannia Rudereborah Sultana Tierney, MD 08/30/2017, 4:58 PM

## 2017-10-04 ENCOUNTER — Telehealth: Payer: Self-pay | Admitting: Family Medicine

## 2017-10-04 ENCOUNTER — Other Ambulatory Visit: Payer: Self-pay

## 2017-10-04 MED ORDER — ELETRIPTAN HYDROBROMIDE 40 MG PO TABS: ORAL_TABLET | ORAL | 5 refills | Status: DC

## 2017-10-04 NOTE — Telephone Encounter (Signed)
Pt returned call. Pt informed that refills were sent in. Pt verbalized understanding.

## 2017-10-04 NOTE — Telephone Encounter (Signed)
Medication sent in to pharmacy. Left message to return call to notify pt that refills were sent in

## 2017-10-04 NOTE — Telephone Encounter (Signed)
Pt calling to check on refill for eletriptan (RELPAX) 40 MG tablet. She stated the pharmacy had faxed us two request for the medication. A nurse checked threw our faxes and we had not received either request. The pt is completely out of the medication and is hoping a refill could be called in today for her. Please send to  [facility] - [place], [place] - [address]

## 2017-10-04 NOTE — Telephone Encounter (Signed)
Ok six mo worth 

## 2017-10-11 ENCOUNTER — Ambulatory Visit (HOSPITAL_COMMUNITY): Payer: Self-pay | Admitting: Psychiatry

## 2017-10-23 ENCOUNTER — Ambulatory Visit (HOSPITAL_COMMUNITY): Payer: Self-pay | Admitting: Psychiatry

## 2017-11-29 ENCOUNTER — Encounter (HOSPITAL_COMMUNITY): Payer: Self-pay | Admitting: Psychiatry

## 2017-11-29 ENCOUNTER — Ambulatory Visit (HOSPITAL_COMMUNITY): Payer: BC Managed Care – PPO | Admitting: Psychiatry

## 2017-11-29 VITALS — BP 100/66 | HR 89 | Ht 67.0 in | Wt 179.0 lb

## 2017-11-29 DIAGNOSIS — F419 Anxiety disorder, unspecified: Secondary | ICD-10-CM | POA: Diagnosis not present

## 2017-11-29 DIAGNOSIS — F320 Major depressive disorder, single episode, mild: Secondary | ICD-10-CM

## 2017-11-29 MED ORDER — LISDEXAMFETAMINE DIMESYLATE 70 MG PO CAPS: 70.0000 mg | ORAL_CAPSULE | Freq: Every day | ORAL | 0 refills | Status: DC

## 2017-11-29 MED ORDER — BUPROPION HCL ER (SR) 150 MG PO TB12: 150.0000 mg | ORAL_TABLET | Freq: Two times a day (BID) | ORAL | 5 refills | Status: DC

## 2017-11-29 NOTE — Progress Notes (Signed)
BH MD/PA/NP OP Progress Note  11/29/2017 4:22 PM Sheri Wood  MRN:  [id number]  Chief Complaint:  Chief Complaint    Depression; Anxiety; Follow-up     HPI: This patient is a 47 year old married white female who lives with her husband in [place].  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  The patient returns for follow-up for treatment of depression and ADD.  The patient was last seen 3 months ago.  She is back at work full-time and is also cleaning houses part-time.  She states that she is always tired.  However cleaning the house is gives her extra money to pay off bills.  For the most part she is sleeping well.  She still very much mourns the loss of her mom last January to pancreatic cancer.  At times she gets depressed but she is not suicidal and seems to just keep going to matter what.  She does think the medication has helped to some degree.  She rarely has to use the Xanax.  She is finally going to get in with Florencia Reasons for therapy in a couple of months. Visit Diagnosis:    ICD-10-CM   1. 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 Allergy    seasonal  . Anxiety   . Depression   . Fibromyalgia   . Gastritis    history  . GERD (gastroesophageal reflux disease)    otc med prn  . IBS (irritable bowel syndrome)   . Migraine   . SVD (spontaneous vaginal delivery)    x 2  . Urticaria, chronic   . Vertigo     Past Surgical History:  Procedure Laterality Date  . COLONOSCOPY  2008   negative  . DIAGNOSTIC LAPAROSCOPY     fibroids  . DILATION AND CURETTAGE OF UTERUS    . DILITATION & CURRETTAGE/HYSTROSCOPY WITH NOVASURE ABLATION N/A 05/06/2013   Procedure: DILATATION & CURETTAGE/HYSTEROSCOPY WITH NOVASURE ABLATION;  Surgeon: Alphonsus Sias. Ernestina Penna, MD;   Location: [facility];  Service: Gynecology;  Laterality: N/A;  . EYE SURGERY     bilateral lasik  . LAPAROSCOPIC BILATERAL SALPINGECTOMY Bilateral 05/06/2013   Procedure: LAPAROSCOPIC BILATERAL SALPINGECTOMY;  Surgeon: Tresa Endo A. Ernestina Penna, MD;  Location: [facility];  Service: Gynecology;  Laterality: Bilateral;  . WISDOM TOOTH EXTRACTION      Family Psychiatric History: See below  Family History:  Family History  Problem Relation Age of Onset  . Alcohol abuse Brother   . Drug abuse Brother   . Depression Mother   . Anxiety disorder Mother   . Anxiety disorder Brother   . Depression Brother   . Bipolar disorder Maternal Uncle   . Anxiety disorder Paternal Grandfather   . Depression Paternal Grandfather   . Bipolar disorder Cousin     Social History:  Social History   Socioeconomic History  . Marital status: Married    Spouse name: Not on file  . Number of children: Not on file  . Years of education: Not on file  . Highest education level: Not on file  Occupational History  . Not on file  Social Needs  . Financial resource strain: Not on file  . Food insecurity:    Worry: Not on file    Inability: Not on file  .  Transportation needs:    Medical: Not on file    Non-medical: Not on file  Tobacco Use  . Smoking status: Former Smoker    Packs/day: 0.75    Years: 10.00    Pack years: 7.50    Types: Cigarettes    Last attempt to quit: 02/08/1988    Years since quitting: 29.8  . Smokeless tobacco: Never Used  Substance and Sexual Activity  . Alcohol use: No  . Drug use: No  . Sexual activity: Yes    Birth control/protection: None  Lifestyle  . Physical activity:    Days per week: Not on file    Minutes per session: Not on file  . Stress: Not on file  Relationships  . Social connections:    Talks on phone: Not on file    Gets together: Not on file    Attends religious service: Not on file    Active member of club or organization: Not on file    Attends meetings of clubs  or organizations: Not on file    Relationship status: Not on file  Other Topics Concern  . Not on file  Social History Narrative  . Not on file    Allergies:  Allergies  Allergen Reactions  . Ciprofloxacin Other (See Comments)    Severe muscle and joint pain  . Cefzil [Cefprozil]     possible hives  . Augmentin [Amoxicillin-Pot Clavulanate] Nausea And Vomiting    Throat felt funny and gave pt anxiety  . Bee Venom Swelling  . Sulfa Antibiotics Other (See Comments)    Abdominal cramping.    Metabolic Disorder Labs: No results found for: HGBA1C, MPG No results found for: PROLACTIN Lab Results  Component Value Date   CHOL 192 04/02/2015   TRIG 69 04/02/2015   HDL 60 04/02/2015   CHOLHDL 3.2 04/02/2015   VLDL 14 04/02/2015   LDLCALC 118 (H) 04/02/2015   Lab Results  Component Value Date   TSH 1.940 03/23/2015    Therapeutic Level Labs: No results found for: LITHIUM No results found for: VALPROATE No components found for:  CBMZ  Current Medications: Current Outpatient Medications  Medication Sig Dispense Refill  . ALPRAZolam (XANAX) 1 MG tablet Take 1 tablet (1 mg total) by mouth daily as needed for anxiety. 30 tablet 2  . buPROPion (WELLBUTRIN SR) 150 MG 12 hr tablet Take 1 tablet (150 mg total) by mouth 2 (two) times daily. 60 tablet 5  . diazepam (VALIUM) 5 MG tablet TAKE 1 TABLET BY MOUTH EVERY 6 HOURS AS NEEDED. 30 tablet 2  . doxycycline (VIBRA-TABS) 100 MG tablet Take 1 tablet (100 mg total) by mouth 2 (two) times daily. 20 tablet 0  . eletriptan (RELPAX) 40 MG tablet TAKE 1 TABLET BY MOUTH AT ONSET OF MIGRAINE, MAY REPEAT ONCE IN 2 HOURS LATER. NO MORE THAN 2 A DAY. 8 tablet 5  . fluconazole (DIFLUCAN) 150 MG tablet One po qd prn yeast infection; may repeat in 3-4 days if needed 2 tablet 0  . lisdexamfetamine (VYVANSE) 70 MG capsule Take 1 capsule (70 mg total) by mouth daily. 30 capsule 0  . meclizine (ANTIVERT) 25 MG tablet TAKE (1) TABLET BY MOUTH THREE  TIMES DAILY AS NEEDED FOR DIZZINESS. 30 tablet 0  . meclizine (ANTIVERT) 25 MG tablet TAKE ONE TABLET BY MOUTH THREE TIMES DAILY AS NEEDED FOR DIZZINESS. 30 tablet 0  . meloxicam (MOBIC) 15 MG tablet Take 1 tablet (15 mg total) by mouth daily. 30  tablet 0  . nitrofurantoin, macrocrystal-monohydrate, (MACROBID) 100 MG capsule Take 1 capsule (100 mg total) by mouth 2 (two) times daily. 14 capsule 0  . NON FORMULARY Taking clabetazol Cream twice a week    . pramipexole (MIRAPEX) 1.5 MG tablet TAKE 1/2 TABLET BY MOUTH AT BEDTIME. 15 tablet 5  . terconazole (TERAZOL 7) 0.4 % vaginal cream Place 1 applicator vaginally at bedtime. 45 g 0  . lisdexamfetamine (VYVANSE) 70 MG capsule Take 1 capsule (70 mg total) by mouth daily. 30 capsule 0  . lisdexamfetamine (VYVANSE) 70 MG capsule Take 1 capsule (70 mg total) by mouth daily. 30 capsule 0   No current facility-administered medications for this visit.      Musculoskeletal: Strength & Muscle Tone: within normal limits Gait & Station: normal Patient leans: N/A  Psychiatric Specialty Exam: Review of Systems  Psychiatric/Behavioral: Positive for depression.  All other systems reviewed and are negative.   Blood pressure 100/66, pulse 89, height 5\' 7"  (1.702 m), weight 179 lb (81.2 kg), SpO2 100 %.Body mass index is 28.04 kg/m.  General Appearance: Casual, Neat and Well Groomed  Eye Contact:  Good  Speech:  Clear and Coherent  Volume:  Normal  Mood:  Dysphoric  Affect:  Constricted  Thought Process:  Goal Directed  Orientation:  Full (Time, Place, and Person)  Thought Content: Rumination   Suicidal Thoughts:  No  Homicidal Thoughts:  No  Memory:  Immediate;   Good Recent;   Good Remote;   Good  Judgement:  Good  Insight:  Good  Psychomotor Activity:  Normal  Concentration:  Concentration: Good and Attention Span: Good  Recall:  Good  Fund of Knowledge: Good  Language: Good  Akathisia:  No  Handed:  Right  AIMS (if indicated): not  done  Assets:  Communication Skills Desire for Improvement Physical Health Resilience Social Support Talents/Skills  ADL's:  Intact  Cognition: WNL  Sleep:  Good   Screenings: PHQ2-9     Office Visit from 02/03/2017 in [facility]  PHQ-2 Total Score  3  PHQ-9 Total Score  7       Assessment and Plan: This patient is a 47 year old female with a history of depression anxiety binge eating disorder and possible ADD.  She is doing better in terms of sleep.  She is slowly coming around in terms of her grief.  She is finally going to be seeing a therapist.  She will continue Vyvanse 70 mg daily for focus and binge eating, Wellbutrin SR 150 mg twice daily for depression and Xanax 1 mg daily as needed for anxiety.  She will return to see me in 3 months   Diannia Ruder, MD 11/29/2017, 4:22 PM

## 2017-12-05 ENCOUNTER — Other Ambulatory Visit: Payer: Self-pay | Admitting: *Deleted

## 2017-12-05 ENCOUNTER — Telehealth: Payer: Self-pay | Admitting: Family Medicine

## 2017-12-05 MED ORDER — PRAMIPEXOLE DIHYDROCHLORIDE 1.5 MG PO TABS: 0.7500 mg | ORAL_TABLET | Freq: Every day | ORAL | 0 refills | Status: DC

## 2017-12-05 NOTE — Telephone Encounter (Signed)
Pt is checking on status of pramipexole (MIRAPEX) 1.5 MG tablet.  Please send to [facility] - [place], [place] - [address] Pt is out of medication.

## 2017-12-05 NOTE — Telephone Encounter (Signed)
One month sent per dr Brett Canales. Pt is due for office visit. Pt notified and states she will call back to schedule.

## 2017-12-12 ENCOUNTER — Other Ambulatory Visit: Payer: Self-pay

## 2017-12-12 ENCOUNTER — Telehealth: Payer: Self-pay | Admitting: Family Medicine

## 2017-12-12 MED ORDER — MECLIZINE HCL 25 MG PO TABS: ORAL_TABLET | ORAL | 3 refills | Status: DC

## 2017-12-12 NOTE — Telephone Encounter (Signed)
Ok plus three ref 

## 2017-12-12 NOTE — Telephone Encounter (Signed)
Please advise 

## 2017-12-12 NOTE — Telephone Encounter (Signed)
Medication sent in to the requested pharmacy. Pt is aware. 

## 2017-12-12 NOTE — Telephone Encounter (Signed)
Pt is out of her meclizine (ANTIVERT) 25 MG tablet. Please send to [facility] - [place], [place] - [address] The pharmacy states they faxed over a request last week.   Pt has med check scheduled for next week on 11/13 with Lillia Abed.

## 2017-12-18 ENCOUNTER — Other Ambulatory Visit: Payer: Self-pay | Admitting: Family Medicine

## 2017-12-20 ENCOUNTER — Ambulatory Visit: Payer: BC Managed Care – PPO | Admitting: Family Medicine

## 2017-12-21 ENCOUNTER — Other Ambulatory Visit: Payer: Self-pay | Admitting: *Deleted

## 2017-12-21 ENCOUNTER — Telehealth: Payer: Self-pay | Admitting: Family Medicine

## 2017-12-21 MED ORDER — FLUCONAZOLE 150 MG PO TABS: ORAL_TABLET | ORAL | 0 refills | Status: DC

## 2017-12-21 NOTE — Telephone Encounter (Signed)
Vaginal itching and white discharge. Sent in diflucan 150mg  #2 one po three days apart per protocol. Med sent to pharm and pt was notified but then informed me that she also has to use terazol cream also to clear up yeast infection. Please advise

## 2017-12-21 NOTE — Telephone Encounter (Signed)
Ok add terazol cream same way as prescribed historically

## 2017-12-21 NOTE — Telephone Encounter (Signed)
Patient calling to see if she can get something called in for a yeast infection.  Says she has the classic symptoms. Last appt was with Eber Jonesarolyn on 07-14-17  [facility]

## 2017-12-22 ENCOUNTER — Other Ambulatory Visit: Payer: Self-pay | Admitting: *Deleted

## 2017-12-22 MED ORDER — TERCONAZOLE 0.4 % VA CREA: 1.0000 | TOPICAL_CREAM | Freq: Every day | VAGINAL | 0 refills | Status: DC

## 2017-12-22 NOTE — Telephone Encounter (Signed)
Med sent to pharm. Left message to return call  

## 2017-12-22 NOTE — Telephone Encounter (Signed)
Patient notified

## 2017-12-25 ENCOUNTER — Other Ambulatory Visit: Payer: Self-pay | Admitting: Family Medicine

## 2017-12-25 NOTE — Telephone Encounter (Signed)
Ok plus 3 ref 

## 2017-12-28 ENCOUNTER — Ambulatory Visit: Payer: BC Managed Care – PPO | Admitting: Family Medicine

## 2017-12-28 ENCOUNTER — Encounter: Payer: Self-pay | Admitting: Family Medicine

## 2017-12-28 VITALS — BP 112/78 | Ht 67.0 in | Wt 179.0 lb

## 2017-12-28 DIAGNOSIS — G2581 Restless legs syndrome: Secondary | ICD-10-CM | POA: Diagnosis not present

## 2017-12-28 MED ORDER — PRAMIPEXOLE DIHYDROCHLORIDE 1.5 MG PO TABS: 0.7500 mg | ORAL_TABLET | Freq: Every day | ORAL | 11 refills | Status: DC

## 2017-12-28 NOTE — Progress Notes (Signed)
   Subjective:    Patient ID: Sheri GalasJennifer L Wood, female    DOB: Nov 18, 1970, 47 y.o.   MRN: [id number]  HPI  Dr. Tenny Crawoss follows patient's anxiety and depression. She is here today for refills on her mirapex for RLS.  Taking 1/2 tablet of mirapex every night by 7 pm for her RLS, states medication is effective. Goes to bed around 9-10 pm, if she takes by 7 medication is effective.   Recently diagnosed with seronegative RA, seeing Dr. Kathi LudwigSyed for this, started on plaquenil and prednisone.    Review of Systems  Constitutional: Negative for fatigue and unexpected weight change.  Psychiatric/Behavioral: Negative for sleep disturbance.      Objective:   Physical Exam  Constitutional: She is oriented to person, place, and time. She appears well-developed and well-nourished. No distress.  HENT:  Head: Normocephalic and atraumatic.  Neck: Neck supple.  Cardiovascular: Normal rate, regular rhythm and normal heart sounds.  No murmur heard. Pulmonary/Chest: Effort normal and breath sounds normal. No respiratory distress.  Neurological: She is alert and oriented to person, place, and time.  Skin: Skin is warm and dry.  Psychiatric: She has a normal mood and affect.  Nursing note and vitals reviewed.      Assessment & Plan:  Restless leg syndrome  Symptoms currently well controlled on daily mirapex. Will refill x 1 year. Recommend yearly f/u. Pt verbalized understanding.  Dr. Lubertha SouthSteve Luking was consulted on this case and is in agreement with the above treatment plan.

## 2018-02-05 ENCOUNTER — Encounter (HOSPITAL_COMMUNITY): Payer: Self-pay | Admitting: Psychiatry

## 2018-02-05 ENCOUNTER — Ambulatory Visit (INDEPENDENT_AMBULATORY_CARE_PROVIDER_SITE_OTHER): Payer: BC Managed Care – PPO | Admitting: Psychiatry

## 2018-02-05 DIAGNOSIS — F320 Major depressive disorder, single episode, mild: Secondary | ICD-10-CM | POA: Diagnosis not present

## 2018-02-05 NOTE — Progress Notes (Signed)
Comprehensive Clinical Assessment (CCA) Note  02/05/2018 Sheri Wood [id number]  Visit Diagnosis:      ICD-10-CM   1. 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  (See scanned document in Chart Review)  CCA Part Two A  Intake/Chief Complaint:  CCA Intake With Chief Complaint CCA Part Two Date: 02/05/18 CCA Part Two Time: 0925 Chief Complaint/Presenting Problem: I am dealing with the loss of my mother. She was diagnosed with pancreatic cancer in 2017 and died six weeks later on February 12, 2017. She was sick and didn't tell anybody. I can't focus, It is very hard for me to pull things together. I still feel scattered. I also have a history of OCD. I tend to worry about safety of my family all the time.  Patients Currently Reported Symptoms/Problems: poor concentration, difficulty organizing, obcesses about organizing, worry  constantly, binge eating Individual's Strengths: very caring, desire for improvement Individual's Preferences: coping skillsm to manage grief and OCD,  understanding of things from a different perspective  Individual's Abilities: sing and play the piano, does arts and crafts Type of Services Patient Feels Are Needed: Indvidual therapy Initial Clinical Notes/Concerns: Patient is referred for services by psychiatrist Dr. Tenny Crawoss due to patient experiencing symptoms of depresssion. She participated in outpatient therapy for about 2 months 20 years ago due to marital issues.. Patient denies any psychiatric hospitalizations. Patient reports no legal issues.   Mental Health Symptoms Depression:  Depression: Difficulty Concentrating, Fatigue, Increase/decrease in appetite, Irritability, Sleep (too much or little), Tearfulness  Mania:  Mania: N/A  Anxiety:   Anxiety: Difficulty concentrating, Fatigue, Irritability, Restlessness, Tension, Worrying, Sleep  Psychosis:   Psychosis: N/A  Trauma:  Trauma: N/A  Obsessions:  Obsessions: Cause anxiety, Disrupts routine/functioning, Good insight, Intrusive/time consuming, Recurrent & persistent thoughts/impulses/images  Compulsions:  Compulsions: "Driven" to perform behaviors/acts, Disrupts with routine/functioning, Good insight, Intrusive/time consuming  Inattention:  Inattention: N/A  Hyperactivity/Impulsivity:  Hyperactivity/Impulsivity: N/A  Oppositional/Defiant Behaviors:  Oppositional/Defiant Behaviors: N/A  Borderline Personality:  Emotional Irregularity: N/A  Other Mood/Personality Symptoms:     Mental Status Exam Appearance and self-care  Stature:  Stature: Tall  Weight:  Weight: Average weight  Clothing:  Clothing: Neat/clean  Grooming:  Grooming: Normal  Cosmetic use:  Cosmetic Use: Age appropriate  Posture/gait:  Posture/Gait: Normal  Motor activity:  Motor Activity: Not Remarkable  Sensorium  Attention:  Attention: Normal  Concentration:  Concentration: Normal  Orientation:  Orientation: X5  Recall/memory:  Recall/Memory: Normal  Affect and Mood  Affect:  Affect: Appropriate  Mood:  Mood: Anxious, Depressed  Relating  Eye contact:  Eye Contact: Normal  Facial expression:  Facial Expression: Responsive  Attitude toward examiner:  Attitude Toward Examiner: Cooperative  Thought and Language  Speech flow: Speech Flow: Normal  Thought content:  Thought Content: Appropriate to mood and circumstances  Preoccupation:  Preoccupations: Ruminations  Hallucinations:  Hallucinations: (None)  Organization:  logical  Company secretaryxecutive Functions  Fund of Knowledge:  Average  Intelligence:  Intelligence: Average  Abstraction:  Abstraction: Normal  Judgement:  Judgement: Normal  Reality Testing:  Reality Testing: Realistic  Insight:  Insight: Good  Decision Making:  Decision Making: Normal  Social Functioning  Social Maturity:  Social Maturity: Responsible  Social Judgement:  Social Judgement: Normal   Stress  Stressors:  Stressors: Grief/losses, Family conflict  Coping Ability:  Coping Ability: Building surveyorverwhelmed  Skill  Deficits:    Supports:     Family and Psychosocial History: Family history Marital status: Married(Patient and husband reside in [place]) Number of Years Married: 28 What types of issues is patient dealing with in the relationship?: " get along well, normal" Are you sexually active?: Yes What is your sexual orientation?: heterosexual Has your sexual activity been affected by drugs, alcohol, medication, or emotional stress?: no Does patient have children?: Yes How many children?: 2(ages 26 and 24) How is patient's relationship with their children?: " great relationship, sometimes a little strained with my 34 yo daughter who has depression and anxiety'  Childhood History:  Childhood History By whom was/is the patient raised?: Both parents Additional childhood history information: Patient was born and raised in [place]. Description of patient's relationship with caregiver when they were a child: "great" Patient's description of current relationship with people who raised him/her: Mother is deceased, good relationship with father How were you disciplined when you got in trouble as a child/adolescent?: spankings Does patient have siblings?: Yes Number of Siblings: 2 Description of patient's current relationship with siblings: 36 yo brother was murdered by his wife in 2000, great relationship with 87 yo brother Did patient suffer any verbal/emotional/physical/sexual abuse as a child?: No Did patient suffer from severe childhood neglect?: No Has patient ever been sexually abused/assaulted/raped as an adolescent or adult?: No Was the patient ever a victim of a crime or a disaster?: No Witnessed domestic violence?: No Has patient been effected by domestic violence as an adult?: No  CCA Part Two B  Employment/Work Situation: Employment / Work Psychologist, occupational Employment  situation: Employed 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?: No Are There Guns or Other Weapons in Your Home?: Yes Types of Guns/Weapons: handgun Are These Weapons Safely Secured?: Yes(lock and key)  Education: Education Did Garment/textile technologist From [employer]?: Yes Did You Attend College?: Yes(attended [employer] for a year's program = certificate in early childhood development) Did You Have Any Special Interests In School?: cheerleading Did You Have An Individualized Education Program (IIEP): No Did You Have Any Difficulty At [employer]?: Yes(had anxiety as a child, had a lot of ritualistic behaviors, problems  concentrating, ) Were Any Medications Ever Prescribed For These Difficulties?: No  Religion: Religion/Spirituality Are You A Religious Person?: Yes What is Your Religious Affiliation?: Christian How Might This Affect Treatment?: No effect  Leisure/Recreation: Leisure / Recreation Leisure and Hobbies: repurpose furniture and other items, sing and play the piano  Exercise/Diet: Exercise/Diet Do You Exercise?: No Have You Gained or Lost A Significant Amount of Weight in the Past Six Months?: No Do You Follow a Special Diet?: No Do You Have Any Trouble Sleeping?: Yes Explanation of Sleeping Difficulties: difficulty staying asleep( sleeps about 5-6 hours of sleep per night)  CCA Part Two C  Alcohol/Drug Use: Alcohol / Drug Use Pain Medications: see patient record Prescriptions: see patient record Over the Counter: see patient record History of  alcohol / drug use?: No history of alcohol / drug abuse  CCA Part Three  ASAM's:  Six Dimensions of Multidimensional Assessment N/A  Substance use Disorder (SUD)  N/A   Social Function:  Social Functioning Social Maturity: Responsible Social Judgement: Normal  Stress:  Stress Stressors: Grief/losses, Family conflict Coping Ability: Overwhelmed Patient Takes Medications The Way The Doctor Instructed?: Yes Priority Risk: Moderate Risk  Risk Assessment- Self-Harm Potential: Risk Assessment For Self-Harm Potential Thoughts of Self-Harm: No current thoughts Method: No plan Availability of Means: No access/NA  Risk Assessment -Dangerous to Others Potential: Risk Assessment For Dangerous to Others Potential Method: No Plan Availability of Means: No access or NA Intent: Vague intent or NA Notification Required: No need or identified person  DSM5 Diagnoses: Patient Active Problem List   Diagnosis Date Noted  . Binge eating disorder 06/30/2016  . Morbid obesity due to excess calories (HCC) 06/17/2015  . Restless leg syndrome 06/08/2013  . Fibromyalgia 10/12/2012  . Migraine headache without aura 09/17/2012  . Depression with anxiety 09/17/2012    Patient Centered Plan: Patient is on the following Treatment Plan(s):    Recommendations for Services/Supports/Treatments: Recommendations for Services/Supports/Treatments Recommendations For Services/Supports/Treatments: Individual Therapy, Medication Management/ the patient attends the assessment appointment today. Confidentiality and limits are discussed. Patient agrees return for an appointment in 1-2 weeks. Individual therapy is recommended 1 time every 1-2 weeks to process grief and loss issues to have healthy grieving process and improve coping skills. Patient will continue to see psychiatrist Dr. Tenny Crawoss for medication management. Patient agrees to call this practice, call 911, or have someone take her to the emergency room should  symptoms worsen.  Treatment Plan Summary: Will be developed at next appointment   Referrals to Alternative Service(s): Referred to Alternative Service(s):   Place:   Date:   Time:    Referred to Alternative Service(s):   Place:   Date:   Time:    Referred to Alternative Service(s):   Place:   Date:   Time:    Referred to Alternative Service(s):   Place:   Date:   Time:     Kennetha Pearman

## 2018-02-22 ENCOUNTER — Ambulatory Visit (HOSPITAL_COMMUNITY): Payer: BC Managed Care – PPO | Admitting: Psychiatry

## 2018-03-05 ENCOUNTER — Encounter (HOSPITAL_COMMUNITY): Payer: Self-pay | Admitting: Psychiatry

## 2018-03-05 ENCOUNTER — Ambulatory Visit (INDEPENDENT_AMBULATORY_CARE_PROVIDER_SITE_OTHER): Payer: BC Managed Care – PPO | Admitting: Psychiatry

## 2018-03-05 VITALS — BP 106/70 | HR 83 | Ht 67.0 in | Wt 181.0 lb

## 2018-03-05 DIAGNOSIS — F320 Major depressive disorder, single episode, mild: Secondary | ICD-10-CM

## 2018-03-05 MED ORDER — LISDEXAMFETAMINE DIMESYLATE 70 MG PO CAPS: 70.0000 mg | ORAL_CAPSULE | Freq: Every day | ORAL | 0 refills | Status: DC

## 2018-03-05 MED ORDER — BUPROPION HCL ER (SR) 150 MG PO TB12: 150.0000 mg | ORAL_TABLET | Freq: Two times a day (BID) | ORAL | 5 refills | Status: DC

## 2018-03-05 NOTE — Progress Notes (Signed)
BH MD/PA/NP OP Progress Note  03/05/2018 4:59 PM Sheri Wood  MRN:  [id number]  Chief Complaint:  Chief Complaint    Eating Disorder; Anxiety; Depression; Follow-up     HPI: This patient is a 48 year old white female who lives with her husband in [place].  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  The patient returns for follow-up for treatment of depression ADD and binge eating disorder.  The patient returns after 3 months.  Overall she is doing well.  She still has some obsessional symptoms like handwashing and worrying.  She is tried numerous SSRIs and they all cause weight gain and drowsiness.  She is going back into therapy here and perhaps this will help.  She is still missing her mom who died a year ago of pancreatic cancer but she is "coping."  She is doing well at work.  She would like to stay on the Vyvanse because it is really helped with the binge eating and she is also going to change her eating habits.  She denies suicidal ideation.   Visit Diagnosis:    ICD-10-CM   1. 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 Allergy    seasonal  . Anxiety   . Depression   . Fibromyalgia   . Gastritis    history  . GERD (gastroesophageal reflux disease)    otc med prn  . IBS (irritable bowel syndrome)   . Migraine   . SVD (spontaneous vaginal delivery)    x 2  . Urticaria, chronic   . Vertigo     Past Surgical History:  Procedure Laterality Date  . COLONOSCOPY  2008   negative  . DIAGNOSTIC LAPAROSCOPY     fibroids  . DILATION AND CURETTAGE OF UTERUS    . DILITATION & CURRETTAGE/HYSTROSCOPY WITH NOVASURE ABLATION N/A 05/06/2013   Procedure: DILATATION & CURETTAGE/HYSTEROSCOPY WITH NOVASURE ABLATION;  Surgeon: Alphonsus Sias. Ernestina Penna, MD;  Location: [facility];   Service: Gynecology;  Laterality: N/A;  . EYE SURGERY     bilateral lasik  . FOOT SURGERY  2017  . LAPAROSCOPIC BILATERAL SALPINGECTOMY Bilateral 05/06/2013   Procedure: LAPAROSCOPIC BILATERAL SALPINGECTOMY;  Surgeon: Tresa Endo A. Ernestina Penna, MD;  Location: [facility];  Service: Gynecology;  Laterality: Bilateral;  . WISDOM TOOTH EXTRACTION      Family Psychiatric History: see below  Family History:  Family History  Problem Relation Age of Onset  . Alcohol abuse Brother   . Drug abuse Brother   . Depression Mother   . Anxiety disorder Mother   . Anxiety disorder Brother   . Depression Brother   . Bipolar disorder Maternal Uncle   . Anxiety disorder Paternal Grandfather   . Depression Paternal Grandfather   . Bipolar disorder Cousin     Social History:  Social History   Socioeconomic History  . Marital status: Married    Spouse name: Not on file  . Number of children: Not on file  . Years of education: Not on file  . Highest education level: Not on file  Occupational History  . Not on file  Social Needs  . Financial resource strain: Not on file  . Food insecurity:    Worry: Not on file    Inability: Not on file  . Transportation needs:    Medical:  Not on file    Non-medical: Not on file  Tobacco Use  . Smoking status: Former Smoker    Packs/day: 0.75    Years: 10.00    Pack years: 7.50    Types: Cigarettes    Last attempt to quit: 02/08/1988    Years since quitting: 30.0  . Smokeless tobacco: Never Used  Substance and Sexual Activity  . Alcohol use: No  . Drug use: No  . Sexual activity: Yes    Birth control/protection: None  Lifestyle  . Physical activity:    Days per week: Not on file    Minutes per session: Not on file  . Stress: Not on file  Relationships  . Social connections:    Talks on phone: Not on file    Gets together: Not on file    Attends religious service: Not on file    Active member of club or organization: Not on file    Attends meetings of  clubs or organizations: Not on file    Relationship status: Not on file  Other Topics Concern  . Not on file  Social History Narrative  . Not on file    Allergies:  Allergies  Allergen Reactions  . Ciprofloxacin Other (See Comments)    Severe muscle and joint pain  . Cefzil [Cefprozil]     possible hives  . Augmentin [Amoxicillin-Pot Clavulanate] Nausea And Vomiting    Throat felt funny and gave pt anxiety  . Bee Venom Swelling  . Sulfa Antibiotics Other (See Comments)    Abdominal cramping.    Metabolic Disorder Labs: No results found for: HGBA1C, MPG No results found for: PROLACTIN Lab Results  Component Value Date   CHOL 192 04/02/2015   TRIG 69 04/02/2015   HDL 60 04/02/2015   CHOLHDL 3.2 04/02/2015   VLDL 14 04/02/2015   LDLCALC 118 (H) 04/02/2015   Lab Results  Component Value Date   TSH 1.940 03/23/2015    Therapeutic Level Labs: No results found for: LITHIUM No results found for: VALPROATE No components found for:  CBMZ  Current Medications: Current Outpatient Medications  Medication Sig Dispense Refill  . buPROPion (WELLBUTRIN SR) 150 MG 12 hr tablet Take 1 tablet (150 mg total) by mouth 2 (two) times daily. 60 tablet 5  . diazepam (VALIUM) 5 MG tablet TAKE 1 TABLET BY MOUTH EVERY 6 HOURS AS NEEDED. 30 tablet 3  . doxycycline (VIBRA-TABS) 100 MG tablet Take 1 tablet (100 mg total) by mouth 2 (two) times daily. 20 tablet 0  . eletriptan (RELPAX) 40 MG tablet TAKE 1 TABLET BY MOUTH AT ONSET OF MIGRAINE, MAY REPEAT ONCE IN 2 HOURS LATER. NO MORE THAN 2 A DAY. 8 tablet 5  . fluconazole (DIFLUCAN) 150 MG tablet Take one po 3 days apart. 2 tablet 0  . hydroxychloroquine (PLAQUENIL) 200 MG tablet Take by mouth 2 (two) times daily.    [name] lisdexamfetamine (VYVANSE) 70 MG capsule Take 1 capsule (70 mg total) by mouth daily. 30 capsule 0  . lisdexamfetamine (VYVANSE) 70 MG capsule Take 1 capsule (70 mg total) by mouth daily. 30 capsule 0  . lisdexamfetamine  (VYVANSE) 70 MG capsule Take 1 capsule (70 mg total) by mouth daily. 30 capsule 0  . meclizine (ANTIVERT) 25 MG tablet TAKE ONE TABLET BY MOUTH THREE TIMES DAILY AS NEEDED FOR DIZZINESS. 30 tablet 0  . meclizine (ANTIVERT) 25 MG tablet TAKE (1) TABLET BY MOUTH THREE TIMES DAILY AS NEEDED FOR DIZZINESS. 30 tablet  3  . meloxicam (MOBIC) 15 MG tablet Take 1 tablet (15 mg total) by mouth daily. 30 tablet 0  . nitrofurantoin, macrocrystal-monohydrate, (MACROBID) 100 MG capsule Take 1 capsule (100 mg total) by mouth 2 (two) times daily. 14 capsule 0  . NON FORMULARY Taking clabetazol Cream twice a week    . pramipexole (MIRAPEX) 1.5 MG tablet Take 0.5 tablets (0.75 mg total) by mouth at bedtime. 15 tablet 11  . predniSONE (DELTASONE) 20 MG tablet Take 20 mg by mouth daily with breakfast.    . terconazole (TERAZOL 7) 0.4 % vaginal cream Place 1 applicator vaginally at bedtime. 45 g 0   No current facility-administered medications for this visit.      Musculoskeletal: Strength & Muscle Tone: within normal limits Gait & Station: normal Patient leans: N/A  Psychiatric Specialty Exam: Review of Systems  Psychiatric/Behavioral: The patient is nervous/anxious.   All other systems reviewed and are negative.   Blood pressure 106/70, pulse 83, height 5\' 7"  (1.702 m), weight 181 lb (82.1 kg), SpO2 98 %.Body mass index is 28.35 kg/m.  General Appearance: Casual and Fairly Groomed  Eye Contact:  Good  Speech:  Clear and Coherent  Volume:  Normal  Mood:  Anxious  Affect:  Congruent  Thought Process:  Goal Directed  Orientation:  Full (Time, Place, and Person)  Thought Content: Obsessions and Rumination   Suicidal Thoughts:  No  Homicidal Thoughts:  No  Memory:  Immediate;   Good Recent;   Good Remote;   Good  Judgement:  Good  Insight:  Good  Psychomotor Activity:  Normal  Concentration:  Concentration: Good and Attention Span: Good  Recall:  Good  Fund of Knowledge: Good  Language: Good   Akathisia:  No  Handed:  Right  AIMS (if indicated): not done  Assets:  Communication Skills Desire for Improvement Physical Health Resilience Social Support Talents/Skills  ADL's:  Intact  Cognition: WNL  Sleep:  Good   Screenings: GAD-7     [facility] Visit [facility]/2017 in [facility]  Total GAD-7 Score  12    PHQ2-9     [facility]/2017 in [facility]  PHQ-2 Total Score  3  3  PHQ-9 Total Score  9  7       Assessment and Plan: This patient is a 48 year old female with a history of depression binge eating disorder and some anxiety as well as OCD.  She is doing fairly well with her medication but therapy will also be helpful.  She will continue Wellbutrin SR 150 mg twice daily for depression and Vyvanse 70 mg daily for binge eating disorder.  She will return to see me in 3 months   Diannia Rudereborah Joetta Delprado, MD 03/05/2018, 4:59 PM

## 2018-03-08 ENCOUNTER — Ambulatory Visit (HOSPITAL_COMMUNITY): Payer: BC Managed Care – PPO | Admitting: Psychiatry

## 2018-03-20 ENCOUNTER — Telehealth: Payer: Self-pay | Admitting: Family Medicine

## 2018-03-20 ENCOUNTER — Other Ambulatory Visit: Payer: Self-pay | Admitting: *Deleted

## 2018-03-20 MED ORDER — FLUCONAZOLE 150 MG PO TABS: ORAL_TABLET | ORAL | 0 refills | Status: DC

## 2018-03-20 MED ORDER — TERCONAZOLE 0.4 % VA CREA: 1.0000 | TOPICAL_CREAM | Freq: Every day | VAGINAL | 0 refills | Status: DC

## 2018-03-20 NOTE — Telephone Encounter (Signed)
Diflucan 150 numb 3 three d apart, ef terazol cr use as directed

## 2018-03-20 NOTE — Telephone Encounter (Signed)
White vaginal discharge and itching. Pt states the diflucan usually works better if she gets 3 tablets instead of 2 tablets 3. Same directions take 3 days apart and also needs the terazol cream to get rid of yeast infection  [facility]. If she does not answer phone she states may leave a voicemail.

## 2018-03-20 NOTE — Telephone Encounter (Signed)
meds sent to pharm. Pt notified on voicemail.  

## 2018-03-20 NOTE — Telephone Encounter (Signed)
Wants diflucan and terozal 7 cream called in to  [facility] for a yeast infection.

## 2018-03-22 ENCOUNTER — Ambulatory Visit (INDEPENDENT_AMBULATORY_CARE_PROVIDER_SITE_OTHER): Payer: BC Managed Care – PPO | Admitting: Psychiatry

## 2018-03-22 DIAGNOSIS — F320 Major depressive disorder, single episode, mild: Secondary | ICD-10-CM | POA: Diagnosis not present

## 2018-03-22 NOTE — Progress Notes (Signed)
   THERAPIST PROGRESS NOTE  Session Time: 06/16/2022 03/22/2018   4:15 PM - 5:10 PM   Participation Level: Active  Behavioral Response: CasualAlertAnxious/sad  Type of Therapy: Individual Therapy  Treatment Goals addressed: Establish rapport, learn and implement calming skills to manage anxiety and stress  Interventions: CBT and Supportive  Summary: Sheri Wood is a 48 y.o. female who is referred for services by psychiatrist Dr. Tenny Craw due to patient experiencing symptoms of depression. She participated in outpatient therapy for about 2 months 20 years ago due to marital issues.Patient denies any psychiatric hospitalizations. Patient reports no legal issues. She reports unresolved grief and loss issues related to death of mother. Per patient's report, mother was diagnosed with pancreatic cancer in Jun 16, 2015 and died six weeks later on 03-05-2017. Patient also states mother was sick for awhile and didn't tell anybody. She says she can't focus, is having a hard time pulling together, and is feeling scattered. She reports history of OCD and worrying about safety of her family all the time. Other symptoms include binge eating and obsessing about organizing.    Patient last was seen in December for assessment appointment. She reports little to no changes since last session. She reports continued poor concentration, ruminating thoughts, and feeling scattered. 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 Demands related to covering both positions are unrealistic per patient's report and she states feeling  overwhelmed. She states feeling as though she can't catch up and often takes work home during the evenings and weekends. She reports tension with a co-worker who also is the daughter of patient's boss.  Patient continues to have grief and loss issues regarding deceased mother who was patient's best friend per her report. She becomes very emotional as  she discusses mother and reports she tries to stuff her feelings. She reports additional responsibilities regarding taking care of her father and her brother. 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 She reports strong support from her husband and relying on her faith as strengths.    Suicidal/Homicidal: Nowithout intent/plan  Therapist Response: established rapport, discussed stressors, facilitated expression of thoughts and feelings, validated feelings, assisted patient with problem solving regarding requesting/obtaining help on job (i.e..volunteers),began to explore and discuss with patient how avoidance may be affecting her grief and loss issues, provided psychoeducation on stress response and assisted patient identify how she experiences stress in her body, provided rationale for and assisted patient practice deep breathing to trigger relaxation response, developed plan with patient to practice deep breathing 5-10 minutes 2 x per day.   Plan: Return again in 2 weeks.  Diagnosis: Axis I: MDD, Single Episode, mild    Axis II: No diagnosis    Adah Salvage, LCSW 03/22/2018

## 2018-03-23 ENCOUNTER — Encounter (HOSPITAL_COMMUNITY): Payer: Self-pay | Admitting: Psychiatry

## 2018-04-12 ENCOUNTER — Ambulatory Visit (INDEPENDENT_AMBULATORY_CARE_PROVIDER_SITE_OTHER): Payer: BC Managed Care – PPO | Admitting: Psychiatry

## 2018-04-12 DIAGNOSIS — F320 Major depressive disorder, single episode, mild: Secondary | ICD-10-CM

## 2018-04-12 NOTE — Progress Notes (Signed)
   THERAPIST PROGRESS NOTE  Session Time: Thursday 04/12/2018 4:18 PM -  4:58 PM           Participation Level: Active  Behavioral Response: CasualAlertAnxious/sad  Type of Therapy: Individual Therapy  Treatment Goals addressed:  learn and implement calming skills to manage anxiety and stress  Interventions: CBT and Supportive  Summary: ZOA FONDREN is a 48 y.o. female who is referred for services by psychiatrist Dr. Tenny Craw due to patient experiencing symptoms of depression. She participated in outpatient therapy for about 2 months 20 years ago due to marital issues.Patient denies any psychiatric hospitalizations. Patient reports no legal issues. She reports unresolved grief and loss issues related to death of mother. Per patient's report, mother was diagnosed with pancreatic cancer in 06-01-15 and died six weeks later on 02-19-17. Patient also states mother was sick for awhile and didn't tell anybody. She says she can't focus, is having a hard time pulling together, and is feeling scattered. She reports history of OCD and worrying about safety of her family all the time. Other symptoms include binge eating and obsessing about organizing.    Patient last was seen about two weeks. She says she has been practicing deep breathing in the evenings most days and reports it has been helpful. She  She reports decreased stress regarding co-worker as there has been improvement in their working relationship. 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 She reports continuing to have a demanding work load. She also reports continued multiple responsibilities and having little time for self. She states being very tired. She is particularly stressed today about contact with her father and her brother. She worries about her father as he is a widow and reports feelings of guilt when she doesn't talk to him. Contact with him is stressful as he calls her frequently and depends on her for a variety of things per her  report. Her father and brother have a tense relationship with both complain to her about each other. Patient reports feeling caught in the middle. Patient reports having little time to self on weekends as she babysits her grandson every Saturday and spends most of her day in church on Sundays. Suicidal/Homicidal: Nowithout intent/plan  Therapist Response: praised and reinforced patient's use of deep breathing, discussed effects,  discussed stressors, facilitated expression of thoughts and feelings, validated feelings, assisted patient identify and examine her thought patterns that evoke inappropriate guilt and replace with more helpful thoughts, assisted patient examine her pattern of interaction with her father/brother, assisted patient develop plan to begin to set limits ( initiate calls based on her schedule and set time limits for call, choose when to answer the phone), discussed role of self care and time for self, assisted patient develop plan to increase time for self-care, self-nurture ( conversation with daughter about needing one Saturday off a month, taking one Sunday a month off from church )assigned patient to  practice deep breathing 5-10 minutes 2 x per day.   Plan: Return again in 2 weeks.  Diagnosis: Axis I: MDD, Single Episode, mild    Axis II: No diagnosis    Adah Salvage, LCSW 04/12/2018

## 2018-05-17 ENCOUNTER — Other Ambulatory Visit: Payer: Self-pay | Admitting: Family Medicine

## 2018-05-29 ENCOUNTER — Ambulatory Visit (HOSPITAL_COMMUNITY): Payer: BC Managed Care – PPO | Admitting: Psychiatry

## 2018-05-29 ENCOUNTER — Other Ambulatory Visit: Payer: Self-pay

## 2018-05-30 ENCOUNTER — Ambulatory Visit (INDEPENDENT_AMBULATORY_CARE_PROVIDER_SITE_OTHER): Payer: BC Managed Care – PPO | Admitting: Family Medicine

## 2018-05-30 DIAGNOSIS — H6503 Acute serous otitis media, bilateral: Secondary | ICD-10-CM | POA: Diagnosis not present

## 2018-05-30 DIAGNOSIS — N390 Urinary tract infection, site not specified: Secondary | ICD-10-CM

## 2018-05-30 MED ORDER — SULFAMETHOXAZOLE-TRIMETHOPRIM 800-160 MG PO TABS: 1.0000 | ORAL_TABLET | Freq: Two times a day (BID) | ORAL | 0 refills | Status: DC

## 2018-05-30 NOTE — Progress Notes (Signed)
   Subjective:    Patient ID: Sheri Wood, female    DOB: Aug 02, 1970, 48 y.o.   MRN: [id number] Video plus audio HPI  Patient states she has been having burning with urination and cloudy urine for 1.5-2 weeks. Patient is also having bilateral ear pain and sinus issues for a while. Patient states it started as allergies but feels it is now more.  Virtual Visit via Video Note  I connected with Sheri Wood on 05/30/18 at  2:00 PM EDT by a video enabled telemedicine application and verified that I am speaking with the correct person using two identifiers.   I discussed the limitations of evaluation and management by telemedicine and the availability of in person appointments. The patient expressed understanding and agreed to proceed.  History of Present Illness:    Observations/Objective:   Assessment and Plan:   Follow Up Instructions:    I discussed the assessment and treatment plan with the patient. The patient was provided an opportunity to ask questions and all were answered. The patient agreed with the plan and demonstrated an understanding of the instructions.   The patient was advised to call back or seek an in-person evaluation if the symptoms worsen or if the condition fails to improve as anticipated.  I provided 18 minutes of non-face-to-face time during this encounter.      Review of Systems No headache, no major weight loss or weight gain, no chest pain no back pain abdominal pain no change in bowel habits complete ROS otherwise negative     Objective:   Physical Exam   Virtual visit     Assessment & Plan:  Impression #1 probable otitis media.  Following recent substantial upper airway congestion.  2.  Probable urinary tract infection.  Discussed.  Plan appropriate antibiotic to cover for both.  Warning signs discussed

## 2018-05-31 ENCOUNTER — Encounter: Payer: Self-pay | Admitting: Family Medicine

## 2018-06-04 ENCOUNTER — Ambulatory Visit (INDEPENDENT_AMBULATORY_CARE_PROVIDER_SITE_OTHER): Payer: BC Managed Care – PPO | Admitting: Psychiatry

## 2018-06-04 ENCOUNTER — Encounter (HOSPITAL_COMMUNITY): Payer: Self-pay | Admitting: Psychiatry

## 2018-06-04 ENCOUNTER — Other Ambulatory Visit: Payer: Self-pay

## 2018-06-04 DIAGNOSIS — F320 Major depressive disorder, single episode, mild: Secondary | ICD-10-CM | POA: Diagnosis not present

## 2018-06-04 MED ORDER — LISDEXAMFETAMINE DIMESYLATE 70 MG PO CAPS: 70.0000 mg | ORAL_CAPSULE | Freq: Every day | ORAL | 0 refills | Status: DC

## 2018-06-04 MED ORDER — BUPROPION HCL ER (SR) 150 MG PO TB12: 150.0000 mg | ORAL_TABLET | Freq: Two times a day (BID) | ORAL | 5 refills | Status: DC

## 2018-06-04 MED ORDER — ALPRAZOLAM 1 MG PO TABS: 1.0000 mg | ORAL_TABLET | Freq: Every day | ORAL | 2 refills | Status: DC | PRN

## 2018-06-04 NOTE — Progress Notes (Signed)
Virtual Visit via Telephone Note  I connected with Sheri Wood on 06/04/18 at  4:20 PM EDT by telephone and verified that I am speaking with the correct person using two identifiers.   I discussed the limitations, risks, security and privacy concerns of performing an evaluation and management service by telephone and the availability of in person appointments. I also discussed with the patient that there may be a patient responsible charge related to this service. The patient expressed understanding and agreed to proceed.       I discussed the assessment and treatment plan with the patient. The patient was provided an opportunity to ask questions and all were answered. The patient agreed with the plan and demonstrated an understanding of the instructions.   The patient was advised to call back or seek an in-person evaluation if the symptoms worsen or if the condition fails to improve as anticipated.  I provided 15 minutes of non-face-to-face time during this encounter.   Diannia Ruder, MD  [facility] MD/PA/NP OP Progress Note  06/04/2018 4:45 PM Sheri Wood  MRN:  [id number]  Chief Complaint:  Chief Complaint    Depression; Follow-up     HPI: This patient is a 48 year old white female who lives with her husband in [place].  She is a Patent examiner for the [employer]  The patient returns for follow-up of depression ADHD and binge eating disorder.  The patient states that she has had a lot more anxiety lately.  She is seen by phone visit today because of the coronavirus pandemic.  She has had to work from home and has had severe sinusitis and fluid in her ears and has gotten vertigo.  She forgot to do an important assignment for work and now she is worried about her boss getting angry.  She also does not think the Vyvanse is working quite as well for her binge eating as it did in the past.  She had tried phentermine in the past but this caused her to  have migraine.  I explained that there are not a lot of medications FDA approved for this disorder but perhaps when the pandemic eases we can get her into therapy with cognitive behavioral therapist.  She is having more anxiety and worry and difficulty sleeping so I suggested we reinstate the Xanax and she agrees.  She thinks the Wellbutrin is helping her for the most part with the depression but she is still having a lot of perimenopausal anxiety and food cravings. Visit Diagnosis:    ICD-10-CM   1. 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 Allergy    seasonal  . Anxiety   . Depression   . Fibromyalgia   . Gastritis    history  . GERD (gastroesophageal reflux disease)    otc med prn  . IBS (irritable bowel syndrome)   . Migraine   . SVD (spontaneous vaginal delivery)    x 2  . Urticaria, chronic   . Vertigo     Past Surgical History:  Procedure Laterality Date  . COLONOSCOPY  2008   negative  . DIAGNOSTIC LAPAROSCOPY     fibroids  . DILATION AND CURETTAGE OF UTERUS    . DILITATION & CURRETTAGE/HYSTROSCOPY WITH NOVASURE ABLATION N/A 05/06/2013   Procedure: DILATATION & CURETTAGE/HYSTEROSCOPY WITH NOVASURE  ABLATION;  Surgeon: Alphonsus Sias. Ernestina Penna, MD;  Location: [facility];  Service: Gynecology;  Laterality: N/A;  . EYE SURGERY     bilateral lasik  . FOOT SURGERY  2017  . LAPAROSCOPIC BILATERAL SALPINGECTOMY Bilateral 05/06/2013   Procedure: LAPAROSCOPIC BILATERAL SALPINGECTOMY;  Surgeon: Tresa Endo A. Ernestina Penna, MD;  Location: [facility];  Service: Gynecology;  Laterality: Bilateral;  . WISDOM TOOTH EXTRACTION      Family Psychiatric History: See below  Family History:  Family History  Problem Relation Age of Onset  . Alcohol abuse Brother   . Drug abuse Brother   . Depression Mother   . Anxiety disorder  Mother   . Anxiety disorder Brother   . Depression Brother   . Bipolar disorder Maternal Uncle   . Anxiety disorder Paternal Grandfather   . Depression Paternal Grandfather   . Bipolar disorder Cousin     Social History:  Social History   Socioeconomic History  . Marital status: Married    Spouse name: Not on file  . Number of children: Not on file  . Years of education: Not on file  . Highest education level: Not on file  Occupational History  . Not on file  Social Needs  . Financial resource strain: Not on file  . Food insecurity:    Worry: Not on file    Inability: Not on file  . Transportation needs:    Medical: Not on file    Non-medical: Not on file  Tobacco Use  . Smoking status: Former Smoker    Packs/day: 0.75    Years: 10.00    Pack years: 7.50    Types: Cigarettes    Last attempt to quit: 02/08/1988    Years since quitting: 30.3  . Smokeless tobacco: Never Used  Substance and Sexual Activity  . Alcohol use: No  . Drug use: No  . Sexual activity: Yes    Birth control/protection: None  Lifestyle  . Physical activity:    Days per week: Not on file    Minutes per session: Not on file  . Stress: Not on file  Relationships  . Social connections:    Talks on phone: Not on file    Gets together: Not on file    Attends religious service: Not on file    Active member of club or organization: Not on file    Attends meetings of clubs or organizations: Not on file    Relationship status: Not on file  Other Topics Concern  . Not on file  Social History Narrative  . Not on file    Allergies:  Allergies  Allergen Reactions  . Ciprofloxacin Other (See Comments)    Severe muscle and joint pain  . Cefzil [Cefprozil]     possible hives  . Augmentin [Amoxicillin-Pot Clavulanate] Nausea And Vomiting    Throat felt funny and gave pt anxiety  . Bee Venom Swelling    Metabolic Disorder Labs: No results found for: HGBA1C, MPG No results found for:  PROLACTIN Lab Results  Component Value Date   CHOL 192 04/02/2015   TRIG 69 04/02/2015   HDL 60 04/02/2015   CHOLHDL 3.2 04/02/2015   VLDL 14 04/02/2015   LDLCALC 118 (H) 04/02/2015   Lab Results  Component Value Date   TSH 1.940 03/23/2015    Therapeutic Level Labs: No results found for: LITHIUM No results found for: VALPROATE No components found for:  CBMZ  Current Medications: Current Outpatient Medications  Medication Sig Dispense Refill  .  ALPRAZolam (XANAX) 1 MG tablet Take 1 tablet (1 mg total) by mouth daily as needed for anxiety. 30 tablet 2  . buPROPion (WELLBUTRIN SR) 150 MG 12 hr tablet Take 1 tablet (150 mg total) by mouth 2 (two) times daily. 60 tablet 5  . diazepam (VALIUM) 5 MG tablet TAKE 1 TABLET BY MOUTH EVERY 6 HOURS AS NEEDED. 30 tablet 3  . doxycycline (VIBRA-TABS) 100 MG tablet Take 1 tablet (100 mg total) by mouth 2 (two) times daily. (Patient not taking: Reported on 03/22/2018) 20 tablet 0  . eletriptan (RELPAX) 40 MG tablet TAKE 1 TABLET BY MOUTH AT ONSET OF MIGRAINE, MAY REPEAT ONCE IN 2 HOURS LATER. NO MORE THAN 2 A DAY. 8 tablet 5  . fluconazole (DIFLUCAN) 150 MG tablet Take one po every 3 days. (Patient not taking: Reported on 05/30/2018) 3 tablet 0  . hydroxychloroquine (PLAQUENIL) 200 MG tablet Take by mouth 2 (two) times daily.    [name]. lisdexamfetamine (VYVANSE) 70 MG capsule Take 1 capsule (70 mg total) by mouth daily. 30 capsule 0  . lisdexamfetamine (VYVANSE) 70 MG capsule Take 1 capsule (70 mg total) by mouth daily. 30 capsule 0  . lisdexamfetamine (VYVANSE) 70 MG capsule Take 1 capsule (70 mg total) by mouth daily. 30 capsule 0  . meclizine (ANTIVERT) 25 MG tablet TAKE ONE TABLET BY MOUTH THREE TIMES DAILY AS NEEDED FOR DIZZINESS. 30 tablet 0  . meclizine (ANTIVERT) 25 MG tablet TAKE (1) TABLET BY MOUTH THREE TIMES DAILY AS NEEDED FOR DIZZINESS. 30 tablet 3  . meclizine (ANTIVERT) 25 MG tablet TAKE (1) TABLET BY MOUTH THREE TIMES DAILY AS NEEDED FOR  DIZZINESS. 30 tablet 3  . meloxicam (MOBIC) 15 MG tablet Take 1 tablet (15 mg total) by mouth daily. (Patient not taking: Reported on 03/22/2018) 30 tablet 0  . nitrofurantoin, macrocrystal-monohydrate, (MACROBID) 100 MG capsule Take 1 capsule (100 mg total) by mouth 2 (two) times daily. (Patient not taking: Reported on 03/22/2018) 14 capsule 0  . NON FORMULARY Taking clabetazol Cream twice a week    . pramipexole (MIRAPEX) 1.5 MG tablet Take 0.5 tablets (0.75 mg total) by mouth at bedtime. 15 tablet 11  . predniSONE (DELTASONE) 20 MG tablet Take 20 mg by mouth daily with breakfast.    . sulfamethoxazole-trimethoprim (BACTRIM DS) 800-160 MG tablet Take 1 tablet by mouth 2 (two) times daily. 20 tablet 0  . terconazole (TERAZOL 7) 0.4 % vaginal cream Place 1 applicator vaginally at bedtime. 45 g 0   No current facility-administered medications for this visit.      Musculoskeletal: Strength & Muscle Tone: n/a Gait & Station:  Patient leans:   Psychiatric Specialty Exam: Review of Systems  Psychiatric/Behavioral: The patient is nervous/anxious and has insomnia.   All other systems reviewed and are negative.   There were no vitals taken for this visit.There is no height or weight on file to calculate BMI.  General Appearance: NA  Eye Contact:  NA  Speech:  Clear and Coherent  Volume:  Normal  Mood:  Anxious and Dysphoric  Affect:  NA  Thought Process:  Goal Directed  Orientation:  Full (Time, Place, and Person)  Thought Content: Rumination   Suicidal Thoughts:  No  Homicidal Thoughts:  No  Memory:  Immediate;   Good Recent;   Good Remote;   Good  Judgement:  Good  Insight:  Good  Psychomotor Activity:  Decreased  Concentration:  Concentration: Good and Attention Span: Good  Recall:  Good  Fund of Knowledge: Good  Language: Good  Akathisia:  No  Handed:  Right  AIMS (if indicated): not done  Assets:  Communication Skills Desire for Improvement Resilience Social  Support Talents/Skills  ADL's:  Intact  Cognition: WNL  Sleep:  Poor   Screenings: GAD-7     Office Visit from 12/28/2017 in [facility]  Total GAD-7 Score  12    PHQ2-9     Office Visit from 12/28/2017 in [facility] Office Visit from 02/03/2017 in [facility]  PHQ-2 Total Score  3  3  PHQ-9 Total Score  9  7       Assessment and Plan:  This patient is a 48 year old female with a history of binge eating disorder depression and anxiety.  She may have a little bit of ADD as well.  At this point I cannot think of any other medication to help with binge eating so we will continue Vyvanse 70 mg daily for now.  She will continue Wellbutrin SR 150 mg twice daily for depression and add Xanax 1 mg daily as needed for anxiety.  She will return to see me in 3 months  Diannia Ruder, MD 06/04/2018, 4:45 PM

## 2018-06-12 ENCOUNTER — Other Ambulatory Visit: Payer: Self-pay

## 2018-06-12 ENCOUNTER — Encounter (HOSPITAL_COMMUNITY): Payer: Self-pay | Admitting: Psychiatry

## 2018-06-12 ENCOUNTER — Ambulatory Visit (INDEPENDENT_AMBULATORY_CARE_PROVIDER_SITE_OTHER): Payer: BC Managed Care – PPO | Admitting: Psychiatry

## 2018-06-12 DIAGNOSIS — F320 Major depressive disorder, single episode, mild: Secondary | ICD-10-CM | POA: Diagnosis not present

## 2018-06-12 NOTE — Progress Notes (Signed)
Virtual Visit via Video Note  I connected with Sheri Wood on 06/12/18 at  3:00 PM EDT by a video enabled telemedicine application and verified that I am speaking with the correct person using two identifiers.   I discussed the limitations of evaluation and management by telemedicine and the availability of in person appointments. The patient expressed understanding and agreed to proceed.  I provided 55 minutes of non-face-to-face time during this encounter.   Sheri Salvage, LCSW    THERAPIST PROGRESS NOTE  Session Time: Tuesday 06/12/2018 3:00 PM - 3:55 PM            Participation Level: Active  Behavioral Response: CasualAlertAnxious/sad  Type of Therapy: Individual Therapy  Treatment Goals addressed:  learn and implement calming skills to manage anxiety and stress  Interventions: CBT and Supportive  Summary: Sheri Wood is a 48 y.o. female who is referred for services by psychiatrist Dr. Tenny Craw due to patient experiencing symptoms of depression. She participated in outpatient therapy for about 2 months 20 years ago due to marital issues.Patient denies any psychiatric hospitalizations. Patient reports no legal issues. She reports unresolved grief and loss issues related to death of mother. Per patient's report, mother was diagnosed with pancreatic cancer in Jun 17, 2015 and died six weeks later on March 07, 2017. Patient also states mother was sick for awhile and didn't tell anybody. She says she can't focus, is having a hard time pulling together, and is feeling scattered. She reports history of OCD and worrying about safety of her family all the time. Other symptoms include binge eating and obsessing about organizing.    Patient last was seen in March 2020. She reports increased depressed mood, binge eating, fatigue, poor motivation, and anxiety since last session. She is working from home due to impact of coronavirus pandemic and states now having more time to think and worry. She  continues to have grief and loss issues about her mother. She reports stress related to father who continues to call frequently. She also reports still feeling caught in the middle regarding her father's relationship with her brother. She expresses reluctance to set boundaries with them as she fears hurting their feelings and also experiences guilt.   Suicidal/Homicidal: Nowithout intent/plan  Therapist Response: reviewed symptoms, discussed stressors, facilitated expression of thoughts and feelings, validated feelings, began to identify and prioritize treatment goals ( process grief and loss issues, learn and implement assertiveness skills and set limits, improve self-care (eating patterns, exercise), assisted patient develop plan to improve self -care through exercise ( exercise 15- 20 minutes per day 3 x per week MWF), assisted patient identify/address thoughts and processes that may inhibit implementation of plan, assisted patient develop list of benefits of exercising and consequences of not exercising, assigned her to review lists daily   Plan: Return again in 2 weeks.  Diagnosis: Axis I: MDD, Single Episode, mild    Axis II: No diagnosis    Sheri Salvage, LCSW 06/12/2018

## 2018-06-26 ENCOUNTER — Ambulatory Visit (HOSPITAL_COMMUNITY): Payer: BC Managed Care – PPO | Admitting: Psychiatry

## 2018-07-10 ENCOUNTER — Ambulatory Visit (HOSPITAL_COMMUNITY): Payer: BC Managed Care – PPO | Admitting: Psychiatry

## 2018-07-11 ENCOUNTER — Telehealth (HOSPITAL_COMMUNITY): Payer: Self-pay | Admitting: *Deleted

## 2018-07-11 NOTE — Telephone Encounter (Signed)
Patient getting anxiety all day and palpitations in the evening. Told her to stop the vyvanse for a week or so and see if these symptoms clear, take xanax at bedtime

## 2018-07-11 NOTE — Telephone Encounter (Signed)
Dr Tenny Craw Patient called & stated that she's having side effects from the medication. And that she wants to stop taking it & asked how would she need to do that?

## 2018-07-24 ENCOUNTER — Ambulatory Visit (HOSPITAL_COMMUNITY): Payer: BC Managed Care – PPO | Admitting: Psychiatry

## 2018-08-03 ENCOUNTER — Other Ambulatory Visit: Payer: Self-pay

## 2018-08-03 ENCOUNTER — Encounter (HOSPITAL_COMMUNITY): Payer: Self-pay | Admitting: Psychiatry

## 2018-08-03 ENCOUNTER — Ambulatory Visit (INDEPENDENT_AMBULATORY_CARE_PROVIDER_SITE_OTHER): Payer: BC Managed Care – PPO | Admitting: Psychiatry

## 2018-08-03 DIAGNOSIS — F320 Major depressive disorder, single episode, mild: Secondary | ICD-10-CM | POA: Diagnosis not present

## 2018-08-03 MED ORDER — FLUOXETINE HCL 10 MG PO CAPS: 10.0000 mg | ORAL_CAPSULE | Freq: Every day | ORAL | 2 refills | Status: DC

## 2018-08-03 MED ORDER — ALPRAZOLAM 1 MG PO TABS: 1.0000 mg | ORAL_TABLET | Freq: Every day | ORAL | 2 refills | Status: DC | PRN

## 2018-08-03 MED ORDER — NALTREXONE HCL 50 MG PO TABS: 50.0000 mg | ORAL_TABLET | Freq: Every day | ORAL | 2 refills | Status: DC

## 2018-08-03 MED ORDER — BUPROPION HCL ER (SR) 150 MG PO TB12: 150.0000 mg | ORAL_TABLET | Freq: Two times a day (BID) | ORAL | 5 refills | Status: DC

## 2018-08-03 NOTE — Progress Notes (Signed)
Virtual Visit via Video Note  I connected with Sheri Wood on 08/03/18 at  9:40 AM EDT by a video enabled telemedicine application and verified that I am speaking with the correct person using two identifiers.   I discussed the limitations of evaluation and management by telemedicine and the availability of in person appointments. The patient expressed understanding and agreed to proceed.    I discussed the assessment and treatment plan with the patient. The patient was provided an opportunity to ask questions and all were answered. The patient agreed with the plan and demonstrated an understanding of the instructions.   The patient was advised to call back or seek an in-person evaluation if the symptoms worsen or if the condition fails to improve as anticipated.  I provided 15 minutes of non-face-to-face time during this encounter.   Diannia Rudereborah Nasier Thumm, MD  [facility]/PA/NP OP Progress Note  08/03/2018 9:59 AM Sheri Wood  MRN:  [id number]  Chief Complaint:  Chief Complaint    Depression; Anxiety; Follow-up     HPI: The patient is a 48 year old white female who [place].  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  She is seen via video again today.  She is called us in between and stated that the Vyvanse was causing palpitations and she has stopped it.  The palpitations have and anxiety have gotten a lot better without it.  I also called in some Xanax that she can use if needed and she is only used it a few times.  She states however that the depression seems to be somewhat worse and she would like to add a bit of Prozac that she had been on in the past and this is reasonable idea.  She also is continuing to deal with the binge eating and has gained 10 pounds in the last couple of months.  The Vyvanse  helped with that but she could not tolerate the anxiety and palpitations.  She is already on Wellbutrin and I explained that there is a drug called Contrave which is a combination of Wellbutrin and naltrexone and it seems to help the binge eating.  Since she is already on Wellbutrin we can add naltrexone to her regimen and she agrees. Visit Diagnosis:    ICD-10-CM   1. Mild single current episode of major depressive disorder (HCC)  F32.0     Past Psychiatric History: Long-term outpatient treatment for depression and anxiety  Past Medical History:  Past Medical History:  Diagnosis Date  . Allergy    seasonal  . Anxiety   . Depression   . Fibromyalgia   . Gastritis    history  . GERD (gastroesophageal reflux disease)    otc med prn  . IBS (irritable bowel syndrome)   . Migraine   . SVD (spontaneous vaginal delivery)    x 2  . Urticaria, chronic   . Vertigo     Past Surgical History:  Procedure Laterality Date  . COLONOSCOPY  2008   negative  . DIAGNOSTIC LAPAROSCOPY     fibroids  . DILATION AND CURETTAGE OF UTERUS    . DILITATION & CURRETTAGE/HYSTROSCOPY WITH NOVASURE ABLATION N/A 05/06/2013   Procedure: DILATATION & CURETTAGE/HYSTEROSCOPY WITH NOVASURE ABLATION;  Surgeon: Alphonsus SiasKelly A. Ernestina PennaFogleman, MD;  Location: [facility];  Service: Gynecology;  Laterality: N/A;  .  EYE SURGERY     bilateral lasik  . FOOT SURGERY  2017  . LAPAROSCOPIC BILATERAL SALPINGECTOMY Bilateral 05/06/2013   Procedure: LAPAROSCOPIC BILATERAL SALPINGECTOMY;  Surgeon: Tresa EndoKelly A. Ernestina PennaFogleman, MD;  Location: [facility];  Service: Gynecology;  Laterality: Bilateral;  . WISDOM TOOTH EXTRACTION      Family Psychiatric History: See below  Family History:  Family History  Problem Relation Age of Onset  . Alcohol abuse Brother   . Drug abuse Brother   . Depression Mother   . Anxiety disorder Mother   . Anxiety disorder Brother   . Depression Brother   . Bipolar disorder Maternal Uncle   . Anxiety disorder Paternal  Grandfather   . Depression Paternal Grandfather   . Bipolar disorder Cousin     Social History:  Social History   Socioeconomic History  . Marital status: Married    Spouse name: Not on file  . Number of children: Not on file  . Years of education: Not on file  . Highest education level: Not on file  Occupational History  . Not on file  Social Needs  . Financial resource strain: Not on file  . Food insecurity    Worry: Not on file    Inability: Not on file  . Transportation needs    Medical: Not on file    Non-medical: Not on file  Tobacco Use  . Smoking status: Former Smoker    Packs/day: 0.75    Years: 10.00    Pack years: 7.50    Types: Cigarettes    Quit date: 02/08/1988    Years since quitting: 30.5  . Smokeless tobacco: Never Used  Substance and Sexual Activity  . Alcohol use: No  . Drug use: No  . Sexual activity: Yes    Birth control/protection: None  Lifestyle  . Physical activity    Days per week: Not on file    Minutes per session: Not on file  . Stress: Not on file  Relationships  . Social Musicianconnections    Talks on phone: Not on file    Gets together: Not on file    Attends religious service: Not on file    Active member of club or organization: Not on file    Attends meetings of clubs or organizations: Not on file    Relationship status: Not on file  Other Topics Concern  . Not on file  Social History Narrative  . Not on file    Allergies:  Allergies  Allergen Reactions  . Ciprofloxacin Other (See Comments)    Severe muscle and joint pain  . Cefzil [Cefprozil]     possible hives  . Augmentin [Amoxicillin-Pot Clavulanate] Nausea And Vomiting    Throat felt funny and gave pt anxiety  . Bee Venom Swelling    Metabolic Disorder Labs: No results found for: HGBA1C, MPG No results found for: PROLACTIN Lab Results  Component Value Date   CHOL 192 04/02/2015   TRIG 69 04/02/2015   HDL 60 04/02/2015   CHOLHDL 3.2 04/02/2015   VLDL 14  04/02/2015   LDLCALC 118 (H) 04/02/2015   Lab Results  Component Value Date   TSH 1.940 03/23/2015    Therapeutic Level Labs: No results found for: LITHIUM No results found for: VALPROATE No components found for:  CBMZ  Current Medications: Current Outpatient Medications  Medication Sig Dispense Refill  . ALPRAZolam (XANAX) 1 MG tablet Take 1 tablet (1 mg total) by mouth daily as needed for anxiety. 30  tablet 2  . buPROPion (WELLBUTRIN SR) 150 MG 12 hr tablet Take 1 tablet (150 mg total) by mouth 2 (two) times daily. 60 tablet 5  . diazepam (VALIUM) 5 MG tablet TAKE 1 TABLET BY MOUTH EVERY 6 HOURS AS NEEDED. 30 tablet 3  . doxycycline (VIBRA-TABS) 100 MG tablet Take 1 tablet (100 mg total) by mouth 2 (two) times daily. (Patient not taking: Reported on 06/12/2018) 20 tablet 0  . eletriptan (RELPAX) 40 MG tablet TAKE 1 TABLET BY MOUTH AT ONSET OF MIGRAINE, MAY REPEAT ONCE IN 2 HOURS LATER. NO MORE THAN 2 A DAY. 8 tablet 5  . fluconazole (DIFLUCAN) 150 MG tablet Take one po every 3 days. 3 tablet 0  . FLUoxetine (PROZAC) 10 MG capsule Take 1 capsule (10 mg total) by mouth daily. 30 capsule 2  . hydroxychloroquine (PLAQUENIL) 200 MG tablet Take by mouth 2 (two) times daily.    . meclizine (ANTIVERT) 25 MG tablet TAKE ONE TABLET BY MOUTH THREE TIMES DAILY AS NEEDED FOR DIZZINESS. 30 tablet 0  . meclizine (ANTIVERT) 25 MG tablet TAKE (1) TABLET BY MOUTH THREE TIMES DAILY AS NEEDED FOR DIZZINESS. 30 tablet 3  . naltrexone (DEPADE) 50 MG tablet Take 1 tablet (50 mg total) by mouth daily. 30 tablet 2  . NON FORMULARY Taking clabetazol Cream twice a week    . pramipexole (MIRAPEX) 1.5 MG tablet Take 0.5 tablets (0.75 mg total) by mouth at bedtime. 15 tablet 11   No current facility-administered medications for this visit.      Musculoskeletal: Strength & Muscle Tone: within normal limits Gait & Station: normal Patient leans: N/A  Psychiatric Specialty Exam: Review of Systems   Constitutional: Positive for malaise/fatigue.  Musculoskeletal: Positive for joint pain.  Psychiatric/Behavioral: Positive for depression.  All other systems reviewed and are negative.   There were no vitals taken for this visit.There is no height or weight on file to calculate BMI.  General Appearance: Casual and Fairly Groomed  Eye Contact:  Good  Speech:  Clear and Coherent  Volume:  Normal  Mood:  Anxious and Dysphoric  Affect:  Appropriate and Congruent  Thought Process:  Goal Directed  Orientation:  Full (Time, Place, and Person)  Thought Content: Rumination   Suicidal Thoughts:  No  Homicidal Thoughts:  No  Memory:  Immediate;   Good Recent;   Good Remote;   Good  Judgement:  Good  Insight:  Good  Psychomotor Activity:  Decreased  Concentration:  Concentration: Fair and Attention Span: Fair  Recall:  Good  Fund of Knowledge: Good  Language: Good  Akathisia:  No  Handed:  Right  AIMS (if indicated): not done  Assets:  Communication Skills Desire for Improvement Physical Health Resilience Social Support Talents/Skills  ADL's:  Intact  Cognition: WNL  Sleep:  Good   Screenings: GAD-7     Office Visit from 12/28/2017 in [facility]  Total GAD-7 Score  12    PHQ2-9     Office Visit from 12/28/2017 in [facility] Office Visit from 02/03/2017 in [facility]  PHQ-2 Total Score  3  3  PHQ-9 Total Score  9  7       Assessment and Plan: This patient is a 48 year old female with a history of anxiety depression, binge eating disorder.  She states that she was recently diagnosed with rheumatoid arthritis as well.  She is experiencing joint pain and fatigue.  She does not tolerate stimulants well.  She is still very concerned about her binge eating so we will add naltrexone 50 mg along with the Wellbutrin at [place] 150 mg twice daily.  We will also add Prozac 10 mg daily for depression and continue Xanax 1 mg daily as needed  for anxiety.  She will return to see me in 4 weeks   Diannia Rudereborah Dynisha Due, MD 08/03/2018, 9:59 AM

## 2018-08-07 ENCOUNTER — Other Ambulatory Visit: Payer: Self-pay | Admitting: Family Medicine

## 2018-08-07 NOTE — Telephone Encounter (Signed)
Ok plus one ref 

## 2018-09-07 ENCOUNTER — Ambulatory Visit (INDEPENDENT_AMBULATORY_CARE_PROVIDER_SITE_OTHER): Payer: BC Managed Care – PPO | Admitting: Psychiatry

## 2018-09-07 ENCOUNTER — Other Ambulatory Visit: Payer: Self-pay

## 2018-09-07 ENCOUNTER — Encounter (HOSPITAL_COMMUNITY): Payer: Self-pay | Admitting: Psychiatry

## 2018-09-07 DIAGNOSIS — F320 Major depressive disorder, single episode, mild: Secondary | ICD-10-CM | POA: Diagnosis not present

## 2018-09-07 MED ORDER — NALTREXONE HCL 50 MG PO TABS: 50.0000 mg | ORAL_TABLET | Freq: Every day | ORAL | 2 refills | Status: DC

## 2018-09-07 MED ORDER — FLUOXETINE HCL 10 MG PO CAPS: 10.0000 mg | ORAL_CAPSULE | Freq: Every day | ORAL | 2 refills | Status: DC

## 2018-09-07 MED ORDER — BUPROPION HCL ER (SR) 150 MG PO TB12: 150.0000 mg | ORAL_TABLET | Freq: Two times a day (BID) | ORAL | 5 refills | Status: DC

## 2018-09-07 NOTE — Progress Notes (Signed)
Virtual Visit via Telephone Note  I connected with Sheri Wood on 09/07/18 at 10:20 AM EDT by telephone and verified that I am speaking with the correct person using two identifiers.   I discussed the limitations, risks, security and privacy concerns of performing an evaluation and management service by telephone and the availability of in person appointments. I also discussed with the patient that there may be a patient responsible charge related to this service. The patient expressed understanding and agreed to proceed.       I discussed the assessment and treatment plan with the patient. The patient was provided an opportunity to ask questions and all were answered. The patient agreed with the plan and demonstrated an understanding of the instructions.   The patient was advised to call back or seek an in-person evaluation if the symptoms worsen or if the condition fails to improve as anticipated.  I provided 15 minutes of non-face-to-face time during this encounter.   Diannia Rudereborah Rashmi Tallent, MD  [facility] MD/PA/NP OP Progress Note  09/07/2018 10:32 AM Sheri Wood  MRN:  [id number]  Chief Complaint:  Chief Complaint    Depression; Anxiety; Follow-up     HPI: This patient is a 48 year old married white female who lives [place] her [place] in [place].  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  The patient returns for follow-up of depression and binge eating disorder.  She was last seen 4 weeks ago.  At that time she was struggling with her weight.  We had tried Vyvanse for binge eating but it caused heart palpitations.  She has had similar problems with other stimulants.  We did try adding naltrexone which has not really affected her appetite but she states it is helped tremendously with fibromyalgia pain.  This is allowed her to become a little bit more active and in time this may help promote weight loss.  She states adding the fluoxetine is really helped with her  anxiety and mood.  She denies any symptoms of self-harm and also states that she is sleeping much better. Visit Diagnosis:    ICD-10-CM   1. Mild single current episode of major depressive disorder (HCC)  F32.0     Past Psychiatric History: Long-term outpatient treatment for depression and anxiety  Past Medical History:  Past Medical History:  Diagnosis Date  . Allergy    seasonal  . Anxiety   . Depression   . Fibromyalgia   . Gastritis    history  . GERD (gastroesophageal reflux disease)    otc med prn  . IBS (irritable bowel syndrome)   . Migraine   . SVD (spontaneous vaginal delivery)    x 2  . Urticaria, chronic   . Vertigo     Past Surgical History:  Procedure Laterality Date  . COLONOSCOPY  2008   negative  . DIAGNOSTIC LAPAROSCOPY     fibroids  . DILATION AND CURETTAGE OF UTERUS    . DILITATION & CURRETTAGE/HYSTROSCOPY WITH NOVASURE ABLATION N/A 05/06/2013   Procedure: DILATATION & CURETTAGE/HYSTEROSCOPY WITH NOVASURE ABLATION;  Surgeon: Alphonsus SiasKelly A. Ernestina PennaFogleman, MD;  Location: [facility];  Service: Gynecology;  Laterality: N/A;  . EYE SURGERY     bilateral lasik  . FOOT SURGERY  2017  . LAPAROSCOPIC BILATERAL SALPINGECTOMY Bilateral 05/06/2013   Procedure: LAPAROSCOPIC BILATERAL SALPINGECTOMY;  Surgeon: Tresa EndoKelly A. Ernestina PennaFogleman, MD;  Location: [facility];  Service: Gynecology;  Laterality: Bilateral;  . WISDOM TOOTH EXTRACTION      Family  Psychiatric History: see below  Family History:  Family History  Problem Relation Age of Onset  . Alcohol abuse Brother   . Drug abuse Brother   . Depression Mother   . Anxiety disorder Mother   . Anxiety disorder Brother   . Depression Brother   . Bipolar disorder Maternal Uncle   . Anxiety disorder Paternal Grandfather   . Depression Paternal Grandfather   . Bipolar disorder Cousin     Social History:  Social History   Socioeconomic History  . Marital status: Married    Spouse name: Not on file  . Number of children: Not on file   . Years of education: Not on file  . Highest education level: Not on file  Occupational History  . Not on file  Social Needs  . Financial resource strain: Not on file  . Food insecurity    Worry: Not on file    Inability: Not on file  . Transportation needs    Medical: Not on file    Non-medical: Not on file  Tobacco Use  . Smoking status: Former Smoker    Packs/day: 0.75    Years: 10.00    Pack years: 7.50    Types: Cigarettes    Quit date: 02/08/1988    Years since quitting: 30.6  . Smokeless tobacco: Never Used  Substance and Sexual Activity  . Alcohol use: No  . Drug use: No  . Sexual activity: Yes    Birth control/protection: None  Lifestyle  . Physical activity    Days per week: Not on file    Minutes per session: Not on file  . Stress: Not on file  Relationships  . Social Herbalist on phone: Not on file    Gets together: Not on file    Attends religious service: Not on file    Active member of club or organization: Not on file    Attends meetings of clubs or organizations: Not on file    Relationship status: Not on file  Other Topics Concern  . Not on file  Social History Narrative  . Not on file    Allergies:  Allergies  Allergen Reactions  . Ciprofloxacin Other (See Comments)    Severe muscle and joint pain  . Cefzil [Cefprozil]     possible hives  . Augmentin [Amoxicillin-Pot Clavulanate] Nausea And Vomiting    Throat felt funny and gave pt anxiety  . Bee Venom Swelling    Metabolic Disorder Labs: No results found for: HGBA1C, MPG No results found for: PROLACTIN Lab Results  Component Value Date   CHOL 192 04/02/2015   TRIG 69 04/02/2015   HDL 60 04/02/2015   CHOLHDL 3.2 04/02/2015   VLDL 14 04/02/2015   LDLCALC 118 (H) 04/02/2015   Lab Results  Component Value Date   TSH 1.940 03/23/2015    Therapeutic Level Labs: No results found for: LITHIUM No results found for: VALPROATE No components found for:  CBMZ  Current  Medications: Current Outpatient Medications  Medication Sig Dispense Refill  . ALPRAZolam (XANAX) 1 MG tablet Take 1 tablet (1 mg total) by mouth daily as needed for anxiety. 30 tablet 2  . buPROPion (WELLBUTRIN SR) 150 MG 12 hr tablet Take 1 tablet (150 mg total) by mouth 2 (two) times daily. 60 tablet 5  . diazepam (VALIUM) 5 MG tablet TAKE 1 TABLET BY MOUTH EVERY 6 HOURS AS NEEDED. 30 tablet 1  . eletriptan (RELPAX) 40 MG tablet  TAKE 1 TABLET BY MOUTH AT ONSET OF MIGRAINE, MAY REPEAT ONCE IN 2 HOURS LATER. NO MORE THAN 2 A DAY. 8 tablet 5  . fluconazole (DIFLUCAN) 150 MG tablet Take one po every 3 days. 3 tablet 0  . FLUoxetine (PROZAC) 10 MG capsule Take 1 capsule (10 mg total) by mouth daily. 30 capsule 2  . hydroxychloroquine (PLAQUENIL) 200 MG tablet Take by mouth 2 (two) times daily.    . meclizine (ANTIVERT) 25 MG tablet TAKE ONE TABLET BY MOUTH THREE TIMES DAILY AS NEEDED FOR DIZZINESS. 30 tablet 0  . meclizine (ANTIVERT) 25 MG tablet TAKE (1) TABLET BY MOUTH THREE TIMES DAILY AS NEEDED FOR DIZZINESS. 30 tablet 3  . naltrexone (DEPADE) 50 MG tablet Take 1 tablet (50 mg total) by mouth daily. 30 tablet 2  . NON FORMULARY Taking clabetazol Cream twice a week    . pramipexole (MIRAPEX) 1.5 MG tablet Take 0.5 tablets (0.75 mg total) by mouth at bedtime. 15 tablet 11   No current facility-administered medications for this visit.      Musculoskeletal: Strength & Muscle Tone: within normal limits Gait & Station: normal Patient leans: N/A  Psychiatric Specialty Exam: Review of Systems  Musculoskeletal: Positive for myalgias.  All other systems reviewed and are negative.   There were no vitals taken for this visit.There is no height or weight on file to calculate BMI.  General Appearance: NA  Eye Contact:  NA  Speech:  Clear and Coherent  Volume:  Normal  Mood:  Euthymic  Affect:  NA  Thought Process:  Goal Directed  Orientation:  Full (Time, Place, and Person)  Thought  Content: WDL   Suicidal Thoughts:  No  Homicidal Thoughts:  No  Memory:  Immediate;   Good Recent;   Good Remote;   Fair  Judgement:  Good  Insight:  Good  Psychomotor Activity:  Normal  Concentration:  Concentration: Good and Attention Span: Good  Recall:  Good  Fund of Knowledge: Good  Language: Good  Akathisia:  No  Handed:  Right  AIMS (if indicated): not done  Assets:  Communication Skills Desire for Improvement Physical Health Resilience Social Support Talents/Skills  ADL's:  Intact  Cognition: WNL  Sleep:  Good   Screenings: GAD-7     Office Visit from 12/28/2017 in [facility]  Total GAD-7 Score  12    PHQ2-9     Office Visit from 12/28/2017 in [facility] Office Visit from 02/03/2017 in [facility]  PHQ-2 Total Score  3  3  PHQ-9 Total Score  9  7       Assessment and Plan: This patient is a 48 year old female with a history of fibromyalgia depression anxiety and binge eating disorder.  She is doing much better on her current regimen.  She will continue Wellbutrin SR 150 mg twice daily as well as Prozac 10 mg daily for depression and anxiety and naltrexone for binge eating disorder.  She occasionally uses Xanax 1 mg but this is quite rare.  She will return to see me in 3 months   Diannia Rudereborah Rebekkah Powless, MD 09/07/2018, 10:32 AM

## 2018-10-11 ENCOUNTER — Ambulatory Visit (INDEPENDENT_AMBULATORY_CARE_PROVIDER_SITE_OTHER): Payer: BC Managed Care – PPO | Admitting: Family Medicine

## 2018-10-11 ENCOUNTER — Other Ambulatory Visit: Payer: Self-pay

## 2018-10-11 ENCOUNTER — Encounter: Payer: Self-pay | Admitting: Family Medicine

## 2018-10-11 DIAGNOSIS — R21 Rash and other nonspecific skin eruption: Secondary | ICD-10-CM | POA: Diagnosis not present

## 2018-10-11 MED ORDER — PREDNISONE 20 MG PO TABS: ORAL_TABLET | ORAL | 0 refills | Status: DC

## 2018-10-11 MED ORDER — FAMOTIDINE 40 MG PO TABS: 40.0000 mg | ORAL_TABLET | Freq: Every day | ORAL | 0 refills | Status: DC

## 2018-10-11 MED ORDER — HYDROXYZINE HCL 25 MG PO TABS: 25.0000 mg | ORAL_TABLET | Freq: Three times a day (TID) | ORAL | 2 refills | Status: DC | PRN

## 2018-10-11 NOTE — Progress Notes (Signed)
   Subjective:  Audio only  Patient ID: Sheri Wood, female    DOB: 03/14/1970, 48 y.o.   MRN: [id number]  Urticaria This is a new problem. Episode onset: one week. Location: top of head, palm of hands, knees, top of feet, torso. The rash is characterized by itchiness. She was exposed to nothing. Treatments tried: benadryl, hydroxyzine.   Virtual Visit via Telephone Note  I connected with Sheri Wood on 10/11/18 at  9:30 AM EDT by telephone and verified that I am speaking with the correct person using two identifiers.  Location: Patient: home Provider: office   I discussed the limitations, risks, security and privacy concerns of performing an evaluation and management service by telephone and the availability of in person appointments. I also discussed with the patient that there may be a patient responsible charge related to this service. The patient expressed understanding and agreed to proceed.   History of Present Illness:    Observations/Objective:   Assessment and Plan:   Follow Up Instructions:    I discussed the assessment and treatment plan with the patient. The patient was provided an opportunity to ask questions and all were answered. The patient agreed with the plan and demonstrated an understanding of the instructions.   The patient was advised to call back or seek an in-person evaluation if the symptoms worsen or if the condition fails to improve as anticipated.  I provided 18 minutes of non-face-to-face time during this encounter.  No obvious triggers   Patient has had similar flares in the past.  Uncertain of cause.  No new foods.  No new medications.  Does report some increased stress of late  Very pruritic rash.  Comment: Patches true hives noted.   Review of Systems No wheezing or shortness of breath no chest pain    Objective:   Physical Exam    Alert virtual    Assessment & Plan:  Impression urticaria discussion held.  We will do  prednisone taper.  Also H2 blocker for the next 30 days.  PRN hydroxyzine warning signs discussed

## 2018-10-16 ENCOUNTER — Encounter: Payer: Self-pay | Admitting: Family Medicine

## 2018-10-16 ENCOUNTER — Ambulatory Visit (INDEPENDENT_AMBULATORY_CARE_PROVIDER_SITE_OTHER): Payer: BC Managed Care – PPO | Admitting: Family Medicine

## 2018-10-16 ENCOUNTER — Other Ambulatory Visit: Payer: Self-pay

## 2018-10-16 ENCOUNTER — Telehealth: Payer: BC Managed Care – PPO | Admitting: Physician Assistant

## 2018-10-16 DIAGNOSIS — R21 Rash and other nonspecific skin eruption: Secondary | ICD-10-CM | POA: Diagnosis not present

## 2018-10-16 DIAGNOSIS — R399 Unspecified symptoms and signs involving the genitourinary system: Secondary | ICD-10-CM

## 2018-10-16 MED ORDER — SULFAMETHOXAZOLE-TRIMETHOPRIM 800-160 MG PO TABS: 1.0000 | ORAL_TABLET | Freq: Two times a day (BID) | ORAL | 0 refills | Status: DC

## 2018-10-16 NOTE — Progress Notes (Signed)
Based on what you shared with me, I feel your condition warrants further evaluation and I recommend that you be seen for a face to face office visit.  Urinary tract infection symptoms with abdominal pain and back pain could be a sign of a more serious infection such as a kidney infection. In order to appropriately treat this, it will require a urine culture so that we can determine exactly what type of bacteria is causing the illness. I would recommend seeking immediate evaluation today from your primary care provider or an urgent care.   NOTE: If you entered your credit card information for this eVisit, you will not be charged. You may see a "hold" on your card for the $35 but that hold will drop off and you will not have a charge processed.  If you are having a true medical emergency please call 911.     For an urgent face to face visit, [facility] has four urgent care centers for your convenience:   . [facility]    [phone]                  Get Driving Directions  [address], [place] [address] . 10 am to 8 pm Monday-Friday . 12 pm to 8 pm Saturday-Sunday   . [facility] [place]                  Get Driving Directions  [address], [place], [place] [address] . 8 am to 8 pm Monday-Friday . 9 am to 6 pm Saturday . 11 am to 6 pm Sunday   . [facility] [place]                  Get Driving Directions   [address], [place] [address] . 8 am to 8 pm Monday-Friday . 8 am to 4 pm Saturday-Sunday    . [facility] at [place]                    Get Driving Directions  [phone]  [address] [place], [place] [address]  . Monday-Friday, 12 PM to 6 PM    Your e-visit answers were reviewed by a board certified advanced clinical practitioner to complete your personal care plan.  Thank you for using  e-Visits.  Approximately 5 minutes of time have been spent researching, coordinating, and implementing care for this patient today.

## 2018-10-16 NOTE — Progress Notes (Signed)
   Subjective:  Audio  Patient ID: Sheri Wood, female    DOB: 1971/01/29, 48 y.o.   MRN: [id number]  Dysuria  This is a new problem. The current episode started in the past 7 days.      Review of Systems  Genitourinary: Positive for dysuria.   Virtual Visit via Video Note  I connected with Sheri Wood on 10/16/18 at  4:10 PM EDT by a video enabled telemedicine application and verified that I am speaking with the correct person using two identifiers.  Location: Patient: home Provider: office   I discussed the limitations of evaluation and management by telemedicine and the availability of in person appointments. The patient expressed understanding and agreed to proceed.  History of Present Illness:    Observations/Objective:   Assessment and Plan:   Follow Up Instructions:    I discussed the assessment and treatment plan with the patient. The patient was provided an opportunity to ask questions and all were answered. The patient agreed with the plan and demonstrated an understanding of the instructions.   The patient was advised to call back or seek an in-person evaluation if the symptoms worsen or if the condition fails to improve as anticipated.  I provided 15 minutes of non-face-to-face time during this encounter.  Increased urinary frequency in recent days.  Also positive dysuria.  No hematuria no fever no chills.  Increase sense of urgency.  History of frequent UTIs  In the past UTIs were resistant Bactrim DS was last antibiotic dose and it was successful      Objective:   Physical Exam  Virtual      Assessment & Plan:  Impression urinary tract infection.  With history of recurrence.  Bactrim DS twice daily.  Stretched out a bit due to resistance symptom care discussed warning signs discussed  Urticaria.  Clinically substantially improved nearly gone  Greater than 50% of this 15 minute face to face visit was spent in counseling and  discussion and coordination of care regarding the above diagnosis/diagnosies

## 2018-10-26 ENCOUNTER — Other Ambulatory Visit: Payer: Self-pay | Admitting: Family Medicine

## 2018-10-30 ENCOUNTER — Telehealth: Payer: BC Managed Care – PPO | Admitting: Physician Assistant

## 2018-10-30 DIAGNOSIS — J019 Acute sinusitis, unspecified: Secondary | ICD-10-CM

## 2018-10-30 MED ORDER — DOXYCYCLINE HYCLATE 100 MG PO TABS: 100.0000 mg | ORAL_TABLET | Freq: Two times a day (BID) | ORAL | 0 refills | Status: DC

## 2018-10-30 NOTE — Progress Notes (Signed)
We are sorry that you are not feeling well.  Here is how we plan to help!  Based on what you have shared with me it looks like you have sinusitis.  Sinusitis is inflammation and infection in the sinus cavities of the head.  Based on your presentation I believe you most likely have Acute Bacterial Sinusitis.  This is an infection caused by bacteria and is treated with antibiotics. I have prescribed Doxycycline 100mg  by mouth twice a day for 10 days. You may use an oral decongestant such as Mucinex D or if you have glaucoma or high blood pressure use plain Mucinex. Saline nasal spray help and can safely be used as often as needed for congestion. Continue using Flonase and Allegra.  If you develop worsening sinus pain, fever or notice severe headache and vision changes, or if symptoms are not better after completion of antibiotic, please schedule an appointment with a health care provider.    Sinus infections are not as easily transmitted as other respiratory infection, however we still recommend that you avoid close contact with loved ones, especially the very young and elderly.  Remember to wash your hands thoroughly throughout the day as this is the number one way to prevent the spread of infection!  Home Care:  Only take medications as instructed by your medical team.  Complete the entire course of an antibiotic.  Do not take these medications with alcohol.  A steam or ultrasonic humidifier can help congestion.  You can place a towel over your head and breathe in the steam from hot water coming from a faucet.  Avoid close contacts especially the very young and the elderly.  Cover your mouth when you cough or sneeze.  Always remember to wash your hands.  Get Help Right Away If:  You develop worsening fever or sinus pain.  You develop a severe head ache or visual changes.  Your symptoms persist after you have completed your treatment plan.  Make sure you  Understand these  instructions.  Will watch your condition.  Will get help right away if you are not doing well or get worse.  Your e-visit answers were reviewed by a board certified advanced clinical practitioner to complete your personal care plan.  Depending on the condition, your plan could have included both over the counter or prescription medications.  If there is a problem please reply  once you have received a response from your provider.  Your safety is important to [place].  If you have drug allergies check your prescription carefully.    You can use MyChart to ask questions about today's visit, request a non-urgent call back, or ask for a work or school excuse for 24 hours related to this e-Visit. If it has been greater than 24 hours you will need to follow up with your provider, or enter a new e-Visit to address those concerns.  You will get an e-mail in the next two days asking about your experience.  I hope that your e-visit has been valuable and will speed your recovery. Thank you for using e-visits.  Greater than 5 minutes, yet less than 10 minutes of time have been spent researching, coordinating, and implementing care for this patient today.

## 2018-12-04 ENCOUNTER — Encounter: Payer: Self-pay | Admitting: Family Medicine

## 2018-12-04 MED ORDER — TERCONAZOLE 0.4 % VA CREA: 1.0000 | TOPICAL_CREAM | Freq: Every day | VAGINAL | 2 refills | Status: DC

## 2018-12-10 ENCOUNTER — Telehealth: Payer: BC Managed Care – PPO | Admitting: Physician Assistant

## 2018-12-10 DIAGNOSIS — R399 Unspecified symptoms and signs involving the genitourinary system: Secondary | ICD-10-CM | POA: Diagnosis not present

## 2018-12-10 MED ORDER — NITROFURANTOIN MONOHYD MACRO 100 MG PO CAPS: 100.0000 mg | ORAL_CAPSULE | Freq: Two times a day (BID) | ORAL | 0 refills | Status: AC

## 2018-12-10 NOTE — Progress Notes (Signed)

## 2018-12-19 ENCOUNTER — Other Ambulatory Visit: Payer: Self-pay

## 2018-12-19 ENCOUNTER — Ambulatory Visit (INDEPENDENT_AMBULATORY_CARE_PROVIDER_SITE_OTHER): Payer: BC Managed Care – PPO | Admitting: Family Medicine

## 2018-12-19 DIAGNOSIS — R399 Unspecified symptoms and signs involving the genitourinary system: Secondary | ICD-10-CM | POA: Diagnosis not present

## 2018-12-19 MED ORDER — SULFAMETHOXAZOLE-TRIMETHOPRIM 800-160 MG PO TABS: 1.0000 | ORAL_TABLET | Freq: Two times a day (BID) | ORAL | 0 refills | Status: DC

## 2018-12-19 MED ORDER — TERCONAZOLE 0.4 % VA CREA: 1.0000 | TOPICAL_CREAM | Freq: Every day | VAGINAL | 2 refills | Status: DC

## 2018-12-19 NOTE — Progress Notes (Signed)
   Subjective:  Audio  Patient ID: Sheri Wood, female    DOB: September 25, 1970, 48 y.o.   MRN: [id number]  HPI  Patient calls with dysuria for several days. Patient states she had e visit on 12/10/2018 and was given Macrobid but the dysuria has not went away.   Virtual Visit via Video Note  I connected with Sheri Wood on 12/19/18 at  3:00 PM EST by a video enabled telemedicine application and verified that I am speaking with the correct person using two identifiers.  Location: Patient: home Provider: office   I discussed the limitations of evaluation and management by telemedicine and the availability of in person appointments. The patient expressed understanding and agreed to proceed.  History of Present Illness:    Observations/Objective:   Assessment and Plan:   Follow Up Instructions:    I discussed the assessment and treatment plan with the patient. The patient was provided an opportunity to ask questions and all were answered. The patient agreed with the plan and demonstrated an understanding of the instructions.   The patient was advised to call back or seek an in-person evaluation if the symptoms worsen or if the condition fails to improve as anticipated.  I provided 15 minutes of non-face-to-face time during this encounter.  Much of the time with this patient was reviewing all the visits that she has had elsewhere in recent months along with visits here and review of all cultures and sensitivities in the past  Notes increased urinary frequency some dysuria.  Several days duration.  No fever no chills no symptoms that would suggest pyelonephritis  Also frequently gets yeast infections would like to have a refill on those medications on hand    Review of Systems No headache no chest pain no fever    Objective:   Physical Exam  Virtual      Assessment & Plan:  Impression probable recurrent UTI.  Discussed.  Initiate Bactrim DS twice daily.   Symptom care discussed warning signs discussed.  Also Terazol 7.  Bactrim should cover patient is not sensitive to this particular antibiotic pending urine cultures have revealed prior infections which have responded to this

## 2018-12-20 ENCOUNTER — Encounter: Payer: Self-pay | Admitting: Family Medicine

## 2018-12-31 ENCOUNTER — Ambulatory Visit (INDEPENDENT_AMBULATORY_CARE_PROVIDER_SITE_OTHER): Payer: BC Managed Care – PPO | Admitting: Psychiatry

## 2018-12-31 ENCOUNTER — Telehealth (HOSPITAL_COMMUNITY): Payer: Self-pay

## 2018-12-31 ENCOUNTER — Other Ambulatory Visit: Payer: Self-pay

## 2018-12-31 ENCOUNTER — Encounter (HOSPITAL_COMMUNITY): Payer: Self-pay | Admitting: Psychiatry

## 2018-12-31 DIAGNOSIS — F320 Major depressive disorder, single episode, mild: Secondary | ICD-10-CM

## 2018-12-31 MED ORDER — BUPROPION HCL ER (SR) 150 MG PO TB12: 150.0000 mg | ORAL_TABLET | Freq: Two times a day (BID) | ORAL | 5 refills | Status: DC

## 2018-12-31 MED ORDER — AMPHETAMINE-DEXTROAMPHETAMINE 10 MG PO TABS: 10.0000 mg | ORAL_TABLET | Freq: Two times a day (BID) | ORAL | 0 refills | Status: DC

## 2018-12-31 NOTE — Telephone Encounter (Signed)
Thank you :)

## 2018-12-31 NOTE — Progress Notes (Signed)
Virtual Visit via Video Note  I connected with Sheri Wood on 12/31/18 at  8:40 AM EST by a video enabled telemedicine application and verified that I am speaking with the correct person using two identifiers.   I discussed the limitations of evaluation and management by telemedicine and the availability of in person appointments. The patient expressed understanding and agreed to proceed.    I discussed the assessment and treatment plan with the patient. The patient was provided an opportunity to ask questions and all were answered. The patient agreed with the plan and demonstrated an understanding of the instructions.   The patient was advised to call back or seek an in-person evaluation if the symptoms worsen or if the condition fails to improve as anticipated.  I provided 15 minutes of non-face-to-face time during this encounter.   Diannia Ruder, MD  [facility] MD/PA/NP OP Progress Note  12/31/2018 8:58 AM Sheri Wood  MRN:  [id number]  Chief Complaint:  Chief Complaint    Eating Disorder; ADHD; Follow-up     HPI: This patient is a 48 year old married white female who lives with her husband in [place].  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  The patient returns for follow-up of depression, ADD and binge eating disorder.  She was last seen about 4 months ago.  She states that her binge eating is getting worse.  She is getting to the point of making herself physically sick because she is overeating so much.  She also seems to have trouble focusing during the day.  We have tried Vyvanse in the past which did work but caused palpitations.  She has tried phentermine from primary care which helped as well.  She has not tried other stimulants like suggested we start with a low dose of Adderall.  I also strongly suggested she get back into cognitive therapy which should help with this as well.  She states that she feels depressed because of the eating and she  also still misses her mother who died almost 2 years ago.  She is functioning well at work and denies suicidal ideation.  She was on naltrexone for a while along with the Wellbutrin but it caused her to feel nauseated and sick. Visit Diagnosis:    ICD-10-CM   1. Mild single current episode of major depressive disorder (HCC)  F32.0     Past Psychiatric History: Long-term outpatient treatment for depression and anxiety  Past Medical History:  Past Medical History:  Diagnosis Date  . Allergy    seasonal  . Anxiety   . Depression   . Fibromyalgia   . Gastritis    history  . GERD (gastroesophageal reflux disease)    otc med prn  . IBS (irritable bowel syndrome)   . Migraine   . SVD (spontaneous vaginal delivery)    x 2  . Urticaria, chronic   . Vertigo     Past Surgical History:  Procedure Laterality Date  . COLONOSCOPY  2008   negative  . DIAGNOSTIC LAPAROSCOPY     fibroids  . DILATION AND CURETTAGE OF UTERUS    . DILITATION & CURRETTAGE/HYSTROSCOPY WITH NOVASURE ABLATION N/A 05/06/2013   Procedure: DILATATION & CURETTAGE/HYSTEROSCOPY WITH NOVASURE ABLATION;  Surgeon: Alphonsus Sias. Ernestina Penna, MD;  Location: [facility];  Service: Gynecology;  Laterality: N/A;  . EYE SURGERY     bilateral lasik  . FOOT SURGERY  2017  . LAPAROSCOPIC BILATERAL SALPINGECTOMY Bilateral 05/06/2013   Procedure: LAPAROSCOPIC  BILATERAL SALPINGECTOMY;  Surgeon: Tresa EndoKelly A. Ernestina PennaFogleman, MD;  Location: [facility];  Service: Gynecology;  Laterality: Bilateral;  . WISDOM TOOTH EXTRACTION      Family Psychiatric History: see below  Family History:  Family History  Problem Relation Age of Onset  . Alcohol abuse Brother   . Drug abuse Brother   . Depression Mother   . Anxiety disorder Mother   . Anxiety disorder Brother   . Depression Brother   . Bipolar disorder Maternal Uncle   . Anxiety disorder Paternal Grandfather   . Depression Paternal Grandfather   . Bipolar disorder Cousin     Social History:  Social  History   Socioeconomic History  . Marital status: Married    Spouse name: Not on file  . Number of children: Not on file  . Years of education: Not on file  . Highest education level: Not on file  Occupational History  . Not on file  Social Needs  . Financial resource strain: Not on file  . Food insecurity    Worry: Not on file    Inability: Not on file  . Transportation needs    Medical: Not on file    Non-medical: Not on file  Tobacco Use  . Smoking status: Former Smoker    Packs/day: 0.75    Years: 10.00    Pack years: 7.50    Types: Cigarettes    Quit date: 02/08/1988    Years since quitting: 30.9  . Smokeless tobacco: Never Used  Substance and Sexual Activity  . Alcohol use: No  . Drug use: No  . Sexual activity: Yes    Birth control/protection: None  Lifestyle  . Physical activity    Days per week: Not on file    Minutes per session: Not on file  . Stress: Not on file  Relationships  . Social Musicianconnections    Talks on phone: Not on file    Gets together: Not on file    Attends religious service: Not on file    Active member of club or organization: Not on file    Attends meetings of clubs or organizations: Not on file    Relationship status: Not on file  Other Topics Concern  . Not on file  Social History Narrative  . Not on file    Allergies:  Allergies  Allergen Reactions  . Ciprofloxacin Other (See Comments)    Severe muscle and joint pain  . Cefzil [Cefprozil]     possible hives  . Augmentin [Amoxicillin-Pot Clavulanate] Nausea And Vomiting    Throat felt funny and gave pt anxiety  . Bee Venom Swelling    Metabolic Disorder Labs: No results found for: HGBA1C, MPG No results found for: PROLACTIN Lab Results  Component Value Date   CHOL 192 04/02/2015   TRIG 69 04/02/2015   HDL 60 04/02/2015   CHOLHDL 3.2 04/02/2015   VLDL 14 04/02/2015   LDLCALC 118 (H) 04/02/2015   Lab Results  Component Value Date   TSH 1.940 03/23/2015     Therapeutic Level Labs: No results found for: LITHIUM No results found for: VALPROATE No components found for:  CBMZ  Current Medications: Current Outpatient Medications  Medication Sig Dispense Refill  . ALPRAZolam (XANAX) 1 MG tablet Take 1 tablet (1 mg total) by mouth daily as needed for anxiety. 30 tablet 2  . amphetamine-dextroamphetamine (ADDERALL) 10 MG tablet Take 1 tablet (10 mg total) by mouth 2 (two) times daily with a meal. 60  tablet 0  . buPROPion (WELLBUTRIN SR) 150 MG 12 hr tablet Take 1 tablet (150 mg total) by mouth 2 (two) times daily. 60 tablet 5  . diazepam (VALIUM) 5 MG tablet TAKE 1 TABLET BY MOUTH EVERY 6 HOURS AS NEEDED. 30 tablet 1  . doxycycline (VIBRA-TABS) 100 MG tablet Take 1 tablet (100 mg total) by mouth 2 (two) times daily. 20 tablet 0  . eletriptan (RELPAX) 40 MG tablet TAKE 1 TABLET BY MOUTH AT ONSET OF MIGRAINE, MAY REPEAT ONCE IN 2 HOURS LATER. NO MORE THAN 2 A DAY. 8 tablet 6  . famotidine (PEPCID) 40 MG tablet Take 1 tablet (40 mg total) by mouth daily. 30 tablet 0  . hydroxychloroquine (PLAQUENIL) 200 MG tablet Take by mouth 2 (two) times daily.    . hydrOXYzine (ATARAX/VISTARIL) 25 MG tablet Take 1 tablet (25 mg total) by mouth 3 (three) times daily as needed for itching. 30 tablet 2  . meclizine (ANTIVERT) 25 MG tablet TAKE (1) TABLET BY MOUTH THREE TIMES DAILY AS NEEDED FOR DIZZINESS. 30 tablet 3  . naltrexone (DEPADE) 50 MG tablet Take 1 tablet (50 mg total) by mouth daily. 30 tablet 2  . NON FORMULARY Taking clabetazol Cream twice a week    . pramipexole (MIRAPEX) 1.5 MG tablet Take 0.5 tablets (0.75 mg total) by mouth at bedtime. 15 tablet 11  . sulfamethoxazole-trimethoprim (BACTRIM DS) 800-160 MG tablet Take 1 tablet by mouth 2 (two) times daily. 14 tablet 0  . terconazole (TERAZOL 7) 0.4 % vaginal cream Place 1 applicator vaginally at bedtime. 45 g 2   No current facility-administered medications for this visit.       Musculoskeletal: Strength & Muscle Tone: within normal limits Gait & Station: normal Patient leans: N/A  Psychiatric Specialty Exam: Review of Systems  Musculoskeletal: Positive for joint pain.  Psychiatric/Behavioral: Positive for depression. The patient is nervous/anxious.   All other systems reviewed and are negative.   There were no vitals taken for this visit.There is no height or weight on file to calculate BMI.  General Appearance: Casual, Neat and Well Groomed  Eye Contact:  Good  Speech:  Clear and Coherent  Volume:  Normal  Mood:  Dysphoric  Affect:  Appropriate and Congruent  Thought Process:  Goal Directed  Orientation:  Full (Time, Place, and Person)  Thought Content: Rumination   Suicidal Thoughts:  No  Homicidal Thoughts:  No  Memory:  Immediate;   Good Recent;   Good Remote;   Good  Judgement:  Good  Insight:  Good  Psychomotor Activity:  Normal  Concentration:  Concentration: Fair and Attention Span: Fair  Recall:  Good  Fund of Knowledge: Good  Language: Good  Akathisia:  No  Handed:  Right  AIMS (if indicated): not done  Assets:  Communication Skills Desire for Improvement Physical Health Resilience Social Support Talents/Skills  ADL's:  Intact  Cognition: WNL  Sleep:  Good   Screenings: GAD-7     Office Visit from 12/28/2017 in [place]  Total GAD-7 Score  12    PHQ2-9     Office Visit from 12/28/2017 in [place] Office Visit from 02/03/2017 in [place]  PHQ-2 Total Score  3  3  PHQ-9 Total Score  9  7       Assessment and Plan: This patient is a 48 year old female with a history of fibromyalgia depression anxiety ADD and binge eating disorder.  She has feels like she is  slipping backwards and gaining more weight and constantly eating.  For this reason we will try another stimulant namely Adderall 10 mg twice daily to start out with.  She will continue Wellbutrin SR 150 mg  twice daily for depression.  She occasionally uses Xanax 1 mg for anxiety.  She will return to see me in 4 weeks and we will also have her rescheduled with Florencia Reasons for therapy   Diannia Ruder, MD 12/31/2018, 8:58 AM

## 2018-12-31 NOTE — Telephone Encounter (Signed)
I received a fax requesting a prior authorization for this patient's Amphetamine-dextroamphetamine 10mg . She has an ICD-10 code for her MDD but not for the ADD. In order for the pa to get approved for the Adderall, the patient has to have an ADD or ADHD code I can use. Thank you.

## 2018-12-31 NOTE — Telephone Encounter (Signed)
F90.0

## 2019-01-01 ENCOUNTER — Telehealth (HOSPITAL_COMMUNITY): Payer: Self-pay

## 2019-01-01 NOTE — Telephone Encounter (Signed)
[facility] PRESCRIPTION COVERAGE APPROVED   AMPHETAMINE-DEXTROAMPHETAMINE 10MG  TABLET PA# [id number] EFFECTIVE 01/01/2019 TO 12/31/2021

## 2019-01-17 ENCOUNTER — Telehealth: Payer: Self-pay | Admitting: Family Medicine

## 2019-01-17 ENCOUNTER — Other Ambulatory Visit: Payer: Self-pay | Admitting: Family Medicine

## 2019-01-17 MED ORDER — PRAMIPEXOLE DIHYDROCHLORIDE 1.5 MG PO TABS: 0.7500 mg | ORAL_TABLET | Freq: Every day | ORAL | 6 refills | Status: DC

## 2019-01-17 NOTE — Telephone Encounter (Addendum)
Patient notified via phone .

## 2019-01-17 NOTE — Telephone Encounter (Signed)
Refills were sent in, notify patient by call or MyChart

## 2019-01-17 NOTE — Telephone Encounter (Signed)
Patient states has been trying for three days to get medication from pharmacy and she is completely out and needing refill on pramipexole 1.5 mg  Has medication follow up on 12/14. [employer]. [phone]

## 2019-01-21 ENCOUNTER — Other Ambulatory Visit: Payer: Self-pay

## 2019-01-21 ENCOUNTER — Ambulatory Visit (INDEPENDENT_AMBULATORY_CARE_PROVIDER_SITE_OTHER): Payer: BC Managed Care – PPO | Admitting: Family Medicine

## 2019-01-21 ENCOUNTER — Encounter: Payer: Self-pay | Admitting: Family Medicine

## 2019-01-21 DIAGNOSIS — G2581 Restless legs syndrome: Secondary | ICD-10-CM | POA: Diagnosis not present

## 2019-01-21 MED ORDER — DIAZEPAM 5 MG PO TABS: 5.0000 mg | ORAL_TABLET | Freq: Four times a day (QID) | ORAL | 1 refills | Status: DC | PRN

## 2019-01-21 MED ORDER — PRAMIPEXOLE DIHYDROCHLORIDE 1.5 MG PO TABS: 1.5000 mg | ORAL_TABLET | Freq: Every day | ORAL | 5 refills | Status: DC

## 2019-01-21 NOTE — Progress Notes (Signed)
   Subjective:  Audio only  Patient ID: Sheri Wood, female    DOB: 17-Nov-1970, 48 y.o.   MRN: [id number]  HPImed check up.  Pt states mirapex is not helping her restless legs anymore.  Virtual Visit via Telephone Note  I connected with Sheri Wood on 01/21/19 at  1:10 PM EST by telephone and verified that I am speaking with the correct person using two identifiers.  Location: Patient: home Provider: office   I discussed the limitations, risks, security and privacy concerns of performing an evaluation and management service by telephone and the availability of in person appointments. I also discussed with the patient that there may be a patient responsible charge related to this service. The patient expressed understanding and agreed to proceed.   History of Present Illness:    Observations/Objective:   Assessment and Plan:   Follow Up Instructions:    I discussed the assessment and treatment plan with the patient. The patient was provided an opportunity to ask questions and all were answered. The patient agreed with the plan and demonstrated an understanding of the instructions.   The patient was advised to call back or seek an in-person evaluation if the symptoms worsen or if the condition fails to improve as anticipated.  I provided 15 minutes of non-face-to-face time during this encounter.   Patient understandably frustrated because Mirapex no longer working.  See prior notes.  Was on .75 nightly.  She increased to three quarters of a 1.50dose Still experiencing substantial difficulties with restless leg syndrome definitely affecting her quality of life.  Is on other medications for mental health purposes.  Rarely takes benzodiazepine for dizziness or Xanax for anxiety  Exercise A fair amount These days  Review of Systems No headache, no major weight loss or weight gain, no chest pain no back pain abdominal pain no change in bowel habits complete ROS  otherwise negative     Objective:   Physical Exam   Virtual     Assessment & Plan:  Impression restless leg syndrome.  Options discussed.  We will go ahead and increase to full 1.5 mg nightly rationale discussed.  Also of note patient's migraine headaches stable.  Medications refilled.

## 2019-02-04 ENCOUNTER — Ambulatory Visit (HOSPITAL_COMMUNITY): Payer: BC Managed Care – PPO | Admitting: Psychiatry

## 2019-02-04 ENCOUNTER — Other Ambulatory Visit: Payer: Self-pay

## 2019-02-04 ENCOUNTER — Telehealth (HOSPITAL_COMMUNITY): Payer: Self-pay | Admitting: Psychiatry

## 2019-02-13 ENCOUNTER — Ambulatory Visit (INDEPENDENT_AMBULATORY_CARE_PROVIDER_SITE_OTHER): Payer: BC Managed Care – PPO | Admitting: Family Medicine

## 2019-02-13 ENCOUNTER — Other Ambulatory Visit: Payer: Self-pay

## 2019-02-13 ENCOUNTER — Ambulatory Visit: Payer: BC Managed Care – PPO | Attending: Internal Medicine

## 2019-02-13 DIAGNOSIS — Z20822 Contact with and (suspected) exposure to covid-19: Secondary | ICD-10-CM

## 2019-02-13 DIAGNOSIS — J019 Acute sinusitis, unspecified: Secondary | ICD-10-CM | POA: Diagnosis not present

## 2019-02-13 MED ORDER — DOXYCYCLINE HYCLATE 100 MG PO TABS: 100.0000 mg | ORAL_TABLET | Freq: Two times a day (BID) | ORAL | 0 refills | Status: DC

## 2019-02-13 NOTE — Progress Notes (Signed)
   Subjective:    Patient ID: Sheri Wood, female    DOB: March 06, 1970, 50 y.o.   MRN: [id number]  Pt had covid test today at lunch time.   Sinusitis This is a new problem. The current episode started yesterday. Associated symptoms include ear pain and sinus pressure. Past treatments include acetaminophen (mucinex).   Virtual Visit via Telephone Note  I connected with Sheri Wood on 02/13/19 at  2:00 PM EST by telephone and verified that I am speaking with the correct person using two identifiers.  Location: Patient: home Provider: office   I discussed the limitations, risks, security and privacy concerns of performing an evaluation and management service by telephone and the availability of in person appointments. I also discussed with the patient that there may be a patient responsible charge related to this service. The patient expressed understanding and agreed to proceed.   History of Present Illness:    Observations/Objective:   Assessment and Plan:   Follow Up Instructions:    I discussed the assessment and treatment plan with the patient. The patient was provided an opportunity to ask questions and all were answered. The patient agreed with the plan and demonstrated an understanding of the instructions.   The patient was advised to call back or seek an in-person evaluation if the symptoms worsen or if the condition fails to improve as anticipated.  I provided 18 minutes of non-face-to-face time during this encounter.  Lots of drainage  Some ha   Sore throat tod   feeeling achey   Son home for christmas    Review of Systems  HENT: Positive for ear pain and sinus pressure.        Objective:   Physical Exam  Virtual     Assessment & Plan:  Impression probable rhinosinusitis.  Discussed.  Potential for COVID-19 also discussed with multiple questions answered.  Nature of isolation discussed.  Antibiotics prescribed.  Questions answered.   Await test results of note patient did have exposure to COVID-19 through positive son who visited during Christmas

## 2019-02-14 ENCOUNTER — Other Ambulatory Visit (HOSPITAL_COMMUNITY): Payer: Self-pay | Admitting: Psychiatry

## 2019-02-14 ENCOUNTER — Telehealth: Payer: Self-pay | Admitting: Family Medicine

## 2019-02-14 ENCOUNTER — Encounter: Payer: Self-pay | Admitting: Family Medicine

## 2019-02-14 ENCOUNTER — Telehealth (HOSPITAL_COMMUNITY): Payer: Self-pay | Admitting: *Deleted

## 2019-02-14 LAB — NOVEL CORONAVIRUS, NAA: SARS-CoV-2, NAA: NOT DETECTED

## 2019-02-14 MED ORDER — AMPHETAMINE-DEXTROAMPHETAMINE 10 MG PO TABS: 10.0000 mg | ORAL_TABLET | Freq: Two times a day (BID) | ORAL | 0 refills | Status: DC

## 2019-02-14 NOTE — Telephone Encounter (Signed)
sent 

## 2019-02-14 NOTE — Telephone Encounter (Signed)
Pt would like work note. She had virtual yesterday and is being tested for covid. How long should she be out of work? She also wants note to state she is being tested for COVID.

## 2019-02-14 NOTE — Telephone Encounter (Signed)
Please advise. Thank you

## 2019-02-14 NOTE — Telephone Encounter (Signed)
Called and discussed with pt. Please give work excuse through [employer]

## 2019-02-14 NOTE — Telephone Encounter (Signed)
PATIENT CALLED TO SCHEDULED MISSED APPT FROM  02/04/19. NEXT APPT 02/25/19 PATIENT HAS REQUESTED REFILL ON ADDERALL UNTIL APPT

## 2019-02-14 NOTE — Telephone Encounter (Signed)
W e til mon jan 18, I think she has it, pos exposure and significant symtoms

## 2019-02-15 ENCOUNTER — Encounter: Payer: Self-pay | Admitting: Family Medicine

## 2019-02-15 ENCOUNTER — Telehealth: Payer: Self-pay | Admitting: Family Medicine

## 2019-02-15 NOTE — Telephone Encounter (Signed)
With negative pcr test, technically per cdc guidelines she can. However caution patient that one out of five times this test is wrong in patient's who actually have the disease. She should use all precautions and not visit elderly family members for a full ten d after symtoms started

## 2019-02-15 NOTE — Telephone Encounter (Signed)
Patientt would like to return to work on 1/11 instead of the 18th. Please advise

## 2019-02-15 NOTE — Telephone Encounter (Signed)
Pt is being treated for sinus infection. Pt states she is not having any other symptoms and would like to go back to work on Monday. Pt COVID test is negative. Please advise. Thank you

## 2019-02-15 NOTE — Telephone Encounter (Signed)
Discussed with pt and she Needs work note for this Thursday and Friday and return on Monday. Please send to pt through [facility]

## 2019-02-25 ENCOUNTER — Other Ambulatory Visit: Payer: Self-pay

## 2019-02-25 ENCOUNTER — Encounter (HOSPITAL_COMMUNITY): Payer: Self-pay | Admitting: Psychiatry

## 2019-02-25 ENCOUNTER — Ambulatory Visit (INDEPENDENT_AMBULATORY_CARE_PROVIDER_SITE_OTHER): Payer: BC Managed Care – PPO | Admitting: Psychiatry

## 2019-02-25 DIAGNOSIS — F988 Other specified behavioral and emotional disorders with onset usually occurring in childhood and adolescence: Secondary | ICD-10-CM

## 2019-02-25 DIAGNOSIS — M797 Fibromyalgia: Secondary | ICD-10-CM

## 2019-02-25 DIAGNOSIS — F419 Anxiety disorder, unspecified: Secondary | ICD-10-CM

## 2019-02-25 DIAGNOSIS — F320 Major depressive disorder, single episode, mild: Secondary | ICD-10-CM | POA: Diagnosis not present

## 2019-02-25 DIAGNOSIS — F5081 Binge eating disorder: Secondary | ICD-10-CM

## 2019-02-25 MED ORDER — AMPHETAMINE-DEXTROAMPHETAMINE 10 MG PO TABS: 10.0000 mg | ORAL_TABLET | Freq: Two times a day (BID) | ORAL | 0 refills | Status: DC

## 2019-02-25 MED ORDER — BUPROPION HCL ER (SR) 150 MG PO TB12: 150.0000 mg | ORAL_TABLET | Freq: Two times a day (BID) | ORAL | 5 refills | Status: DC

## 2019-02-25 NOTE — Progress Notes (Signed)
Virtual Visit via Telephone Note  I connected with Sheri Wood on 02/25/19 at 11:40 AM EST by telephone and verified that I am speaking with the correct person using two identifiers.   I discussed the limitations, risks, security and privacy concerns of performing an evaluation and management service by telephone and the availability of in person appointments. I also discussed with the patient that there may be a patient responsible charge related to this service. The patient expressed understanding and agreed to proceed.    I discussed the assessment and treatment plan with the patient. The patient was provided an opportunity to ask questions and all were answered. The patient agreed with the plan and demonstrated an understanding of the instructions.   The patient was advised to call back or seek an in-person evaluation if the symptoms worsen or if the condition fails to improve as anticipated.  I provided 15 minutes of non-face-to-face time during this encounter.   Diannia Ruder, MD  [facility] MD/PA/NP OP Progress Note  02/25/2019 11:53 AM Sheri Wood  MRN:  [id number]  Chief Complaint:  Chief Complaint    Depression; Follow-up     HPI: This patient is a 49 year old married white female who lives with her husband in [place].  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  The patient returns for follow-up after 2 months.  She is followed up for depression ADD and binge eating disorder.  For the last couple months she has been on Adderall 10 mg twice daily and she states it is really helping her focus and also her binge eating.  She is cut down on the eating and feels like she has better control of it.  She states that her mood has been stable on the Wellbutrin and she rarely has to use the Xanax.  Overall she is sleeping well.  Her energy is good and she denies suicidal ideation. Visit Diagnosis:    ICD-10-CM   1. Mild single current episode of major  depressive disorder (HCC)  F32.0     Past Psychiatric History: Long-term outpatient treatment for depression and anxiety  Past Medical History:  Past Medical History:  Diagnosis Date  . Allergy    seasonal  . Anxiety   . Depression   . Fibromyalgia   . Gastritis    history  . GERD (gastroesophageal reflux disease)    otc med prn  . IBS (irritable bowel syndrome)   . Migraine   . SVD (spontaneous vaginal delivery)    x 2  . Urticaria, chronic   . Vertigo     Past Surgical History:  Procedure Laterality Date  . COLONOSCOPY  2008   negative  . DIAGNOSTIC LAPAROSCOPY     fibroids  . DILATION AND CURETTAGE OF UTERUS    . DILITATION & CURRETTAGE/HYSTROSCOPY WITH NOVASURE ABLATION N/A 05/06/2013   Procedure: DILATATION & CURETTAGE/HYSTEROSCOPY WITH NOVASURE ABLATION;  Surgeon: Alphonsus Sias. Ernestina Penna, MD;  Location: [facility];  Service: Gynecology;  Laterality: N/A;  . EYE SURGERY     bilateral lasik  . FOOT SURGERY  2017  . LAPAROSCOPIC BILATERAL SALPINGECTOMY Bilateral 05/06/2013   Procedure: LAPAROSCOPIC BILATERAL SALPINGECTOMY;  Surgeon: Tresa Endo A. Ernestina Penna, MD;  Location: [facility];  Service: Gynecology;  Laterality: Bilateral;  . WISDOM TOOTH EXTRACTION      Family Psychiatric History: see below  Family History:  Family History  Problem Relation Age of Onset  . Alcohol abuse Brother   . Drug abuse Brother   .  Depression Mother   . Anxiety disorder Mother   . Anxiety disorder Brother   . Depression Brother   . Bipolar disorder Maternal Uncle   . Anxiety disorder Paternal Grandfather   . Depression Paternal Grandfather   . Bipolar disorder Cousin     Social History:  Social History   Socioeconomic History  . Marital status: Married    Spouse name: Not on file  . Number of children: Not on file  . Years of education: Not on file  . Highest education level: Not on file  Occupational History  . Not on file  Tobacco Use  . Smoking status: Former Smoker    Packs/day:  0.75    Years: 10.00    Pack years: 7.50    Types: Cigarettes    Quit date: 02/08/1988    Years since quitting: 31.0  . Smokeless tobacco: Never Used  Substance and Sexual Activity  . Alcohol use: No  . Drug use: No  . Sexual activity: Yes    Birth control/protection: None  Other Topics Concern  . Not on file  Social History Narrative  . Not on file   Social Determinants of Health   Financial Resource Strain:   . Difficulty of Paying Living Expenses: Not on file  Food Insecurity:   . Worried About Programme researcher, broadcasting/film/video in the Last Year: Not on file  . Ran Out of Food in the Last Year: Not on file  Transportation Needs:   . Lack of Transportation (Medical): Not on file  . Lack of Transportation (Non-Medical): Not on file  Physical Activity:   . Days of Exercise per Week: Not on file  . Minutes of Exercise per Session: Not on file  Stress:   . Feeling of Stress : Not on file  Social Connections:   . Frequency of Communication with Friends and Family: Not on file  . Frequency of Social Gatherings with Friends and Family: Not on file  . Attends Religious Services: Not on file  . Active Member of Clubs or Organizations: Not on file  . Attends Banker Meetings: Not on file  . Marital Status: Not on file    Allergies:  Allergies  Allergen Reactions  . Ciprofloxacin Other (See Comments)    Severe muscle and joint pain  . Cefzil [Cefprozil]     possible hives  . Augmentin [Amoxicillin-Pot Clavulanate] Nausea And Vomiting    Throat felt funny and gave pt anxiety  . Bee Venom Swelling    Metabolic Disorder Labs: No results found for: HGBA1C, MPG No results found for: PROLACTIN Lab Results  Component Value Date   CHOL 192 04/02/2015   TRIG 69 04/02/2015   HDL 60 04/02/2015   CHOLHDL 3.2 04/02/2015   VLDL 14 04/02/2015   LDLCALC 118 (H) 04/02/2015   Lab Results  Component Value Date   TSH 1.940 03/23/2015    Therapeutic Level Labs: No results found  for: LITHIUM No results found for: VALPROATE No components found for:  CBMZ  Current Medications: Current Outpatient Medications  Medication Sig Dispense Refill  . ALPRAZolam (XANAX) 1 MG tablet Take 1 tablet (1 mg total) by mouth daily as needed for anxiety. 30 tablet 2  . amphetamine-dextroamphetamine (ADDERALL) 10 MG tablet Take 1 tablet (10 mg total) by mouth 2 (two) times daily with a meal. 60 tablet 0  . amphetamine-dextroamphetamine (ADDERALL) 10 MG tablet Take 1 tablet (10 mg total) by mouth 2 (two) times daily with a  meal. 60 tablet 0  . amphetamine-dextroamphetamine (ADDERALL) 10 MG tablet Take 1 tablet (10 mg total) by mouth 2 (two) times daily with a meal. Fill after 04/24/2019 60 tablet 0  . buPROPion (WELLBUTRIN SR) 150 MG 12 hr tablet Take 1 tablet (150 mg total) by mouth 2 (two) times daily. 60 tablet 5  . diazepam (VALIUM) 5 MG tablet Take 1 tablet (5 mg total) by mouth every 6 (six) hours as needed. 30 tablet 1  . doxycycline (VIBRA-TABS) 100 MG tablet Take 1 tablet (100 mg total) by mouth 2 (two) times daily. 20 tablet 0  . eletriptan (RELPAX) 40 MG tablet TAKE 1 TABLET BY MOUTH AT ONSET OF MIGRAINE, MAY REPEAT ONCE IN 2 HOURS LATER. NO MORE THAN 2 A DAY. 8 tablet 6  . famotidine (PEPCID) 40 MG tablet Take 1 tablet (40 mg total) by mouth daily. 30 tablet 0  . hydroxychloroquine (PLAQUENIL) 200 MG tablet Take by mouth 2 (two) times daily.    . hydrOXYzine (ATARAX/VISTARIL) 25 MG tablet Take 1 tablet (25 mg total) by mouth 3 (three) times daily as needed for itching. 30 tablet 2  . meclizine (ANTIVERT) 25 MG tablet TAKE (1) TABLET BY MOUTH THREE TIMES DAILY AS NEEDED FOR DIZZINESS. 30 tablet 3  . meloxicam (MOBIC) 15 MG tablet     . methotrexate (RHEUMATREX) 2.5 MG tablet Take 10 mg by mouth once a week.    . naltrexone (DEPADE) 50 MG tablet Take 1 tablet (50 mg total) by mouth daily. (Patient not taking: Reported on 02/13/2019) 30 tablet 2  . NON FORMULARY Taking clabetazol Cream  twice a week    . pramipexole (MIRAPEX) 1.5 MG tablet Take 1 tablet (1.5 mg total) by mouth at bedtime. 30 tablet 5  . pramipexole (MIRAPEX) 1.5 MG tablet TAKE 1/2 TABLET BY MOUTH AT BEDTIME.    . predniSONE (DELTASONE) 5 MG tablet Take 5 mg by mouth daily.    [name] terconazole (TERAZOL 7) 0.4 % vaginal cream Place 1 applicator vaginally at bedtime. (Patient not taking: Reported on 02/13/2019) 45 g 2   No current facility-administered medications for this visit.     Musculoskeletal: Strength & Muscle Tone: within normal limits Gait & Station: normal Patient leans: N/A  Psychiatric Specialty Exam: Review of Systems  All other systems reviewed and are negative.   There were no vitals taken for this visit.There is no height or weight on file to calculate BMI.  General Appearance: NA  Eye Contact:  NA  Speech:  Clear and Coherent  Volume:  Normal  Mood:  Euthymic  Affect:  Appropriate and Congruent  Thought Process:  Goal Directed  Orientation:  Full (Time, Place, and Person)  Thought Content: WDL   Suicidal Thoughts:  No  Homicidal Thoughts:  No  Memory:  Immediate;   Good Recent;   Good Remote;   Good  Judgement:  Good  Insight:  Good  Psychomotor Activity:  Normal  Concentration:  Concentration: Good and Attention Span: Good  Recall:  Good  Fund of Knowledge: Good  Language: Good  Akathisia:  No  Handed:  Right  AIMS (if indicated): not done  Assets:  Communication Skills Desire for Improvement Physical Health Resilience Social Support Talents/Skills  ADL's:  Intact  Cognition: WNL  Sleep:  Good   Screenings: GAD-7     Office Visit from 12/28/2017 in [facility]  Total GAD-7 Score  12    PHQ2-9     Office Visit from  12/28/2017 in [facility] Visit from 02/03/2017 in [facility]  PHQ-2 Total Score  3  3  PHQ-9 Total Score  9  7       Assessment and Plan:  This patient is a 49 year old female with a history  of fibromyalgia depression anxiety ADD and binge eating disorder.  She is doing better with Adderall in terms of both the ADD and the binge eating.  She denies any side effects such as palpitations.  She will continue Adderall 10 mg twice daily.  She will also continue Wellbutrin SR 150 mg twice daily for depression.  She will return to see me in 3 months  [name], MD 02/25/2019, 11:53 AM

## 2019-03-13 ENCOUNTER — Encounter: Payer: Self-pay | Admitting: Family Medicine

## 2019-03-15 ENCOUNTER — Encounter: Payer: Self-pay | Admitting: Family Medicine

## 2019-03-15 ENCOUNTER — Other Ambulatory Visit: Payer: Self-pay | Admitting: Nurse Practitioner

## 2019-03-15 MED ORDER — TERCONAZOLE 0.8 % VA CREA: 1.0000 | TOPICAL_CREAM | Freq: Every day | VAGINAL | 0 refills | Status: DC

## 2019-04-05 ENCOUNTER — Encounter: Payer: Self-pay | Admitting: Family Medicine

## 2019-04-06 ENCOUNTER — Other Ambulatory Visit: Payer: Self-pay | Admitting: Nurse Practitioner

## 2019-04-06 MED ORDER — TERCONAZOLE 0.8 % VA CREA: 1.0000 | TOPICAL_CREAM | Freq: Every day | VAGINAL | 0 refills | Status: DC

## 2019-05-03 ENCOUNTER — Other Ambulatory Visit: Payer: Self-pay | Admitting: Family Medicine

## 2019-05-03 NOTE — Telephone Encounter (Signed)
Ok plus three ref 

## 2019-05-27 ENCOUNTER — Ambulatory Visit (HOSPITAL_COMMUNITY): Payer: BC Managed Care – PPO | Admitting: Psychiatry

## 2019-06-05 ENCOUNTER — Ambulatory Visit (HOSPITAL_COMMUNITY): Payer: BC Managed Care – PPO | Admitting: Psychiatry

## 2019-06-07 ENCOUNTER — Other Ambulatory Visit: Payer: Self-pay

## 2019-06-07 ENCOUNTER — Telehealth: Payer: Self-pay | Admitting: *Deleted

## 2019-06-07 ENCOUNTER — Telehealth (INDEPENDENT_AMBULATORY_CARE_PROVIDER_SITE_OTHER): Payer: BC Managed Care – PPO | Admitting: Nurse Practitioner

## 2019-06-07 DIAGNOSIS — F5081 Binge eating disorder: Secondary | ICD-10-CM | POA: Diagnosis not present

## 2019-06-07 MED ORDER — LISDEXAMFETAMINE DIMESYLATE 30 MG PO CAPS: 30.0000 mg | ORAL_CAPSULE | Freq: Every day | ORAL | 0 refills | Status: DC

## 2019-06-07 NOTE — Telephone Encounter (Signed)
Ms. marcy, sookdeo are scheduled for a virtual visit with your provider today.    Just as we do with appointments in the office, we must obtain your consent to participate.  Your consent will be active for this visit and any virtual visit you may have with one of our providers in the next 365 days.    If you have a MyChart account, I can also send a copy of this consent to you electronically.  All virtual visits are billed to your insurance company just like a traditional visit in the office.  As this is a virtual visit, video technology does not allow for your provider to perform a traditional examination.  This may limit your provider's ability to fully assess your condition.  If your provider identifies any concerns that need to be evaluated in person or the need to arrange testing such as labs, EKG, etc, we will make arrangements to do so.    Although advances in technology are sophisticated, we cannot ensure that it will always work on either your end or our end.  If the connection with a video visit is poor, we may have to switch to a telephone visit.  With either a video or telephone visit, we are not always able to ensure that we have a secure connection.   I need to obtain your verbal consent now.   Are you willing to proceed with your visit today?   PRIYA MATSEN has provided verbal consent on 06/07/2019 for a virtual visit (video or telephone).   Kathleen Lime, RN 06/07/2019  1:30 PM

## 2019-06-07 NOTE — Progress Notes (Signed)
   Subjective:    Patient ID: Sheri Wood, female    DOB: 09/11/1970, 49 y.o.   MRN: [id number]  HPI Patient calls in today to discuss weight loss.  Current weight 191lbs, Height 68in.   Review of Systems     Objective:   Physical Exam   Virtual Visit via Video Note  I connected with Sheri Wood on 06/07/19 at  1:40 PM EDT by a video enabled telemedicine application and verified that I am speaking with the correct person using two identifiers.  Location: Patient: home Provider: office   I discussed the limitations of evaluation and management by telemedicine and the availability of in person appointments. The patient expressed understanding and agreed to proceed.  History of Present Illness: Presents to discuss weight loss. Has not seen any difference on Adderall. Wears off after a few hours. Did very well on Vyvanse but had increased anxiety and palpitations on the 70 mg dose. No issues that she can remember on lower doses. Started back at her gym in September. States the problem is not activity but binge eating.    Observations/Objective: Today's visit was via telephone Physical exam was not possible for this visit Alert, oriented. Cheerful affect.   Assessment and Plan: Problem List Items Addressed This Visit      Other   Binge eating disorder - Primary   Morbid obesity due to excess calories (HCC)   Relevant Medications   lisdexamfetamine (VYVANSE) 30 MG capsule     Meds ordered this encounter  Medications  . lisdexamfetamine (VYVANSE) 30 MG capsule    Sig: Take 1 capsule (30 mg total) by mouth daily.    Dispense:  30 capsule    Refill:  0    Order Specific Question:   Supervising Provider    Answer:   Lilyan Punt A [[id number]]     Follow Up Instructions: Stop Adderall. Switch to low dose Vyvnase. DC med and contact office if any problems. Continue activity. Follow up with gynecology for her physical and mammogram. Return in about 3 months (around  09/06/2019).    I discussed the assessment and treatment plan with the patient. The patient was provided an opportunity to ask questions and all were answered. The patient agreed with the plan and demonstrated an understanding of the instructions.   The patient was advised to call back or seek an in-person evaluation if the symptoms worsen or if the condition fails to improve as anticipated.  I provided 15 minutes of non-face-to-face time during this encounter.       Assessment & Plan:

## 2019-06-07 NOTE — Telephone Encounter (Signed)
error 

## 2019-06-07 NOTE — Telephone Encounter (Signed)
Ms. acire, tang are scheduled for a virtual visit with your provider today.    Just as we do with appointments in the office, we must obtain your consent to participate.  Your consent will be active for this visit and any virtual visit you may have with one of our providers in the next 365 days.    If you have a MyChart account, I can also send a copy of this consent to you electronically.  All virtual visits are billed to your insurance company just like a traditional visit in the office.  As this is a virtual visit, video technology does not allow for your provider to perform a traditional examination.  This may limit your provider's ability to fully assess your condition.  If your provider identifies any concerns that need to be evaluated in person or the need to arrange testing such as labs, EKG, etc, we will make arrangements to do so.    Although advances in technology are sophisticated, we cannot ensure that it will always work on either your end or our end.  If the connection with a video visit is poor, we may have to switch to a telephone visit.  With either a video or telephone visit, we are not always able to ensure that we have a secure connection.   I need to obtain your verbal consent now.   Are you willing to proceed with your visit today?   CORYNN SOLBERG has provided verbal consent on 06/07/2019 for a virtual visit (video or telephone).   Haze Rushing, LPN 3/57/0177  93:90 AM

## 2019-06-09 ENCOUNTER — Encounter: Payer: Self-pay | Admitting: Nurse Practitioner

## 2019-06-17 ENCOUNTER — Other Ambulatory Visit: Payer: Self-pay

## 2019-06-17 ENCOUNTER — Encounter (HOSPITAL_COMMUNITY): Payer: Self-pay | Admitting: Psychiatry

## 2019-06-17 ENCOUNTER — Telehealth (INDEPENDENT_AMBULATORY_CARE_PROVIDER_SITE_OTHER): Payer: BC Managed Care – PPO | Admitting: Psychiatry

## 2019-06-17 DIAGNOSIS — F320 Major depressive disorder, single episode, mild: Secondary | ICD-10-CM | POA: Diagnosis not present

## 2019-06-17 MED ORDER — ALPRAZOLAM 1 MG PO TABS: 1.0000 mg | ORAL_TABLET | Freq: Every day | ORAL | 2 refills | Status: DC | PRN

## 2019-06-17 MED ORDER — BUPROPION HCL ER (SR) 150 MG PO TB12: 150.0000 mg | ORAL_TABLET | Freq: Two times a day (BID) | ORAL | 5 refills | Status: DC

## 2019-06-17 NOTE — Progress Notes (Signed)
Virtual Visit via Telephone Note  I connected with Sheri Wood on 06/17/19 at 11:00 AM EDT by telephone and verified that I am speaking with the correct person using two identifiers.   I discussed the limitations, risks, security and privacy concerns of performing an evaluation and management service by telephone and the availability of in person appointments. I also discussed with the patient that there may be a patient responsible charge related to this service. The patient expressed understanding and agreed to proceed.       I discussed the assessment and treatment plan with the patient. The patient was provided an opportunity to ask questions and all were answered. The patient agreed with the plan and demonstrated an understanding of the instructions.   The patient was advised to call back or seek an in-person evaluation if the symptoms worsen or if the condition fails to improve as anticipated.  I provided 15 minutes of non-face-to-face time during this encounter.   Diannia Ruder, MD  [facility] MD/PA/NP OP Progress Note  06/17/2019 11:13 AM Sheri Wood  MRN:  [id number]  Chief Complaint:  Chief Complaint    Depression; Anxiety; Follow-up     HPI: This patient is a 49 year old married white female lives with her husband and [employer].  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  The patient returns for follow-up after 4 months.  She is following up for depression ADD and binge eating disorder.  She states that recently the Adderall was not helping her binge eating and her primary doctor put her back on Vyvanse of the lower dose.  She has had palpitations just a couple of times on the 30 mg dose.  She states that her mood has been fairly stable on the Wellbutrin and she rarely uses the Xanax.  She is sleeping okay.  She still gets sad at times particularly because she misses her mother.  However she is very functional at work and home and she denies  suicidal ideation Visit Diagnosis:    ICD-10-CM   1. Mild single current episode of major depressive disorder (HCC)  F32.0     Past Psychiatric History: Long-term outpatient treatment for depression and anxiety  Past Medical History:  Past Medical History:  Diagnosis Date  . Allergy    seasonal  . Anxiety   . Depression   . Fibromyalgia   . Gastritis    history  . GERD (gastroesophageal reflux disease)    otc med prn  . IBS (irritable bowel syndrome)   . Migraine   . SVD (spontaneous vaginal delivery)    x 2  . Urticaria, chronic   . Vertigo     Past Surgical History:  Procedure Laterality Date  . COLONOSCOPY  2008   negative  . DIAGNOSTIC LAPAROSCOPY     fibroids  . DILATION AND CURETTAGE OF UTERUS    . DILITATION & CURRETTAGE/HYSTROSCOPY WITH NOVASURE ABLATION N/A 05/06/2013   Procedure: DILATATION & CURETTAGE/HYSTEROSCOPY WITH NOVASURE ABLATION;  Surgeon: Alphonsus Sias. Ernestina Penna, MD;  Location: [facility];  Service: Gynecology;  Laterality: N/A;  . EYE SURGERY     bilateral lasik  . FOOT SURGERY  2017  . LAPAROSCOPIC BILATERAL SALPINGECTOMY Bilateral 05/06/2013   Procedure: LAPAROSCOPIC BILATERAL SALPINGECTOMY;  Surgeon: Tresa Endo A. Ernestina Penna, MD;  Location: [facility];  Service: Gynecology;  Laterality: Bilateral;  . WISDOM TOOTH EXTRACTION      Family Psychiatric History: see below  Family History:  Family History  Problem  Relation Age of Onset  . Alcohol abuse Brother   . Drug abuse Brother   . Depression Mother   . Anxiety disorder Mother   . Anxiety disorder Brother   . Depression Brother   . Bipolar disorder Maternal Uncle   . Anxiety disorder Paternal Grandfather   . Depression Paternal Grandfather   . Bipolar disorder Cousin     Social History:  Social History   Socioeconomic History  . Marital status: Married    Spouse name: Not on file  . Number of children: Not on file  . Years of education: Not on file  . Highest education level: Not on file   Occupational History  . Not on file  Tobacco Use  . Smoking status: Former Smoker    Packs/day: 0.75    Years: 10.00    Pack years: 7.50    Types: Cigarettes    Quit date: 02/08/1988    Years since quitting: 31.3  . Smokeless tobacco: Never Used  Substance and Sexual Activity  . Alcohol use: No  . Drug use: No  . Sexual activity: Yes    Birth control/protection: None  Other Topics Concern  . Not on file  Social History Narrative  . Not on file   Social Determinants of Health   Financial Resource Strain:   . Difficulty of Paying Living Expenses:   Food Insecurity:   . Worried About Charity fundraiser in the Last Year:   . Arboriculturist in the Last Year:   Transportation Needs:   . Film/video editor (Medical):   [name] Lack of Transportation (Non-Medical):   Physical Activity:   . Days of Exercise per Week:   . Minutes of Exercise per Session:   Stress:   . Feeling of Stress :   Social Connections:   . Frequency of Communication with Friends and Family:   . Frequency of Social Gatherings with Friends and Family:   . Attends Religious Services:   . Active Member of Clubs or Organizations:   . Attends Archivist Meetings:   [name] Marital Status:     Allergies:  Allergies  Allergen Reactions  . Ciprofloxacin Other (See Comments)    Severe muscle and joint pain  . Cefzil [Cefprozil]     possible hives  . Augmentin [Amoxicillin-Pot Clavulanate] Nausea And Vomiting    Throat felt funny and gave pt anxiety  . Bee Venom Swelling    Metabolic Disorder Labs: No results found for: HGBA1C, MPG No results found for: PROLACTIN Lab Results  Component Value Date   CHOL 192 04/02/2015   TRIG 69 04/02/2015   HDL 60 04/02/2015   CHOLHDL 3.2 04/02/2015   VLDL 14 04/02/2015   LDLCALC 118 (H) 04/02/2015   Lab Results  Component Value Date   TSH 1.940 03/23/2015    Therapeutic Level Labs: No results found for: LITHIUM No results found for: VALPROATE No  components found for:  CBMZ  Current Medications: Current Outpatient Medications  Medication Sig Dispense Refill  . ALPRAZolam (XANAX) 1 MG tablet Take 1 tablet (1 mg total) by mouth daily as needed for anxiety. 30 tablet 2  . buPROPion (WELLBUTRIN SR) 150 MG 12 hr tablet Take 1 tablet (150 mg total) by mouth 2 (two) times daily. 60 tablet 5  . diazepam (VALIUM) 5 MG tablet TAKE 1 TABLET BY MOUTH EVERY 6 HOURS AS NEEDED. 30 tablet 3  . eletriptan (RELPAX) 40 MG tablet TAKE 1 TABLET BY  MOUTH AT ONSET OF MIGRAINE, MAY REPEAT ONCE IN 2 HOURS LATER. NO MORE THAN 2 A DAY. 8 tablet 6  . famotidine (PEPCID) 40 MG tablet Take 1 tablet (40 mg total) by mouth daily. 30 tablet 0  . hydrOXYzine (ATARAX/VISTARIL) 25 MG tablet Take 1 tablet (25 mg total) by mouth 3 (three) times daily as needed for itching. (Patient not taking: Reported on 06/07/2019) 30 tablet 2  . lisdexamfetamine (VYVANSE) 30 MG capsule Take 1 capsule (30 mg total) by mouth daily. 30 capsule 0  . meclizine (ANTIVERT) 25 MG tablet TAKE (1) TABLET BY MOUTH THREE TIMES DAILY AS NEEDED FOR DIZZINESS. 30 tablet 3  . meloxicam (MOBIC) 15 MG tablet     . NON FORMULARY Taking clabetazol Cream twice a week    . pramipexole (MIRAPEX) 1.5 MG tablet Take 1 tablet (1.5 mg total) by mouth at bedtime. 30 tablet 5  . terconazole (TERAZOL 3) 0.8 % vaginal cream Place 1 applicator vaginally at bedtime. For 3 days 20 g 0   No current facility-administered medications for this visit.     Musculoskeletal: Strength & Muscle Tone: within normal limits Gait & Station: normal Patient leans: N/A  Psychiatric Specialty Exam: Review of Systems  Musculoskeletal: Positive for arthralgias and joint swelling.  All other systems reviewed and are negative.   There were no vitals taken for this visit.There is no height or weight on file to calculate BMI.  General Appearance: NA  Eye Contact:  NA  Speech:  Clear and Coherent  Volume:  Normal  Mood:  Euthymic   Affect:  NA  Thought Process:  Goal Directed  Orientation:  Full (Time, Place, and Person)  Thought Content: Rumination   Suicidal Thoughts:  No  Homicidal Thoughts:  No  Memory:  Immediate;   Good Recent;   Good Remote;   Fair  Judgement:  Good  Insight:  Good  Psychomotor Activity:  Normal  Concentration:  Concentration: Fair and Attention Span: Fair  Recall:  Good  Fund of Knowledge: Good  Language: Good  Akathisia:  No  Handed:  Right  AIMS (if indicated): not done  Assets:  Communication Skills Desire for Improvement Physical Health Resilience Social Support Talents/Skills  ADL's:  Intact  Cognition: WNL  Sleep:  Good   Screenings: GAD-7     Telemedicine from 06/07/2019 in [facility] Office Visit from 12/28/2017 in [facility]  Total GAD-7 Score  14  12    PHQ2-9     Telemedicine from 06/07/2019 in [facility] Office Visit from 12/28/2017 in [facility] Office Visit from 02/03/2017 in [facility]  PHQ-2 Total Score  2  3  3   PHQ-9 Total Score  11  9  7        Assessment and Plan: This patient is a 49 year old female with a history of fibromyalgia depression anxiety and binge eating disorder.  She still is struggling with the eating and is retrying Vyvanse.  She will continue Wellbutrin SR 150 mg daily for depression and Xanax 1 mg daily as needed for anxiety.  She will return to see me in 3 months   , MD 06/17/2019, 11:13 AM

## 2019-07-09 ENCOUNTER — Other Ambulatory Visit: Payer: Self-pay | Admitting: Family Medicine

## 2019-07-09 NOTE — Telephone Encounter (Signed)
Please contact patient to have her set up appt; then may route back to nurses. Thank you 

## 2019-07-09 NOTE — Telephone Encounter (Signed)
lvm to scheduled med check with Dr. Ladona Ridgel.

## 2019-07-09 NOTE — Telephone Encounter (Signed)
Pt scheduled 6/22

## 2019-07-16 ENCOUNTER — Other Ambulatory Visit: Payer: Self-pay | Admitting: Nurse Practitioner

## 2019-07-17 NOTE — Telephone Encounter (Signed)
Changed to vyvanse on 4/30 and recommended follow up on 7/30

## 2019-07-24 ENCOUNTER — Telehealth: Payer: Self-pay | Admitting: Orthopaedic Surgery

## 2019-07-24 NOTE — Telephone Encounter (Signed)
Patient called regarding thumb, wrist, and most recently elbow pain. States she had seen orthopaedic hand specialist for thumb/hand problem within the last 2 years, and would like to come to our clinic. Discussed 2nd opinion protocol; voiced understanding, and will call back when she has obtained medical records/reports/films.

## 2019-07-30 ENCOUNTER — Ambulatory Visit: Payer: BC Managed Care – PPO | Admitting: Family Medicine

## 2019-07-30 ENCOUNTER — Encounter: Payer: Self-pay | Admitting: Family Medicine

## 2019-07-30 ENCOUNTER — Other Ambulatory Visit: Payer: Self-pay

## 2019-07-30 VITALS — BP 120/72 | HR 67 | Temp 97.2°F | Wt 187.2 lb

## 2019-07-30 DIAGNOSIS — R42 Dizziness and giddiness: Secondary | ICD-10-CM

## 2019-07-30 DIAGNOSIS — G2581 Restless legs syndrome: Secondary | ICD-10-CM | POA: Diagnosis not present

## 2019-07-30 MED ORDER — PRAMIPEXOLE DIHYDROCHLORIDE 1.5 MG PO TABS: 1.5000 mg | ORAL_TABLET | Freq: Every day | ORAL | 5 refills | Status: DC

## 2019-07-30 MED ORDER — MECLIZINE HCL 25 MG PO TABS: 25.0000 mg | ORAL_TABLET | Freq: Two times a day (BID) | ORAL | 0 refills | Status: DC | PRN

## 2019-07-30 NOTE — Addendum Note (Signed)
Addended by: Annalee Genta on: 07/30/2019 09:44 AM   Modules accepted: Orders

## 2019-07-30 NOTE — Progress Notes (Signed)
Patient ID: Sheri Wood, female    DOB: 09/12/1970, 49 y.o.   MRN: [id number]   Chief Complaint  Patient presents with  . Follow-up    restless legs   Subjective:    HPI Pt has h/o restless legs and was on 0.75 and now taking 1 whole tablet, was taking 1/2 tab.  Seeing rheum and sero-neg.  Has h/o fibromyalgia.  Her chronic pain is- Thumb pain, wrist pains, shoulder pain. Was on meloxicam and predinose and did methrotrexate.- off these now. Pt feels her restless leg is tolerable and the mirapex helps.  But at times feels it's happening during the day too.  Not taking mobic.  Taking diclofenac 75mg  bid dosing.  Seeing Dr. for anxiety and depression- rarely taking it only a few times a year.   History of vertigo and taking meclazine and taking valium. Usually getting it seasonal with fall and spring. No palpitations.  H/o vertigo, seen ENT in the past.  Taking meclazine and if not helping then will take valium. At times pt stating she has to take valium and meclazine together when her vertigo was really bad.  She said this was prescribed to her by last pcp.  Medical History Sheri Wood has a past medical history of Allergy, Anxiety, Depression, Fibromyalgia, Gastritis, GERD (gastroesophageal reflux disease), IBS (irritable bowel syndrome), Migraine, SVD (spontaneous vaginal delivery), Urticaria, chronic, and Vertigo.   Outpatient Encounter Medications as of 07/30/2019  Medication Sig  . ALPRAZolam (XANAX) 1 MG tablet Take 1 tablet (1 mg total) by mouth daily as needed for anxiety.  08/01/2019 buPROPion (WELLBUTRIN SR) 150 MG 12 hr tablet Take 1 tablet (150 mg total) by mouth 2 (two) times daily.  . diazepam (VALIUM) 5 MG tablet TAKE 1 TABLET BY MOUTH EVERY 6 HOURS AS NEEDED.  [name] eletriptan (RELPAX) 40 MG tablet TAKE 1 TABLET BY MOUTH AT ONSET OF MIGRAINE, MAY REPEAT ONCE IN 2 HOURS LATER. NO MORE THAN 2 A DAY.  . famotidine (PEPCID) 40 MG tablet Take 1 tablet (40 mg total) by  mouth daily. (Patient not taking: Reported on 07/30/2019)  . hydrOXYzine (ATARAX/VISTARIL) 25 MG tablet Take 1 tablet (25 mg total) by mouth 3 (three) times daily as needed for itching. (Patient not taking: Reported on 06/07/2019)  . meclizine (ANTIVERT) 25 MG tablet Take 1 tablet (25 mg total) by mouth 2 (two) times daily as needed for dizziness.  . NON FORMULARY Taking clabetazol Cream twice a week  . pramipexole (MIRAPEX) 1.5 MG tablet Take 1 tablet (1.5 mg total) by mouth at bedtime.  06/09/2019 terconazole (TERAZOL 3) 0.8 % vaginal cream Place 1 applicator vaginally at bedtime. For 3 days  . VYVANSE 30 MG capsule TAKE ONE CAPSULE BY MOUTHONCE DAILY.  . [DISCONTINUED] meclizine (ANTIVERT) 25 MG tablet TAKE (1) TABLET BY MOUTH THREE TIMES DAILY AS NEEDED FOR DIZZINESS.  . [DISCONTINUED] meloxicam (MOBIC) 15 MG tablet   . [DISCONTINUED] pramipexole (MIRAPEX) 1.5 MG tablet TAKE 1 TABLET AT BEDTIME.   No facility-administered encounter medications on file as of 07/30/2019.     Review of Systems  Constitutional: Negative for chills and fever.  HENT: Negative for congestion, rhinorrhea and sore throat.   Respiratory: Negative for cough, shortness of breath and wheezing.   Cardiovascular: Negative for chest pain and leg swelling.  Gastrointestinal: Negative for abdominal pain, diarrhea, nausea and vomiting.  Genitourinary: Negative for dysuria and frequency.  Musculoskeletal: Negative for arthralgias and back pain.  Skin: Negative for rash.  Neurological: Negative for dizziness, weakness and headaches.       +chronic restless leg syndrome.  And intermittent vertigo.     Vitals BP 120/72   Pulse 67   Temp (!) 97.2 F (36.2 C) (Oral)   Wt 187 lb 3.2 oz (84.9 kg)   SpO2 99%   BMI 29.32 kg/m   Objective:   Physical Exam Vitals and nursing note reviewed.  Constitutional:      General: She is not in acute distress.    Appearance: Normal appearance. She is not ill-appearing.  HENT:     Head:  Normocephalic and atraumatic.  Cardiovascular:     Rate and Rhythm: Normal rate and regular rhythm.     Pulses: Normal pulses.     Heart sounds: Normal heart sounds.  Pulmonary:     Effort: Pulmonary effort is normal.     Breath sounds: Normal breath sounds. No wheezing, rhonchi or rales.  Musculoskeletal:        General: No tenderness. Normal range of motion.     Right lower leg: No edema.     Left lower leg: No edema.  Skin:    General: Skin is warm and dry.     Findings: No lesion or rash.  Neurological:     General: No focal deficit present.     Mental Status: She is alert and oriented to person, place, and time.     Cranial Nerves: No cranial nerve deficit.     Motor: No weakness.     Gait: Gait normal.  Psychiatric:        Mood and Affect: Mood normal.        Behavior: Behavior normal.        Thought Content: Thought content normal.        Judgment: Judgment normal.      Assessment and Plan   1. Restless leg syndrome - pramipexole (MIRAPEX) 1.5 MG tablet; Take 1 tablet (1.5 mg total) by mouth at bedtime.  Dispense: 30 tablet; Refill: 5  2. Vertigo - meclizine (ANTIVERT) 25 MG tablet; Take 1 tablet (25 mg total) by mouth 2 (two) times daily as needed for dizziness.  Dispense: 30 tablet; Refill: 0    Long discussion about the multiple sedatives on the chart and multiple prescribers.  Advising to limit the use of these.  And would only prescribe the valium/meclazine prn for her flare ups not with multiple refills.   Pt already getting 1mg  xanax per her psychiatrist and only using very sparingly for a panic attack.  Pt has been seeing NP Sheri Wood for binge eating and restarted vyvanse in lower dose at 30mg  daily.  Pt had previous palpitations at the 70mg  dose.  F/u 6 mo or prn.

## 2019-08-05 ENCOUNTER — Other Ambulatory Visit: Payer: Self-pay | Admitting: Nurse Practitioner

## 2019-08-14 ENCOUNTER — Ambulatory Visit: Payer: BC Managed Care – PPO | Admitting: Family Medicine

## 2019-08-14 ENCOUNTER — Encounter: Payer: Self-pay | Admitting: Family Medicine

## 2019-08-14 ENCOUNTER — Other Ambulatory Visit: Payer: Self-pay

## 2019-08-14 VITALS — BP 124/74 | Temp 97.3°F | Wt 185.4 lb

## 2019-08-14 DIAGNOSIS — M2550 Pain in unspecified joint: Secondary | ICD-10-CM | POA: Diagnosis not present

## 2019-08-14 DIAGNOSIS — M797 Fibromyalgia: Secondary | ICD-10-CM

## 2019-08-14 MED ORDER — CYCLOBENZAPRINE HCL 10 MG PO TABS: ORAL_TABLET | ORAL | 3 refills | Status: DC

## 2019-08-14 MED ORDER — DULOXETINE HCL 30 MG PO CPEP: 30.0000 mg | ORAL_CAPSULE | Freq: Every day | ORAL | 3 refills | Status: DC

## 2019-08-14 NOTE — Progress Notes (Signed)
   Subjective:    Patient ID: Sheri Wood, female    DOB: 10-Aug-1970, 49 y.o.   MRN: [id number]  HPI Pt here today for fibromyalgia. Pt has had fibromyalgia for a while. Pt is seeing Rheumatologist; last visit was phone call on 08/07/2019. Pt has tried Prednisone, Meloxicam and Methotrexate. Pt is currently taking Diclofenac 75 mg BID. Pt states she is miserable and is wanting to make sure she has done everything before doing Enbrel injections once a week. Pt would also like a second opinion from another rheumatologist.  Patient having a very difficult time.  Currently her current rheumatologist thinks that she may have seronegative rheumatoid arthritis The patient is being encouraged to start on Enbrel but she is very hesitant because of family history of cancers Patient also has significant fibromyalgia with body aches pains discomfort soreness in the muscles She also has significant underlying anxiety issues being managed by Dr. 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  Right discussed this with her she is willing to try this and has agreed to try this in place of her Wellbutrin we will send an  update to her psychiatrist Dr. Tenny Craw to keep her involved patient will be following up with Dr. Tenny Craw in the near future

## 2019-08-23 ENCOUNTER — Telehealth: Payer: BC Managed Care – PPO | Admitting: Emergency Medicine

## 2019-08-23 DIAGNOSIS — R300 Dysuria: Secondary | ICD-10-CM | POA: Diagnosis not present

## 2019-08-23 MED ORDER — NITROFURANTOIN MONOHYD MACRO 100 MG PO CAPS
100.0000 mg | ORAL_CAPSULE | Freq: Two times a day (BID) | ORAL | 0 refills | Status: AC
Start: 2019-08-23 — End: 2019-08-30

## 2019-08-23 NOTE — Progress Notes (Signed)
Time spent: 10 min  We are sorry that you are not feeling well.  Here is how we plan to help!  Based on what you shared with me it looks like you most likely have a simple urinary tract infection.  A UTI (Urinary Tract Infection) is a bacterial infection of the bladder.  Most cases of urinary tract infections are simple to treat but a key part of your care is to encourage you to drink plenty of fluids and watch your symptoms carefully.  I have prescribed Keflex 500 mg twice a day for 7 days.  Your symptoms should gradually improve. Call us if the burning in your urine worsens, you develop worsening fever, back pain or pelvic pain or if your symptoms do not resolve after completing the antibiotic.  Urinary tract infections can be prevented by drinking plenty of water to keep your body hydrated.  Also be sure when you wipe, wipe from front to back and don't hold it in!  If possible, empty your bladder every 4 hours.  Your e-visit answers were reviewed by a board certified advanced clinical practitioner to complete your personal care plan.  Depending on the condition, your plan could have included both over the counter or prescription medications.  If there is a problem please reply  once you have received a response from your provider.  Your safety is important to us.  If you have drug allergies check your prescription carefully.    You can use MyChart to ask questions about today's visit, request a non-urgent call back, or ask for a work or school excuse for 24 hours related to this e-Visit. If it has been greater than 24 hours you will need to follow up with your provider, or enter a new e-Visit to address those concerns.   You will get an e-mail in the next two days asking about your experience.  I hope that your e-visit has been valuable and will speed your recovery. Thank you for using e-visits.    

## 2019-08-26 ENCOUNTER — Other Ambulatory Visit: Payer: Self-pay | Admitting: Nurse Practitioner

## 2019-08-27 ENCOUNTER — Encounter: Payer: Self-pay | Admitting: Family Medicine

## 2019-08-30 ENCOUNTER — Encounter: Payer: Self-pay | Admitting: Nurse Practitioner

## 2019-08-30 ENCOUNTER — Ambulatory Visit (INDEPENDENT_AMBULATORY_CARE_PROVIDER_SITE_OTHER): Payer: BC Managed Care – PPO | Admitting: Nurse Practitioner

## 2019-08-30 ENCOUNTER — Other Ambulatory Visit: Payer: Self-pay

## 2019-08-30 VITALS — BP 110/74 | Temp 97.4°F | Wt 185.6 lb

## 2019-08-30 DIAGNOSIS — F5081 Binge eating disorder: Secondary | ICD-10-CM | POA: Diagnosis not present

## 2019-08-30 DIAGNOSIS — F50819 Binge eating disorder, unspecified: Secondary | ICD-10-CM

## 2019-08-30 NOTE — Progress Notes (Signed)
   Subjective:    Patient ID: Sheri Wood, female    DOB: 12/05/1970, 49 y.o.   MRN: [id number]  HPI Patient comes in today for follow up on medications.  Continues follow up with rheumatology for symptoms consistent with RA. Her tests are negative. Was switched to Cymbalta and stopped Wellbutrin to see if this would help her pain. Has not noticed significant improvement at this point but has had drowsiness. Has been walking on treadmill for exercise. Takes Vyvanse which greatly helps binge eating. Continues follow up with Dr. Tenny Craw.    Review of Systems     Objective:   Physical Exam NAD. Alert, oriented. Cheerful, mildly anxious affect. Making good eye contact. Dressed appropriately. Lungs clear. Heart RRR.        Assessment & Plan:   Problem List Items Addressed This Visit      Other   Binge eating disorder - Primary     Meds ordered this encounter  Medications  . DISCONTD: lisdexamfetamine (VYVANSE) 30 MG capsule    Sig: TAKE ONE CAPSULE BY MOUTHONCE DAILY.    Dispense:  30 capsule    Refill:  0    Order Specific Question:   Supervising Provider    Answer:   Lilyan Punt A [[id number]]  . DISCONTD: lisdexamfetamine (VYVANSE) 30 MG capsule    Sig: TAKE ONE CAPSULE BY MOUTHONCE DAILY.    Dispense:  30 capsule    Refill:  0    May fill 30 days from 08/31/19    Order Specific Question:   Supervising Provider    Answer:   Lilyan Punt A [[id number]]  . lisdexamfetamine (VYVANSE) 30 MG capsule    Sig: TAKE ONE CAPSULE BY MOUTHONCE DAILY.    Dispense:  30 capsule    Refill:  0    May fill 60 days from 08/31/19    Order Specific Question:   Supervising Provider    Answer:   Lilyan Punt A [[id number]]   Call back if drowsiness persists on Cymbalta.  Continue follow up with Dr. Tenny Craw. Reminded about wellness exam. Return in about 3 months (around 11/30/2019) for Binge eating disorder.

## 2019-08-31 ENCOUNTER — Encounter: Payer: Self-pay | Admitting: Nurse Practitioner

## 2019-08-31 MED ORDER — LISDEXAMFETAMINE DIMESYLATE 30 MG PO CAPS: ORAL_CAPSULE | ORAL | 0 refills | Status: DC

## 2019-09-10 ENCOUNTER — Other Ambulatory Visit: Payer: Self-pay

## 2019-09-10 ENCOUNTER — Encounter (HOSPITAL_COMMUNITY): Payer: Self-pay | Admitting: Psychiatry

## 2019-09-10 ENCOUNTER — Telehealth (INDEPENDENT_AMBULATORY_CARE_PROVIDER_SITE_OTHER): Payer: BC Managed Care – PPO | Admitting: Psychiatry

## 2019-09-10 DIAGNOSIS — F320 Major depressive disorder, single episode, mild: Secondary | ICD-10-CM

## 2019-09-10 MED ORDER — BUPROPION HCL ER (SR) 150 MG PO TB12: 150.0000 mg | ORAL_TABLET | Freq: Two times a day (BID) | ORAL | 5 refills | Status: DC

## 2019-09-10 MED ORDER — LISDEXAMFETAMINE DIMESYLATE 40 MG PO CAPS
40.0000 mg | ORAL_CAPSULE | ORAL | 0 refills | Status: DC
Start: 2019-09-10 — End: 2019-12-18

## 2019-09-10 MED ORDER — LISDEXAMFETAMINE DIMESYLATE 40 MG PO CAPS: 40.0000 mg | ORAL_CAPSULE | ORAL | 0 refills | Status: DC

## 2019-09-10 NOTE — Progress Notes (Signed)
Virtual Visit via Telephone Note  I connected with Sheri Wood on 09/10/19 at  3:20 PM EDT by telephone and verified that I am speaking with the correct person using two identifiers.   I discussed the limitations, risks, security and privacy concerns of performing an evaluation and management service by telephone and the availability of in person appointments. I also discussed with the patient that there may be a patient responsible charge related to this service. The patient expressed understanding and agreed to proceed.    I discussed the assessment and treatment plan with the patient. The patient was provided an opportunity to ask questions and all were answered. The patient agreed with the plan and demonstrated an understanding of the instructions.   The patient was advised to call back or seek an in-person evaluation if the symptoms worsen or if the condition fails to improve as anticipated.  I provided 15 minutes of non-face-to-face time during this encounter. Location: Provider Home, patient home  Diannia Ruder, MD  [facility] MD/PA/NP OP Progress Note  09/10/2019 3:42 PM AMAYAH Wood  MRN:  [id number]  Chief Complaint:  Chief Complaint    Anxiety; Depression; Follow-up     HPI: This patient is a 49 year old married white female who lives with her husband in [place].  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  The patient returns for follow-up after 3 months.  She is followed for depression ADD and binge eating disorder.  Last month her primary physician Dr. Gerda Diss changed her from Wellbutrin to Cymbalta because of her rheumatoid arthritis symptoms.  Her rheumatoid factor is not positive but her ANA has been on her rheumatologist is sending out more testing.  She is having a lot of pain in all of her joints.  She is only on Cymbalta 30 mg and has not seen much change yet.  However the medicine is making her very drowsy.  She states that she felt  better with the Butrans I mentioned that you can actually take both together and she would like to try this.  She is also taking the Vyvanse 30 mg for binge eating disorder and does not see much change in appetite so we will increase it just a little bit with.  When she is good at the high end of the dosage range she has had palpitations.  She still is sad at times and misses her mother but for the most part her mood has been stable Visit Diagnosis:    ICD-10-CM   1. Mild single current episode of major depressive disorder (HCC)  F32.0     Past Psychiatric History: Long-term outpatient treatment for depression and anxiety  Past Medical History:  Past Medical History:  Diagnosis Date  . Allergy    seasonal  . Anxiety   . Depression   . Fibromyalgia   . Gastritis    history  . GERD (gastroesophageal reflux disease)    otc med prn  . IBS (irritable bowel syndrome)   . Migraine   . SVD (spontaneous vaginal delivery)    x 2  . Urticaria, chronic   . Vertigo     Past Surgical History:  Procedure Laterality Date  . COLONOSCOPY  2008   negative  . DIAGNOSTIC LAPAROSCOPY     fibroids  . DILATION AND CURETTAGE OF UTERUS    . DILITATION & CURRETTAGE/HYSTROSCOPY WITH NOVASURE ABLATION N/A 05/06/2013   Procedure: DILATATION & CURETTAGE/HYSTEROSCOPY WITH NOVASURE ABLATION;  Surgeon: Tresa Endo  Victor Bing, MD;  Location: [facility];  Service: Gynecology;  Laterality: N/A;  . EYE SURGERY     bilateral lasik  . FOOT SURGERY  2017  . LAPAROSCOPIC BILATERAL SALPINGECTOMY Bilateral 05/06/2013   Procedure: LAPAROSCOPIC BILATERAL SALPINGECTOMY;  Surgeon: Tresa Endo A. Ernestina Penna, MD;  Location: [facility];  Service: Gynecology;  Laterality: Bilateral;  . WISDOM TOOTH EXTRACTION      Family Psychiatric History: see below  Family History:  Family History  Problem Relation Age of Onset  . Alcohol abuse Brother   . Drug abuse Brother   . Depression Mother   . Anxiety disorder Mother   . Anxiety disorder  Brother   . Depression Brother   . Bipolar disorder Maternal Uncle   . Anxiety disorder Paternal Grandfather   . Depression Paternal Grandfather   . Bipolar disorder Cousin     Social History:  Social History   Socioeconomic History  . Marital status: Married    Spouse name: Not on file  . Number of children: Not on file  . Years of education: Not on file  . Highest education level: Not on file  Occupational History  . Not on file  Tobacco Use  . Smoking status: Former Smoker    Packs/day: 0.75    Years: 10.00    Pack years: 7.50    Types: Cigarettes    Quit date: 02/08/1988    Years since quitting: 31.6  . Smokeless tobacco: Never Used  Substance and Sexual Activity  . Alcohol use: No  . Drug use: No  . Sexual activity: Yes    Birth control/protection: None  Other Topics Concern  . Not on file  Social History Narrative  . Not on file   Social Determinants of Health   Financial Resource Strain:   . Difficulty of Paying Living Expenses:   Food Insecurity:   . Worried About Programme researcher, broadcasting/film/video in the Last Year:   . Barista in the Last Year:   Transportation Needs:   . Freight forwarder (Medical):   [name] Lack of Transportation (Non-Medical):   Physical Activity:   . Days of Exercise per Week:   . Minutes of Exercise per Session:   Stress:   . Feeling of Stress :   Social Connections:   . Frequency of Communication with Friends and Family:   . Frequency of Social Gatherings with Friends and Family:   . Attends Religious Services:   . Active Member of Clubs or Organizations:   . Attends Banker Meetings:   [name] Marital Status:     Allergies:  Allergies  Allergen Reactions  . Ciprofloxacin Other (See Comments)    Severe muscle and joint pain  . Cefzil [Cefprozil]     possible hives  . Augmentin [Amoxicillin-Pot Clavulanate] Nausea And Vomiting    Throat felt funny and gave pt anxiety  . Bee Venom Swelling    Metabolic Disorder  Labs: No results found for: HGBA1C, MPG No results found for: PROLACTIN Lab Results  Component Value Date   CHOL 192 04/02/2015   TRIG 69 04/02/2015   HDL 60 04/02/2015   CHOLHDL 3.2 04/02/2015   VLDL 14 04/02/2015   LDLCALC 118 (H) 04/02/2015   Lab Results  Component Value Date   TSH 1.940 03/23/2015    Therapeutic Level Labs: No results found for: LITHIUM No results found for: VALPROATE No components found for:  CBMZ  Current Medications: Current Outpatient Medications  Medication Sig  Dispense Refill  . ALPRAZolam (XANAX) 1 MG tablet Take 1 tablet (1 mg total) by mouth daily as needed for anxiety. 30 tablet 2  . buPROPion (WELLBUTRIN SR) 150 MG 12 hr tablet Take 1 tablet (150 mg total) by mouth 2 (two) times daily. 60 tablet 5  . cyclobenzaprine (FLEXERIL) 10 MG tablet 1 qhs prn 30 tablet 3  . diazepam (VALIUM) 5 MG tablet TAKE 1 TABLET BY MOUTH EVERY 6 HOURS AS NEEDED. 30 tablet 3  . diclofenac (VOLTAREN) 75 MG EC tablet Take by mouth.    . DULoxetine (CYMBALTA) 30 MG capsule Take 1 capsule (30 mg total) by mouth daily. 30 capsule 3  . eletriptan (RELPAX) 40 MG tablet TAKE 1 TABLET BY MOUTH AT ONSET OF MIGRAINE, MAY REPEAT ONCE IN 2 HOURS LATER. NO MORE THAN 2 A DAY. 8 tablet 6  . lisdexamfetamine (VYVANSE) 30 MG capsule TAKE ONE CAPSULE BY MOUTHONCE DAILY. 30 capsule 0  . lisdexamfetamine (VYVANSE) 40 MG capsule Take 1 capsule (40 mg total) by mouth every morning. 30 capsule 0  . lisdexamfetamine (VYVANSE) 40 MG capsule Take 1 capsule (40 mg total) by mouth every morning. 30 capsule 0  . lisdexamfetamine (VYVANSE) 40 MG capsule Take 1 capsule (40 mg total) by mouth every morning. 30 capsule 0  . meclizine (ANTIVERT) 25 MG tablet Take 1 tablet (25 mg total) by mouth 2 (two) times daily as needed for dizziness. 30 tablet 0  . NON FORMULARY Taking clabetazol Cream twice a week    . pramipexole (MIRAPEX) 1.5 MG tablet Take 1 tablet (1.5 mg total) by mouth at bedtime. 30 tablet 5   . terconazole (TERAZOL 3) 0.8 % vaginal cream UNWRAP AND INSERT (1) APPLICATOR VAGINALLY AT BEDTIME FOR 3 DAYS. 20 g 0   No current facility-administered medications for this visit.     Musculoskeletal: Strength & Muscle Tone: within normal limits Gait & Station: normal Patient leans: N/A  Psychiatric Specialty Exam: Review of Systems  Constitutional: Positive for fatigue.  Psychiatric/Behavioral: Positive for dysphoric mood.    There were no vitals taken for this visit.There is no height or weight on file to calculate BMI.  General Appearance: NA  Eye Contact:  NA  Speech:  Clear and Coherent  Volume:  Normal  Mood:  Anxious  Affect:  NA  Thought Process:  Goal Directed  Orientation:  Full (Time, Place, and Person)  Thought Content: WDL   Suicidal Thoughts:  No  Homicidal Thoughts:  No  Memory:  Immediate;   Good Recent;   Good Remote;   Good  Judgement:  Good  Insight:  Good  Psychomotor Activity:  Normal  Concentration:  Concentration: Good and Attention Span: Good  Recall:  Good  Fund of Knowledge: Good  Language: Good  Akathisia:  No  Handed:  Right  AIMS (if indicated): not done  Assets:  Communication Skills Desire for Improvement Physical Health Resilience Social Support Talents/Skills  ADL's:  Intact  Cognition: WNL  Sleep:  Good   Screenings: GAD-7     [facility]medicine from [facility]/07/2019 in [facility] Visit from 12/28/2017 in [facility]  Total GAD-7 Score 14 12    PHQ2-9     [facility]medicine [facility]07/2019 [facility] Visit from 12/28/2017 in [facility] Visit from 02/03/2017 in [facility]  PHQ-2 Total Score 2 3 3   PHQ-9 Total Score 11 9 7        Assessment and Plan: Patient  is a 49 year old female with a history of fibromyalgia, depression anxiety binge eating disorder and possibly new diagnosis of rheumatoid arthritis.  She still is struggling with the  eating I would like to go up a little higher on the Vyvanse so we will go to 40 mg daily.  Since she has fatigue and lower mood with the Cymbalta we will reintroduce the Wellbutrin SR 150 mg daily for depression along with Cymbalta 30 mg daily for depression and chronic pain.  She rarely uses the Xanax.  She will return to see me in 3 months or call sooner as needed   Diannia Ruder, MD 09/10/2019, 3:42 PM

## 2019-09-16 ENCOUNTER — Encounter: Payer: Self-pay | Admitting: Family Medicine

## 2019-09-16 MED ORDER — TERCONAZOLE 0.8 % VA CREA: 1.0000 | TOPICAL_CREAM | Freq: Every day | VAGINAL | 0 refills | Status: AC

## 2019-09-17 ENCOUNTER — Telehealth (HOSPITAL_COMMUNITY): Payer: BC Managed Care – PPO | Admitting: Psychiatry

## 2019-09-24 ENCOUNTER — Telehealth: Payer: BC Managed Care – PPO | Admitting: Emergency Medicine

## 2019-09-24 DIAGNOSIS — N3000 Acute cystitis without hematuria: Secondary | ICD-10-CM | POA: Diagnosis not present

## 2019-09-24 MED ORDER — NITROFURANTOIN MONOHYD MACRO 100 MG PO CAPS
100.0000 mg | ORAL_CAPSULE | Freq: Two times a day (BID) | ORAL | 0 refills | Status: AC
Start: 2019-09-24 — End: 2019-09-29

## 2019-09-24 NOTE — Progress Notes (Signed)

## 2019-09-25 ENCOUNTER — Encounter: Payer: Self-pay | Admitting: Family Medicine

## 2019-09-25 NOTE — Telephone Encounter (Signed)
  Pt needs to come in to leave a urine and get a culture.  2 e-visit and not improving means pt needs to get there urine evaluated.

## 2019-10-05 ENCOUNTER — Ambulatory Visit
Admission: RE | Admit: 2019-10-05 | Discharge: 2019-10-05 | Disposition: A | Discharge status: Home / Self Care | Payer: BC Managed Care – PPO | Source: Ambulatory Visit | Attending: Emergency Medicine | Admitting: Emergency Medicine

## 2019-10-05 ENCOUNTER — Other Ambulatory Visit: Payer: Self-pay

## 2019-10-05 VITALS — BP 112/73 | HR 70 | Temp 99.0°F | Resp 18 | Ht 67.0 in | Wt 184.0 lb

## 2019-10-05 DIAGNOSIS — H6592 Unspecified nonsuppurative otitis media, left ear: Secondary | ICD-10-CM | POA: Diagnosis present

## 2019-10-05 DIAGNOSIS — R300 Dysuria: Secondary | ICD-10-CM | POA: Insufficient documentation

## 2019-10-05 LAB — POCT URINALYSIS DIP (MANUAL ENTRY)
Bilirubin, UA: NEGATIVE
Glucose, UA: NEGATIVE mg/dL
Leukocytes, UA: NEGATIVE
Nitrite, UA: NEGATIVE
Spec Grav, UA: 1.02 (ref 1.010–1.025)
Urobilinogen, UA: 0.2 E.U./dL
pH, UA: 6 (ref 5.0–8.0)

## 2019-10-05 MED ORDER — FLUCONAZOLE 200 MG PO TABS: 200.0000 mg | ORAL_TABLET | Freq: Once | ORAL | 0 refills | Status: AC

## 2019-10-05 MED ORDER — SULFAMETHOXAZOLE-TRIMETHOPRIM 800-160 MG PO TABS: 1.0000 | ORAL_TABLET | Freq: Two times a day (BID) | ORAL | 0 refills | Status: AC

## 2019-10-05 MED ORDER — TERCONAZOLE 0.4 % VA CREA: 1.0000 | TOPICAL_CREAM | Freq: Every day | VAGINAL | 0 refills | Status: DC

## 2019-10-05 NOTE — Discharge Instructions (Addendum)
Urine culture sent.  We will call you with the results.   Push fluids and get plenty of rest.   Take antibiotic as directed and to completion Continue to take OTC Flonase for left middle ear effusion Follow up with PCP if symptoms persists Return here or go to ER if you have any new or worsening symptoms such as fever, worsening abdominal pain, nausea/vomiting, flank pain, etc.

## 2019-10-05 NOTE — ED Triage Notes (Signed)
Pt has been on 2 rounds of abx for uti recently, also reports poss ear infection to RT ear

## 2019-10-05 NOTE — ED Provider Notes (Signed)
[facility]   CC: Burning with urination  SUBJECTIVE:  Sheri Wood is a 49 y.o. female who presented to the urgent care with a complaint of dysuria for the past few days.  Was seen by PCP and was prescribed Macrobid.  Has completed antibiotic course with no symptom improvement.  Patient denies a precipitating event, recent sexual encounter, excessive caffeine intake.  Symptoms are made worse with urination.  Admits to similar symptoms in the past.  Denies fever, chills, nausea, vomiting, abdominal pain, flank pain, abnormal vaginal discharge or bleeding, hematuria.    She is also complaining of right ear pain or possible infection.  Has not tried any medication.  Denies any precipitating event.  Denies similar symptoms in the past.  LMP: Patient's last menstrual period was 06/25/2019.  ROS: As in HPI.  All other pertinent ROS negative.     Past Medical History:  Diagnosis Date  . Allergy    seasonal  . Anxiety   . Depression   . Fibromyalgia   . Gastritis    history  . GERD (gastroesophageal reflux disease)    otc med prn  . IBS (irritable bowel syndrome)   . Migraine   . SVD (spontaneous vaginal delivery)    x 2  . Urticaria, chronic   . Vertigo    Past Surgical History:  Procedure Laterality Date  . COLONOSCOPY  2008   negative  . DIAGNOSTIC LAPAROSCOPY     fibroids  . DILATION AND CURETTAGE OF UTERUS    . DILITATION & CURRETTAGE/HYSTROSCOPY WITH NOVASURE ABLATION N/A 05/06/2013   Procedure: DILATATION & CURETTAGE/HYSTEROSCOPY WITH NOVASURE ABLATION;  Surgeon: Alphonsus Sias. Ernestina Penna, MD;  Location: [facility];  Service: Gynecology;  Laterality: N/A;  . EYE SURGERY     bilateral lasik  . FOOT SURGERY  2017  . LAPAROSCOPIC BILATERAL SALPINGECTOMY Bilateral 05/06/2013   Procedure: LAPAROSCOPIC BILATERAL SALPINGECTOMY;  Surgeon: Tresa Endo A. Ernestina Penna, MD;  Location: [facility];  Service: Gynecology;  Laterality: Bilateral;  . WISDOM TOOTH EXTRACTION     Allergies   Allergen Reactions  . Ciprofloxacin Other (See Comments)    Severe muscle and joint pain  . Cefzil [Cefprozil]     possible hives  . Augmentin [Amoxicillin-Pot Clavulanate] Nausea And Vomiting    Throat felt funny and gave pt anxiety  . Bee Venom Swelling   No current facility-administered medications on file prior to encounter.   Current Outpatient Medications on File Prior to Encounter  Medication Sig Dispense Refill  . ALPRAZolam (XANAX) 1 MG tablet Take 1 tablet (1 mg total) by mouth daily as needed for anxiety. 30 tablet 2  . buPROPion (WELLBUTRIN SR) 150 MG 12 hr tablet Take 1 tablet (150 mg total) by mouth 2 (two) times daily. 60 tablet 5  . cyclobenzaprine (FLEXERIL) 10 MG tablet 1 qhs prn 30 tablet 3  . diazepam (VALIUM) 5 MG tablet TAKE 1 TABLET BY MOUTH EVERY 6 HOURS AS NEEDED. 30 tablet 3  . diclofenac (VOLTAREN) 75 MG EC tablet Take by mouth.    . DULoxetine (CYMBALTA) 30 MG capsule Take 1 capsule (30 mg total) by mouth daily. 30 capsule 3  . eletriptan (RELPAX) 40 MG tablet TAKE 1 TABLET BY MOUTH AT ONSET OF MIGRAINE, MAY REPEAT ONCE IN 2 HOURS LATER. NO MORE THAN 2 A DAY. 8 tablet 6  . lisdexamfetamine (VYVANSE) 30 MG capsule TAKE ONE CAPSULE BY MOUTHONCE DAILY. 30 capsule 0  . lisdexamfetamine (VYVANSE) 40 MG capsule Take 1 capsule (  40 mg total) by mouth every morning. 30 capsule 0  . lisdexamfetamine (VYVANSE) 40 MG capsule Take 1 capsule (40 mg total) by mouth every morning. 30 capsule 0  . lisdexamfetamine (VYVANSE) 40 MG capsule Take 1 capsule (40 mg total) by mouth every morning. 30 capsule 0  . meclizine (ANTIVERT) 25 MG tablet Take 1 tablet (25 mg total) by mouth 2 (two) times daily as needed for dizziness. 30 tablet 0  . NON FORMULARY Taking clabetazol Cream twice a week    . pramipexole (MIRAPEX) 1.5 MG tablet Take 1 tablet (1.5 mg total) by mouth at bedtime. 30 tablet 5   Social History   Socioeconomic History  . Marital status: Married    Spouse name: Not  on file  . Number of children: Not on file  . Years of education: Not on file  . Highest education level: Not on file  Occupational History  . Not on file  Tobacco Use  . Smoking status: Former Smoker    Packs/day: 0.75    Years: 10.00    Pack years: 7.50    Types: Cigarettes    Quit date: 02/08/1988    Years since quitting: 31.6  . Smokeless tobacco: Never Used  Substance and Sexual Activity  . Alcohol use: No  . Drug use: No  . Sexual activity: Yes    Birth control/protection: None  Other Topics Concern  . Not on file  Social History Narrative  . Not on file   Social Determinants of Health   Financial Resource Strain:   . Difficulty of Paying Living Expenses: Not on file  Food Insecurity:   . Worried About Programme researcher, broadcasting/film/video in the Last Year: Not on file  . Ran Out of Food in the Last Year: Not on file  Transportation Needs:   . Lack of Transportation (Medical): Not on file  . Lack of Transportation (Non-Medical): Not on file  Physical Activity:   . Days of Exercise per Week: Not on file  . Minutes of Exercise per Session: Not on file  Stress:   . Feeling of Stress : Not on file  Social Connections:   . Frequency of Communication with Friends and Family: Not on file  . Frequency of Social Gatherings with Friends and Family: Not on file  . Attends Religious Services: Not on file  . Active Member of Clubs or Organizations: Not on file  . Attends Banker Meetings: Not on file  . Marital Status: Not on file  Intimate Partner Violence:   . Fear of Current or Ex-Partner: Not on file  . Emotionally Abused: Not on file  . Physically Abused: Not on file  . Sexually Abused: Not on file   Family History  Problem Relation Age of Onset  . Alcohol abuse Brother   . Drug abuse Brother   . Depression Mother   . Anxiety disorder Mother   . Anxiety disorder Brother   . Depression Brother   . Bipolar disorder Maternal Uncle   . Anxiety disorder Paternal  Grandfather   . Depression Paternal Grandfather   . Bipolar disorder Cousin     OBJECTIVE:  Vitals:   10/05/19 1110 10/05/19 1112  BP:  112/73  Pulse:  70  Resp:  18  Temp:  99 F (37.2 C)  TempSrc:  Oral  SpO2:  99%  Weight: 184 lb (83.5 kg)   Height: 5\' 7"  (1.702 m)    General appearance: AOx3 in no acute  distress HEENT: NCAT.  Oropharynx clear. Ear: Right ear TM within normal limits, left ear TM with middle ear effusion Lungs: clear to auscultation bilaterally without adventitious breath sounds Heart: regular rate and rhythm.  Radial pulses 2+ symmetrical bilaterally Abdomen: soft; non-distended; no tenderness; bowel sounds present; no guarding or rebound tenderness Back: no CVA tenderness Extremities: no edema; symmetrical with no gross deformities Skin: warm and dry Neurologic: Ambulates from chair to exam table without difficulty Psychological: alert and cooperative; normal mood and affect  Labs Reviewed  POCT URINALYSIS DIP (MANUAL ENTRY) - Abnormal; Notable for the following components:      Result Value   Ketones, POC UA trace (5) (*)    Blood, UA trace-intact (*)    Protein Ur, POC trace (*)    All other components within normal limits  URINE CULTURE    ASSESSMENT & PLAN:  1. Dysuria   2. Middle ear effusion, left     Meds ordered this encounter  Medications  . sulfamethoxazole-trimethoprim (BACTRIM DS) 800-160 MG tablet    Sig: Take 1 tablet by mouth 2 (two) times daily for 7 days.    Dispense:  14 tablet    Refill:  0  . fluconazole (DIFLUCAN) 200 MG tablet    Sig: Take 1 tablet (200 mg total) by mouth once for 1 dose. May take a second dose 72 hours after the first dose if symptom does not resolve    Dispense:  2 tablet    Refill:  0  . terconazole (TERAZOL 7) 0.4 % vaginal cream    Sig: Place 1 applicator vaginally at bedtime.    Dispense:  45 g    Refill:  0   Patient stable at discharge.  Urine analysis was inconclusive for UTI.  She has  been treated multiple times for UTI with no symptom resolution.  Will prescribe Bactrim DS.  Will await urine culture.  Diflucan and terconazole was prescribed to prevent yeast infection.  Discharge Instructions Urine culture sent.  We will call you with the results.   Push fluids and get plenty of rest.   Take antibiotic as directed and to completion Follow up with PCP if symptoms persists Return here or go to ER if you have any new or worsening symptoms such as fever, worsening abdominal pain, nausea/vomiting, flank pain, etc...  Outlined signs and symptoms indicating need for more acute intervention. Patient verbalized understanding. After Visit Summary given.  Note: This document was prepared using Dragon voice recognition software and may include unintentional dictation errors.    Durward Parcel, FNP 10/05/19 1155

## 2019-10-06 LAB — URINE CULTURE: Culture: <10000 — AB

## 2019-10-17 ENCOUNTER — Other Ambulatory Visit: Payer: Self-pay

## 2019-10-17 ENCOUNTER — Telehealth: Payer: Self-pay | Admitting: *Deleted

## 2019-10-17 ENCOUNTER — Encounter: Payer: Self-pay | Admitting: Family Medicine

## 2019-10-17 ENCOUNTER — Telehealth (INDEPENDENT_AMBULATORY_CARE_PROVIDER_SITE_OTHER): Payer: BC Managed Care – PPO | Admitting: Family Medicine

## 2019-10-17 DIAGNOSIS — G43009 Migraine without aura, not intractable, without status migrainosus: Secondary | ICD-10-CM

## 2019-10-17 MED ORDER — ONDANSETRON HCL 8 MG PO TABS: 8.0000 mg | ORAL_TABLET | Freq: Three times a day (TID) | ORAL | 0 refills | Status: DC | PRN

## 2019-10-17 MED ORDER — VALPROIC ACID 250 MG PO CAPS
250.0000 mg | ORAL_CAPSULE | Freq: Three times a day (TID) | ORAL | 5 refills | Status: DC
Start: 2019-10-17 — End: 2019-12-20

## 2019-10-17 MED ORDER — TIZANIDINE HCL 2 MG PO TABS: 2.0000 mg | ORAL_TABLET | Freq: Three times a day (TID) | ORAL | 1 refills | Status: DC | PRN

## 2019-10-17 MED ORDER — PROMETHAZINE HCL 25 MG PO TABS: ORAL_TABLET | ORAL | 1 refills | Status: DC

## 2019-10-17 NOTE — Telephone Encounter (Signed)
Ms. leen, tworek are scheduled for a virtual visit with your provider today.    Just as we do with appointments in the office, we must obtain your consent to participate.  Your consent will be active for this visit and any virtual visit you may have with one of our providers in the next 365 days.    If you have a MyChart account, I can also send a copy of this consent to you electronically.  All virtual visits are billed to your insurance company just like a traditional visit in the office.  As this is a virtual visit, video technology does not allow for your provider to perform a traditional examination.  This may limit your provider's ability to fully assess your condition.  If your provider identifies any concerns that need to be evaluated in person or the need to arrange testing such as labs, EKG, etc, we will make arrangements to do so.    Although advances in technology are sophisticated, we cannot ensure that it will always work on either your end or our end.  If the connection with a video visit is poor, we may have to switch to a telephone visit.  With either a video or telephone visit, we are not always able to ensure that we have a secure connection.   I need to obtain your verbal consent now.   Are you willing to proceed with your visit today?   MARILOUISE DENSMORE has provided verbal consent on 10/17/2019 for a virtual visit (video or telephone).   Haze Rushing, LPN 02/16/1476  2:95 PM

## 2019-10-17 NOTE — Progress Notes (Addendum)
Subjective:    Patient ID: Sheri Wood, female    DOB: 1970/12/09, 49 y.o.   MRN: [id number] Virtual Visit via Video Note  I connected with Sheri Wood on 11/03/19 at  2:00 PM EDT by a video enabled telemedicine application and verified that I am speaking with the correct person using two identifiers.  Location: Patient: Home Provider: Office   I discussed the limitations of evaluation and management by telemedicine and the availability of in person appointments. The patient expressed understanding and agreed to proceed.  History of Present Illness:    Observations/Objective:   Assessment and Plan:   Follow Up Instructions:    I discussed the assessment and treatment plan with the patient. The patient was provided an opportunity to ask questions and all were answered. The patient agreed with the plan and demonstrated an understanding of the instructions.   The patient was advised to call back or seek an in-person evaluation if the symptoms worsen or if the condition fails to improve as anticipated.  I provided 20 minutes of non-face-to-face time during this encounter.   Lilyan Punt, MD   HPI Patient calls in today with complaints of worsening and more frequent migraines over the last couple of weeks.  Reports having 3 per week that last up to a several days.   Patient with frequent migraines Several times per week Nothing seems to trigger Relates a lot of nausea occasional blurred vision denies double vision occasionally wakes her up at night but mainly they are during the day denies unilateral numbness weakness has a long history of migraines but worse over the past several weeks is under a lot of stress with her job.  Denies being depressed.  Review of Systems See above.    Objective:   Physical Exam Today's visit was via telephone Physical exam was not possible for this visit  Virtual Visit via Video Note  I connected with Sheri Wood on  10/28/19 at  2:00 PM EDT by a video enabled telemedicine application and verified that I am speaking with the correct person using two identifiers.  Location: Patient: Home Provider: Office   I discussed the limitations of evaluation and management by telemedicine and the availability of in person appointments. The patient expressed understanding and agreed to proceed.  History of Present Illness:    Observations/Objective:   Assessment and Plan:   Follow Up Instructions:    I discussed the assessment and treatment plan with the patient. The patient was provided an opportunity to ask questions and all were answered. The patient agreed with the plan and demonstrated an understanding of the instructions.   The patient was advised to call back or seek an in-person evaluation if the symptoms worsen or if the condition fails to improve as anticipated.  I provided 20 minutes of non-face-to-face time during this encounter.   Lilyan Punt, MD        Assessment & Plan:  Frequent migraines  We discussed today several different aspects For the acute migraine She may continue Relpax and if needed use ibuprofen as a one-time dose.  If she is experiencing significant nausea Phenergan can be helpful but only when at home.  Zofran may be used when at work and suffering with nausea. It is okay to utilize Zanaflex muscle relaxer if neck muscles are very tight contributing to the headache.  But remember Zanaflex muscle relaxer-tizanidine-is for home use only  As for preventing migraines it is best to be  on a daily medication.  As we have discussed with the patient she did not tolerate Topamax well because of cognitive issues.  Therefore we will try Depakote-valproic acid-start off 1 daily for 4 days then 1 twice daily.  If not seeing significant decline in the frequency of the headaches then next step after 2 weeks would be going to 3/day  Sheri Wood stated that she will send [place] a MyChart  update in 2 weeks time and she will do a follow-up visit which can be virtual in 6 weeks time  We did discuss injectable medications for her migraines as well as potential referral to neurology for Botox injections  Patient will keep a headache diary.  If headaches worsen or not seeing improvement MRI to be considered as completeness to the work-up  Patient was encouraged to notify us if any questions

## 2019-11-22 ENCOUNTER — Ambulatory Visit: Payer: BC Managed Care – PPO | Admitting: Nurse Practitioner

## 2019-11-24 ENCOUNTER — Other Ambulatory Visit: Payer: Self-pay | Admitting: Family Medicine

## 2019-11-25 ENCOUNTER — Telehealth: Payer: Self-pay

## 2019-11-25 MED ORDER — DICLOFENAC SODIUM 75 MG PO TBEC
75.0000 mg | DELAYED_RELEASE_TABLET | Freq: Two times a day (BID) | ORAL | 0 refills | Status: DC
Start: 2019-11-25 — End: 2020-05-07

## 2019-11-25 MED ORDER — ELETRIPTAN HYDROBROMIDE 40 MG PO TABS
ORAL_TABLET | ORAL | 0 refills | Status: DC
Start: 2019-11-25 — End: 2021-05-15

## 2019-11-25 NOTE — Telephone Encounter (Signed)
Needs a video visit to f/u on her last visit with dr. Lorin Picket in 10/17/19.  They started new medication.  Need to see how that's going and review her other medications that might be causing increase in headaches.   Thx.   Dr. Ladona Ridgel

## 2019-11-25 NOTE — Telephone Encounter (Signed)
Patient notified

## 2019-11-25 NOTE — Telephone Encounter (Signed)
Did pt not improve with the medications given by Dr. Lorin Picket?  How often is she having migraines?  Thanks,   Dr. Ladona Ridgel

## 2019-11-25 NOTE — Telephone Encounter (Signed)
Patient reports having an appointment with rheumatologist 11/1. States the diclofenac is helping some but she did not start taking the new migraine medication because of side effects. She has been managing with those and is somewhat better. She was sent to the front to schedule appointment.

## 2019-11-25 NOTE — Telephone Encounter (Signed)
Patient is requesting refill on eletriptan 40 mg,diclofenac 75 mg called into [place]

## 2019-11-27 ENCOUNTER — Telehealth: Payer: Self-pay | Admitting: Family Medicine

## 2019-11-27 ENCOUNTER — Telehealth: Payer: Self-pay

## 2019-11-27 ENCOUNTER — Other Ambulatory Visit: Payer: Self-pay

## 2019-11-27 ENCOUNTER — Telehealth (INDEPENDENT_AMBULATORY_CARE_PROVIDER_SITE_OTHER): Payer: BC Managed Care – PPO | Admitting: Family Medicine

## 2019-11-27 DIAGNOSIS — G43009 Migraine without aura, not intractable, without status migrainosus: Secondary | ICD-10-CM

## 2019-11-27 NOTE — Telephone Encounter (Signed)
Patient returning call to the nurse. °

## 2019-11-27 NOTE — Telephone Encounter (Signed)
Ms. shabnam, ladd are scheduled for a virtual visit with your provider today.    Just as we do with appointments in the office, we must obtain your consent to participate.  Your consent will be active for this visit and any virtual visit you may have with one of our providers in the next 365 days.    If you have a MyChart account, I can also send a copy of this consent to you electronically.  All virtual visits are billed to your insurance company just like a traditional visit in the office.  As this is a virtual visit, video technology does not allow for your provider to perform a traditional examination.  This may limit your provider's ability to fully assess your condition.  If your provider identifies any concerns that need to be evaluated in person or the need to arrange testing such as labs, EKG, etc, we will make arrangements to do so.    Although advances in technology are sophisticated, we cannot ensure that it will always work on either your end or our end.  If the connection with a video visit is poor, we may have to switch to a telephone visit.  With either a video or telephone visit, we are not always able to ensure that we have a secure connection.   I need to obtain your verbal consent now.   Are you willing to proceed with your visit today?   IVRY PIGUE has provided verbal consent on 11/27/2019 for a virtual visit (video or telephone).   Marlowe Shores, LPN 07/31/7626  9:53 AM

## 2019-11-27 NOTE — Telephone Encounter (Signed)
Pt has appt today

## 2019-11-27 NOTE — Progress Notes (Signed)
Patient ID: Sheri Wood, female    DOB: 1970/10/18, 49 y.o.   MRN: [id number]  Virtual Visit via Telephone Note  I connected with Sheri Wood on 11/27/19 at 10:30 AM EDT by telephone and verified that I am speaking with the correct person using two identifiers.  Location: Patient: home Provider: office   I discussed the limitations, risks, security and privacy concerns of performing an evaluation and management service by telephone and the availability of in person appointments. I also discussed with the patient that there may be a patient responsible charge related to this service. The patient expressed understanding and agreed to proceed.   Chief Complaint  Patient presents with  . Migraine   Subjective:    HPI Migraines has improved, from before in the summer. Had a f/u on migraines with Dr. Lorin Picket in 9/21. Tried the valproic acid from Dr. Lorin Picket on last visit in 9/21. Working and stressful at times with school starting. On spirntec bcp trying to help with periods.  Went back to work 2-4x per week having migraine.   Hasn't had migraine in a few weeks. relpax is what she has been on.   Seems come intermittent and frequently. Around season changes triggers them.  Seeing rheumatologist and having enbrel injections.  Seronegative with positive ANA.  They felt she might have rheumatoid arthritis. Pt on diclofenac.  Years ago saw a neurologist for migraines. Then they moved, then didn't like the next one. So came back to pcp and had pcp take back over the migraines.  Was given some mucsle relaxers. Per chart pt on 7 sedative type medications.  And pt stating didn't take zanaflex and also given phenergan.  Pt stating hasn't taken them. Also requested refills of valium, meclazine, and flexeril in the last 22months for different visits. Pt having lots of sedatives, flexeril, zanaflex, phenergan, valium, xanax, meclazine. Pt also on mirapex for RLS.  Pt getting  Xanax 1mg  tablets from psychiatrist, stating she doesn't like them, but has been continuing to get the refills.  Would rather have the valium 5mg  tablet from our office.   Medical History Arleigh has a past medical history of Allergy, Anxiety, Depression, Fibromyalgia, Gastritis, GERD (gastroesophageal reflux disease), IBS (irritable bowel syndrome), Migraine, SVD (spontaneous vaginal delivery), Urticaria, chronic, and Vertigo.   Outpatient Encounter Medications as of 11/27/2019  Medication Sig  . ALPRAZolam (XANAX) 1 MG tablet Take 1 tablet (1 mg total) by mouth daily as needed for anxiety.  Victorino Dike buPROPion (WELLBUTRIN SR) 150 MG 12 hr tablet Take 1 tablet (150 mg total) by mouth 2 (two) times daily.  . cyclobenzaprine (FLEXERIL) 10 MG tablet 1 qhs prn  . diazepam (VALIUM) 5 MG tablet TAKE 1 TABLET BY MOUTH EVERY 6 HOURS AS NEEDED.  11/29/2019 diclofenac (VOLTAREN) 75 MG EC tablet Take 1 tablet (75 mg total) by mouth 2 (two) times daily.  . DULoxetine (CYMBALTA) 30 MG capsule Take 1 capsule (30 mg total) by mouth daily.  [name] eletriptan (RELPAX) 40 MG tablet May repeat in 2 hours if headache persists or recurs.  [name] lisdexamfetamine (VYVANSE) 30 MG capsule TAKE ONE CAPSULE BY MOUTHONCE DAILY.  [name] lisdexamfetamine (VYVANSE) 40 MG capsule Take 1 capsule (40 mg total) by mouth every morning.  . lisdexamfetamine (VYVANSE) 40 MG capsule Take 1 capsule (40 mg total) by mouth every morning.  . lisdexamfetamine (VYVANSE) 40 MG capsule Take 1 capsule (40 mg total) by mouth every morning.  . meclizine (ANTIVERT) 25 MG  tablet Take 1 tablet (25 mg total) by mouth 2 (two) times daily as needed for dizziness.  . NON FORMULARY Taking clabetazol Cream twice a week  . ondansetron (ZOFRAN) 8 MG tablet Take 1 tablet (8 mg total) by mouth every 8 (eight) hours as needed for nausea.  . pramipexole (MIRAPEX) 1.5 MG tablet Take 1 tablet (1.5 mg total) by mouth at bedtime.  . promethazine (PHENERGAN) 25 MG tablet 1/2 to 1 q 8 hours  prn nausea/migraine  . terconazole (TERAZOL 7) 0.4 % vaginal cream Place 1 applicator vaginally at bedtime.  [name] tiZANidine (ZANAFLEX) 2 MG tablet Take 1 tablet (2 mg total) by mouth every 8 (eight) hours as needed for muscle spasms.  [name] valproic acid (DEPAKENE) 250 MG capsule Take 1 capsule (250 mg total) by mouth 3 (three) times daily.   No facility-administered encounter medications on file as of 11/27/2019.     Review of Systems  Constitutional: Negative for chills and fever.  HENT: Negative for congestion, rhinorrhea and sore throat.   Respiratory: Negative for cough, shortness of breath and wheezing.   Cardiovascular: Negative for chest pain and leg swelling.  Gastrointestinal: Negative for abdominal pain, diarrhea, nausea and vomiting.  Genitourinary: Negative for dysuria and frequency.  Musculoskeletal: Negative for arthralgias and back pain.  Skin: Negative for rash.  Neurological: Positive for headaches (improving). Negative for dizziness and weakness.     Vitals There were no vitals taken for this visit.  Objective:   Physical Exam  No PE due to phone visit.  Assessment and Plan   1. Migraine without aura and without status migrainosus, not intractable   Pt has h/o binge eating disorder in 2017, weight 195-197, 2019 183lbs. now at 184.  Per chart has been on this since 2016.   Needing to discuss tapering and discontinuing some of these. Pt on vyvanse daily and may be causing some of her insomnia, and headaches.  Pt upset that she isn't going to be continuing some of the medications.  I wanted to discuss the concerns of several sedative medications and pt got very upset.  Pt stating she isn't a "junkie" and isn't seeing multiple providers however, she is seen pcp for meds, psychiatry, and rheumatology. I felt she need to see neurology for her migraines that are not controlled.  Pt seemed receptive to this, but upset that I mentioned that she is on about 7 sedative  medications per the chart and started having all these refills since June '21. Pt very upset and started getting belligerent and I told her if we don't have a therapeutic relationship then she can see another pcp.   Pt will be dismissed for the behavior on the phone and I told the pt we don't have a therapeutic relationship and it would be best for her to find another provider.  Pt yelling on the phone, "she will"and that "who did I think I was coming in young and taking over and thinking I'm able to just change people's medications."    I told pt that I was "disconnecting the phone call" since unable to get a word in and pt continuing to yell.    Called Shannon, Manager and let her know about the interaction and pt needing to be dismissed.   Pt received refill of relpax yesterday, pt to get 30 days of emergent care and will need to find another provider.     Follow Up Instructions:    I discussed the assessment and treatment  plan with the patient. The patient was provided an opportunity to ask questions and all were answered. The patient agreed with the plan and demonstrated an understanding of the instructions.   The patient was advised to call back or seek an in-person evaluation if the symptoms worsen or if the condition fails to improve as anticipated.  I provided 15 minutes of non-face-to-face time during this encounter with charting, discussion with manager.

## 2019-12-18 ENCOUNTER — Encounter (HOSPITAL_COMMUNITY): Payer: Self-pay | Admitting: Psychiatry

## 2019-12-18 ENCOUNTER — Other Ambulatory Visit: Payer: Self-pay

## 2019-12-18 ENCOUNTER — Telehealth (INDEPENDENT_AMBULATORY_CARE_PROVIDER_SITE_OTHER): Payer: BC Managed Care – PPO | Admitting: Psychiatry

## 2019-12-18 DIAGNOSIS — F320 Major depressive disorder, single episode, mild: Secondary | ICD-10-CM | POA: Diagnosis not present

## 2019-12-18 DIAGNOSIS — F900 Attention-deficit hyperactivity disorder, predominantly inattentive type: Secondary | ICD-10-CM | POA: Diagnosis not present

## 2019-12-18 MED ORDER — BUPROPION HCL ER (SR) 150 MG PO TB12: 150.0000 mg | ORAL_TABLET | Freq: Two times a day (BID) | ORAL | 5 refills | Status: DC

## 2019-12-18 MED ORDER — LISDEXAMFETAMINE DIMESYLATE 40 MG PO CAPS
40.0000 mg | ORAL_CAPSULE | ORAL | 0 refills | Status: DC
Start: 2019-12-18 — End: 2020-02-04

## 2019-12-18 MED ORDER — DIAZEPAM 5 MG PO TABS
5.0000 mg | ORAL_TABLET | Freq: Four times a day (QID) | ORAL | 3 refills | Status: DC | PRN
Start: 2019-12-18 — End: 2020-05-18

## 2019-12-18 MED ORDER — FLUOXETINE HCL 20 MG PO CAPS: 20.0000 mg | ORAL_CAPSULE | Freq: Every day | ORAL | 2 refills | Status: DC

## 2019-12-18 NOTE — Progress Notes (Signed)
Virtual Visit via Telephone Note  I connected with Sheri Wood on 12/18/19 at  3:00 PM EST by telephone and verified that I am speaking with the correct person using two identifiers.  Location: Patient: home Provider: home   I discussed the limitations, risks, security and privacy concerns of performing an evaluation and management service by telephone and the availability of in person appointments. I also discussed with the patient that there may be a patient responsible charge related to this service. The patient expressed understanding and agreed to proceed.    I discussed the assessment and treatment plan with the patient. The patient was provided an opportunity to ask questions and all were answered. The patient agreed with the plan and demonstrated an understanding of the instructions.   The patient was advised to call back or seek an in-person evaluation if the symptoms worsen or if the condition fails to improve as anticipated.  I provided 15 minutes of non-face-to-face time during this encounter.   Diannia Rudereborah Jaimarie Rapozo, MD  [facility] MD/PA/NP OP Progress Note  12/18/2019 3:22 PM Sheri Wood  MRN:  [id number]  Chief Complaint:  Chief Complaint    Depression; Anxiety; ADD; Follow-up     HPI: This patient is a 49 year old married white female who lives [place] her [place] in [place].  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  The patient returns for follow-up after 3 months for depression ADD and binge eating disorder.  She states that she feels her depression and compulsive behaviors are getting worse.  She states that she is obsessing about a lot of things and is constantly checking to make sure she did not leave things on in the house and is repetitively doing things over and over.  She is also eating too much although the Vyvanse has helped cut this back somewhat.  She does not think Cymbalta was doing much for her so she has stopped it.  She still is  having chronic arthritic pain and recently saw a new rheumatologist.  She has not started any new medications yet as they are waiting the results of all the lab testing.  She thinks overall she did better with Prozac and asked if we could add some into her regimen I think this is reasonable.  She is also quite anxious in the Xanax makes her very groggy but she is able to tolerate Valium better so we will reinstate it. Visit Diagnosis:    ICD-10-CM   1. Mild single current episode of major depressive disorder (HCC)  F32.0   2. Attention deficit hyperactivity disorder (ADHD), predominantly inattentive type  F90.0     Past Psychiatric History: Long-term outpatient treatment for depression and anxiety  Past Medical History:  Past Medical History:  Diagnosis Date  . Allergy    seasonal  . Anxiety   . Depression   . Fibromyalgia   . Gastritis    history  . GERD (gastroesophageal reflux disease)    otc med prn  . IBS (irritable bowel syndrome)   . Migraine   . SVD (spontaneous vaginal delivery)    x 2  . Urticaria, chronic   . Vertigo     Past Surgical History:  Procedure Laterality Date  . COLONOSCOPY  2008   negative  . DIAGNOSTIC LAPAROSCOPY     fibroids  . DILATION AND CURETTAGE OF UTERUS    . DILITATION & CURRETTAGE/HYSTROSCOPY WITH NOVASURE ABLATION N/A 05/06/2013   Procedure: DILATATION & CURETTAGE/HYSTEROSCOPY WITH  NOVASURE ABLATION;  Surgeon: Alphonsus Sias. Ernestina Penna, MD;  Location: [facility];  Service: Gynecology;  Laterality: N/A;  . EYE SURGERY     bilateral lasik  . FOOT SURGERY  2017  . LAPAROSCOPIC BILATERAL SALPINGECTOMY Bilateral 05/06/2013   Procedure: LAPAROSCOPIC BILATERAL SALPINGECTOMY;  Surgeon: Tresa Endo A. Ernestina Penna, MD;  Location: [facility];  Service: Gynecology;  Laterality: Bilateral;  . WISDOM TOOTH EXTRACTION      Family Psychiatric History: see below  Family History:  Family History  Problem Relation Age of Onset  . Alcohol abuse Brother   . Drug abuse Brother    . Depression Mother   . Anxiety disorder Mother   . Anxiety disorder Brother   . Depression Brother   . Bipolar disorder Maternal Uncle   . Anxiety disorder Paternal Grandfather   . Depression Paternal Grandfather   . Bipolar disorder Cousin     Social History:  Social History   Socioeconomic History  . Marital status: Married    Spouse name: Not on file  . Number of children: Not on file  . Years of education: Not on file  . Highest education level: Not on file  Occupational History  . Not on file  Tobacco Use  . Smoking status: Former Smoker    Packs/day: 0.75    Years: 10.00    Pack years: 7.50    Types: Cigarettes    Quit date: 02/08/1988    Years since quitting: 31.8  . Smokeless tobacco: Never Used  Substance and Sexual Activity  . Alcohol use: No  . Drug use: No  . Sexual activity: Yes    Birth control/protection: None  Other Topics Concern  . Not on file  Social History Narrative  . Not on file   Social Determinants of Health   Financial Resource Strain:   . Difficulty of Paying Living Expenses: Not on file  Food Insecurity:   . Worried About Programme researcher, broadcasting/film/video in the Last Year: Not on file  . Ran Out of Food in the Last Year: Not on file  Transportation Needs:   . Lack of Transportation (Medical): Not on file  . Lack of Transportation (Non-Medical): Not on file  Physical Activity:   . Days of Exercise per Week: Not on file  . Minutes of Exercise per Session: Not on file  Stress:   . Feeling of Stress : Not on file  Social Connections:   . Frequency of Communication with Friends and Family: Not on file  . Frequency of Social Gatherings with Friends and Family: Not on file  . Attends Religious Services: Not on file  . Active Member of Clubs or Organizations: Not on file  . Attends Banker Meetings: Not on file  . Marital Status: Not on file    Allergies:  Allergies  Allergen Reactions  . Ciprofloxacin Other (See Comments)     Severe muscle and joint pain  . Cefzil [Cefprozil]     possible hives  . Augmentin [Amoxicillin-Pot Clavulanate] Nausea And Vomiting    Throat felt funny and gave pt anxiety  . Bee Venom Swelling    Metabolic Disorder Labs: No results found for: HGBA1C, MPG No results found for: PROLACTIN Lab Results  Component Value Date   CHOL 192 04/02/2015   TRIG 69 04/02/2015   HDL 60 04/02/2015   CHOLHDL 3.2 04/02/2015   VLDL 14 04/02/2015   LDLCALC 118 (H) 04/02/2015   Lab Results  Component Value Date   TSH  1.940 03/23/2015    Therapeutic Level Labs: No results found for: LITHIUM No results found for: VALPROATE No components found for:  CBMZ  Current Medications: Current Outpatient Medications  Medication Sig Dispense Refill  . buPROPion (WELLBUTRIN SR) 150 MG 12 hr tablet Take 1 tablet (150 mg total) by mouth 2 (two) times daily. 60 tablet 5  . cyclobenzaprine (FLEXERIL) 10 MG tablet 1 qhs prn 30 tablet 3  . diazepam (VALIUM) 5 MG tablet Take 1 tablet (5 mg total) by mouth every 6 (six) hours as needed. 30 tablet 3  . diclofenac (VOLTAREN) 75 MG EC tablet Take 1 tablet (75 mg total) by mouth 2 (two) times daily. 30 tablet 0  . eletriptan (RELPAX) 40 MG tablet May repeat in 2 hours if headache persists or recurs. 8 tablet 0  . FLUoxetine (PROZAC) 20 MG capsule Take 1 capsule (20 mg total) by mouth daily. 30 capsule 2  . lisdexamfetamine (VYVANSE) 40 MG capsule Take 1 capsule (40 mg total) by mouth every morning. 30 capsule 0  . lisdexamfetamine (VYVANSE) 40 MG capsule Take 1 capsule (40 mg total) by mouth every morning. 30 capsule 0  . lisdexamfetamine (VYVANSE) 40 MG capsule Take 1 capsule (40 mg total) by mouth every morning. 30 capsule 0  . meclizine (ANTIVERT) 25 MG tablet Take 1 tablet (25 mg total) by mouth 2 (two) times daily as needed for dizziness. 30 tablet 0  . NON FORMULARY Taking clabetazol Cream twice a week    . ondansetron (ZOFRAN) 8 MG tablet Take 1 tablet (8 mg  total) by mouth every 8 (eight) hours as needed for nausea. 20 tablet 0  . pramipexole (MIRAPEX) 1.5 MG tablet Take 1 tablet (1.5 mg total) by mouth at bedtime. 30 tablet 5  . promethazine (PHENERGAN) 25 MG tablet 1/2 to 1 q 8 hours prn nausea/migraine 20 tablet 1  . terconazole (TERAZOL 7) 0.4 % vaginal cream Place 1 applicator vaginally at bedtime. 45 g 0  . tiZANidine (ZANAFLEX) 2 MG tablet Take 1 tablet (2 mg total) by mouth every 8 (eight) hours as needed for muscle spasms. 30 tablet 1  . valproic acid (DEPAKENE) 250 MG capsule Take 1 capsule (250 mg total) by mouth 3 (three) times daily. 90 capsule 5   No current facility-administered medications for this visit.     Musculoskeletal: Strength & Muscle Tone: within normal limits Gait & Station: normal Patient leans: N/A  Psychiatric Specialty Exam: Review of Systems  Psychiatric/Behavioral: Positive for dysphoric mood. The patient is nervous/anxious.   All other systems reviewed and are negative.   There were no vitals taken for this visit.There is no height or weight on file to calculate BMI.  General Appearance: NA  Eye Contact:  NA  Speech:  Clear and Coherent  Volume:  Normal  Mood:  Anxious and Dysphoric  Affect:  NA  Thought Process:  Goal Directed  Orientation:  Full (Time, Place, and Person)  Thought Content: Obsessions and Rumination   Suicidal Thoughts:  No  Homicidal Thoughts:  No  Memory:  Immediate;   Good Recent;   Good Remote;   Good  Judgement:  Good  Insight:  Good  Psychomotor Activity:  Normal  Concentration:  Concentration: Good and Attention Span: Good  Recall:  Good  Fund of Knowledge: Good  Language: Good  Akathisia:  No  Handed:  Right  AIMS (if indicated): not done  Assets:  Communication Skills Desire for Improvement Resilience Social Support Talents/Skills  ADL's:  Intact  Cognition: WNL  Sleep:  Good   Screenings: GAD-7     Telemedicine from 06/07/2019 in [facility] Office Visit from 12/28/2017 in [facility]  Total GAD-7 Score 14 12    PHQ2-9     Telemedicine from 06/07/2019 in [facility] Office Visit from 12/28/2017 in [facility] Office Visit from 02/03/2017 in [facility]  PHQ-2 Total Score 2 3 3   PHQ-9 Total Score 11 9 7        Assessment and Plan: This patient is a 49 year old female with a history of fibromyalgia depression anxiety binge eating disorder and possible rheumatoid arthritis.  She is having more obsessional symptoms so we will add Prozac 20 mg daily in addition to the Wellbutrin SR 150 mg twice daily for depression.  She will discontinue Cymbalta.  She will continue Vyvanse 40 mg daily for ADD and binge eating disorder and start Valium 5 mg daily as needed for anxiety.  She will return to see me in 6 weeks or call sooner as needed   , MD 12/18/2019, 3:22 PM

## 2019-12-20 ENCOUNTER — Encounter: Payer: Self-pay | Admitting: Nurse Practitioner

## 2019-12-20 ENCOUNTER — Ambulatory Visit (INDEPENDENT_AMBULATORY_CARE_PROVIDER_SITE_OTHER): Payer: BC Managed Care – PPO | Admitting: Nurse Practitioner

## 2019-12-20 VITALS — BP 110/76 | HR 88 | Temp 97.9°F | Ht 67.0 in | Wt 189.0 lb

## 2019-12-20 DIAGNOSIS — G2581 Restless legs syndrome: Secondary | ICD-10-CM | POA: Diagnosis not present

## 2019-12-20 DIAGNOSIS — R5383 Other fatigue: Secondary | ICD-10-CM

## 2019-12-20 DIAGNOSIS — F418 Other specified anxiety disorders: Secondary | ICD-10-CM

## 2019-12-20 NOTE — Progress Notes (Signed)
   Subjective:    Patient ID: Sheri Wood, female    DOB: April 27, 1970, 49 y.o.   MRN: [id number]  HPI:med check up.   Takes meds for migraines and pt states they are better.   Possible side effects from mirapex. This has been going on for a while. Feels it is may be increasing her OCD, anxiety, and depression. This has occurred over the past year.  Just had an appointment with Dr. Tenny Craw (psych) and restarted Prozac a couple days ago.  Mirapex has been working well for the restless legs, has been taking it for 5+ years.    Review of Systems General: Denies fever, malaise  CV: Denies chest pain, palpitations, tachycardia Pulm: Denies cough, SOB, wheezing GI/GU: Denies upset stomach, nausea, vomiting, diarrhea, constipation, diarrhea. Denies urinary urgency, frequency, dysuria, pelvic or flank pain. Neuro: Reports headaches are much better now. Denies lightheadedness or dizziness.  Psych: Increased or worsening OCD symptoms, believes this is caused by the  Mirapex.    Objective:   Physical Exam   General: Patient is alert, well groomed, making good eye contact, with coherent thought and judgement. Tearful at times.  CV: Hearts sounds are normal without murmur or bruit. Regular rate and rhythm. Skin is warm and dry Pulm: Breath sounds are equal and clear bilaterally without wheezing or rhonchi. Neck: Thyroid is without mass, thyromegaly, or tenderness.   Vitals:   12/20/19 0926  BP: 110/76  Pulse: 88  Temp: 97.9 F (36.6 C)  SpO2: 98%      Assessment & Plan:   Problem List Items Addressed This Visit      Other   Depression with anxiety (Chronic)   Relevant Orders   CBC with Differential/Platelet   Comprehensive metabolic panel   VITAMIN D 25 Hydroxy (Vit-D Deficiency, Fractures)   TSH   Restless leg syndrome - Primary   Relevant Orders   CBC with Differential/Platelet   Comprehensive metabolic panel   VITAMIN D 25 Hydroxy (Vit-D Deficiency, Fractures)   TSH     Other Visit Diagnoses    Other fatigue       Relevant Orders   CBC with Differential/Platelet   Comprehensive metabolic panel   VITAMIN D 25 Hydroxy (Vit-D Deficiency, Fractures)   TSH    Continue with current regimen, would like to see if Prozac improves OCD, anxiety, and depression symptoms. Continue follow up with psychiatrist. Has tried multiple medications for RLS. Mirapex has worked the best so continue medication at this time. Will wait to see if Prozac helps.  Labs pending.   If thoughts of hurting yourself or others develop please seek immediate medical attention. Patient verbally agrees to this paln.   Return in 6 months (on 06/18/2020). Call back sooner if needed.

## 2019-12-21 ENCOUNTER — Encounter: Payer: Self-pay | Admitting: Nurse Practitioner

## 2019-12-21 NOTE — Progress Notes (Signed)
   Subjective:    Patient ID: Sheri Wood, female    DOB: January 12, 1971, 49 y.o.   MRN: [id number]  HPI    Review of Systems     Objective:   Physical Exam        Assessment & Plan:

## 2020-01-14 ENCOUNTER — Telehealth: Payer: Self-pay

## 2020-01-14 NOTE — Telephone Encounter (Signed)
Pt had blood work done and needs sent to [facility] att: Dr Dareen Piano there was blood work ordered between [facility] and [facility] so results needs to be sent to him as well.  Phone number [phone]  Pt contact [phone]

## 2020-01-15 LAB — CBC WITH DIFFERENTIAL/PLATELET
Basophils Absolute: 0.1 10*3/uL (ref 0.0–0.2)
Basos: 1 %
EOS (ABSOLUTE): 0.2 10*3/uL (ref 0.0–0.4)
Eos: 2 %
Hematocrit: 40 % (ref 34.0–46.6)
Hemoglobin: 13.7 g/dL (ref 11.1–15.9)
Immature Grans (Abs): 0 10*3/uL (ref 0.0–0.1)
Immature Granulocytes: 0 %
Lymphocytes Absolute: 1.6 10*3/uL (ref 0.7–3.1)
Lymphs: 16 %
MCH: 29.8 pg (ref 26.6–33.0)
MCHC: 34.3 g/dL (ref 31.5–35.7)
MCV: 87 fL (ref 79–97)
Monocytes Absolute: 0.6 10*3/uL (ref 0.1–0.9)
Monocytes: 6 %
Neutrophils Absolute: 7.5 10*3/uL — ABNORMAL HIGH (ref 1.4–7.0)
Neutrophils: 75 %
Platelets: 286 10*3/uL (ref 150–450)
RBC: 4.6 x10E6/uL (ref 3.77–5.28)
RDW: 12.1 % (ref 11.7–15.4)
WBC: 9.9 10*3/uL (ref 3.4–10.8)

## 2020-01-15 LAB — COMPREHENSIVE METABOLIC PANEL
ALT: 17 IU/L (ref 0–32)
AST: 17 IU/L (ref 0–40)
Albumin/Globulin Ratio: 1.7 (ref 1.2–2.2)
Albumin: 3.8 g/dL (ref 3.8–4.8)
Alkaline Phosphatase: 72 IU/L (ref 44–121)
BUN/Creatinine Ratio: 11 (ref 9–23)
BUN: 9 mg/dL (ref 6–24)
Bilirubin Total: 0.6 mg/dL (ref 0.0–1.2)
CO2: 21 mmol/L (ref 20–29)
Calcium: 8.7 mg/dL (ref 8.7–10.2)
Chloride: 103 mmol/L (ref 96–106)
Creatinine, Ser: 0.81 mg/dL (ref 0.57–1.00)
GFR calc Af Amer: 98 mL/min/{1.73_m2} (ref >59)
GFR calc non Af Amer: 85 mL/min/{1.73_m2} (ref >59)
Globulin, Total: 2.2 g/dL (ref 1.5–4.5)
Glucose: 86 mg/dL (ref 65–99)
Potassium: 4.4 mmol/L (ref 3.5–5.2)
Sodium: 139 mmol/L (ref 134–144)
Total Protein: 6 g/dL (ref 6.0–8.5)

## 2020-01-15 LAB — TSH: TSH: 0.922 u[IU]/mL (ref 0.450–4.500)

## 2020-01-15 LAB — VITAMIN D 25 HYDROXY (VIT D DEFICIENCY, FRACTURES): Vit D, 25-Hydroxy: 43.8 ng/mL (ref 30.0–100.0)

## 2020-01-17 NOTE — Telephone Encounter (Signed)
Sheri Wood specialists at [facility] are part of our Care Everywhere system and should be able to view labs. I can see Sheri Wood notes and labs from [facility] as well. Please confirm. Thanks.

## 2020-01-17 NOTE — Telephone Encounter (Signed)
Patient notified and verbalized understanding. 

## 2020-02-04 ENCOUNTER — Encounter: Payer: Self-pay | Admitting: Nurse Practitioner

## 2020-02-04 ENCOUNTER — Ambulatory Visit (INDEPENDENT_AMBULATORY_CARE_PROVIDER_SITE_OTHER): Payer: BC Managed Care – PPO | Admitting: Nurse Practitioner

## 2020-02-04 VITALS — BP 132/82 | Temp 97.2°F | Ht 67.0 in | Wt 188.4 lb

## 2020-02-04 DIAGNOSIS — G2581 Restless legs syndrome: Secondary | ICD-10-CM

## 2020-02-04 DIAGNOSIS — Z0000 Encounter for general adult medical examination without abnormal findings: Secondary | ICD-10-CM

## 2020-02-04 DIAGNOSIS — F5081 Binge eating disorder: Secondary | ICD-10-CM | POA: Diagnosis not present

## 2020-02-04 MED ORDER — PRAMIPEXOLE DIHYDROCHLORIDE 1.5 MG PO TABS: 1.5000 mg | ORAL_TABLET | Freq: Every day | ORAL | 5 refills | Status: DC

## 2020-02-04 NOTE — Patient Instructions (Addendum)
Cognitive Behavioral Therapy [facility]

## 2020-02-04 NOTE — Progress Notes (Signed)
Subjective:    Patient ID: Sheri Wood, female    DOB: [birth year], 49 y.o.   MRN: [id number]  HPI  The patient comes in today for a wellness visit.    A review of their health history was completed.  A review of medications was also completed.  Any needed refills; needs refill mirapex  Eating habits: eating good  Falls/  MVA accidents in past few months: none  Regular exercise: cardio and resistance training  Specialist pt sees on regular basis: rheumatologist  And mental health provider  Preventative health issues were discussed.   Additional concerns: continued joint pain Still struggles with overeating even with Vyvanse.  States she does have some obsessive-compulsive tendencies.  Non-smoker.  No alcohol drug or marijuana use. Mirapex working well for restless leg syndrome, request refills. Review of Systems  Constitutional: Positive for fatigue. Negative for activity change and appetite change.  Respiratory: Negative for cough, chest tightness, shortness of breath and wheezing.   Cardiovascular: Positive for chest pain.       Localized intermittent chest discomfort mainly lower sternum between the breasts. Had this several years ago. Came back over the past year. Unassociated with activity. Rides her Peloton for exercise with no CP or SOB. Had EKG with previous episodes which was normal.   Gastrointestinal: Positive for constipation. Negative for abdominal distention, abdominal pain, blood in stool, diarrhea, nausea and vomiting.  Genitourinary: Positive for enuresis. Negative for difficulty urinating, dysuria, frequency and urgency.       Some stress incontinence. Does not wear daily pad. Gets female physicals with GYN.   Musculoskeletal: Positive for arthralgias, joint swelling and myalgias.       Has been seeing rheumatologist in [place]. Referred to specialist at [facility] for second opinion.    Flowsheet Row Office Visit from 02/04/2020 in [facility]  PHQ-9 Total Score 7          Objective:   Physical Exam Constitutional:      General: She is not in acute distress.    Appearance: She is well-developed.  HENT:     Mouth/Throat:     Mouth: Oropharynx is clear and moist.  Neck:     Thyroid: No thyromegaly.     Trachea: No tracheal deviation.     Comments: Thyroid non tender to palpation. No mass or goiter.  Cardiovascular:     Rate and Rhythm: Normal rate and regular rhythm.     Heart sounds: Normal heart sounds. No murmur heard. No gallop.   Pulmonary:     Effort: Pulmonary effort is normal.     Breath sounds: Normal breath sounds.  Abdominal:     General: There is no distension.     Palpations: Abdomen is soft. There is no mass.     Tenderness: There is no abdominal tenderness.  Genitourinary:    Vagina: Normal.     Uterus: Normal.   Musculoskeletal:        General: No edema.     Cervical back: Normal range of motion and neck supple.  Lymphadenopathy:     Cervical: No cervical adenopathy.  Skin:    General: Skin is warm and dry.  Neurological:     Mental Status: She is alert and oriented to person, place, and time.  Psychiatric:        Mood and Affect: Mood and affect and mood normal.        Behavior: Behavior normal.  Thought Content: Thought content normal.        Judgment: Judgment normal.    Today's Vitals   02/04/20 1000  BP: 132/82  Temp: (!) 97.2 F (36.2 C)  TempSrc: Oral  Weight: 188 lb 6.4 oz (85.5 kg)  Height: 5\' 7"  (1.702 m)   Body mass index is 29.51 kg/m. See labs dated 01/14/2020.       Assessment & Plan:   Problem List Items Addressed This Visit      Other   Binge eating disorder   Restless leg syndrome   Relevant Medications   pramipexole (MIRAPEX) 1.5 MG tablet    Other Visit Diagnoses    Routine general medical examination at a health care facility    -  Primary      Meds ordered this encounter  Medications  . pramipexole (MIRAPEX) 1.5 MG tablet     Sig: Take 1 tablet (1.5 mg total) by mouth at bedtime.    Dispense:  30 tablet    Refill:  5    Order Specific Question:   Supervising Provider    Answer:   14/08/2019 A [[id number]]   Continue follow up with her specialist. Continue regular activity and weight loss efforts. Consider cognitive behavioral therapy to help her with binge eating disorder.  Given information on online programs that may be more affordable. Continue follow-up with gynecology for her pelvic exams and mammograms. Strongly recommend she consider a colonoscopy this year. Return in about 1 year (around 02/03/2021) for physical. 30 minutes was spent with the patient including previsit chart review, time spent with patient, discussion of health issues, review of data including medical record, and documentation of the visit.

## 2020-02-06 ENCOUNTER — Telehealth: Payer: Self-pay

## 2020-02-06 NOTE — Telephone Encounter (Signed)
Paperwork dropped off for [employer]   Pt call back [phone]

## 2020-02-06 NOTE — Telephone Encounter (Signed)
Lab results put on carolyns desk

## 2020-02-14 ENCOUNTER — Encounter: Payer: Self-pay | Admitting: Nurse Practitioner

## 2020-03-03 ENCOUNTER — Telehealth (HOSPITAL_COMMUNITY): Payer: Self-pay | Admitting: Psychiatry

## 2020-03-03 NOTE — Telephone Encounter (Signed)
Called to schedule f/u appt left vm 

## 2020-03-12 ENCOUNTER — Ambulatory Visit (INDEPENDENT_AMBULATORY_CARE_PROVIDER_SITE_OTHER): Payer: BC Managed Care – PPO | Admitting: Family Medicine

## 2020-03-12 DIAGNOSIS — R300 Dysuria: Secondary | ICD-10-CM | POA: Diagnosis not present

## 2020-03-12 DIAGNOSIS — L739 Follicular disorder, unspecified: Secondary | ICD-10-CM

## 2020-03-12 DIAGNOSIS — R519 Headache, unspecified: Secondary | ICD-10-CM | POA: Diagnosis not present

## 2020-03-12 DIAGNOSIS — R110 Nausea: Secondary | ICD-10-CM | POA: Diagnosis not present

## 2020-03-12 LAB — POCT URINALYSIS DIPSTICK
Spec Grav, UA: <=1.005 — AB (ref 1.010–1.025)
pH, UA: 6 (ref 5.0–8.0)

## 2020-03-12 MED ORDER — DOXYCYCLINE HYCLATE 100 MG PO TABS: 100.0000 mg | ORAL_TABLET | Freq: Two times a day (BID) | ORAL | 0 refills | Status: DC

## 2020-03-12 NOTE — Progress Notes (Signed)
   Subjective:    Patient ID: Sheri Wood, female    DOB: 06-13-1970, 50 y.o.   MRN: [id number]  HPI  Patient presents today with respiratory illness Number of days present- 3 days nausea and fatigue   Symptoms include- nausea, fatigue, rash for 2 weeks under breast area and top of torso. Painful and itching.  She relates that this is been going on for about 2 weeks multiple red bumps that are very tender and not draining any pus  Presence of worrisome signs (severe shortness of breath, lethargy, etc.) - none  Recent/current visit to urgent care or ER-none  Recent direct exposure to Covid-husband had COVID last month  Any current Covid testing- home test Tuesday-negative  Results for orders placed or performed in visit on 03/12/20  POCT urinalysis dipstick  Result Value Ref Range   Color, UA     Clarity, UA     Glucose, UA     Bilirubin, UA     Ketones, UA     Spec Grav, UA <=1.005 (A) 1.010 - 1.025   Blood, UA     pH, UA 6.0 5.0 - 8.0   Protein, UA     Urobilinogen, UA     Nitrite, UA     Leukocytes, UA Moderate (2+) (A) Negative   Appearance     Odor    She states that at times her urine slight odor to it but denies any high fever chills or sweats over the past few weeks  She is very frustrated by her rheumatologic issues.  She has been having a lot of pain and discomfort has seen [facility] several times so far they have not been able to come to any particular conclusion  Review of Systems Please see above    Objective:   Physical Exam Lungs clear respiratory rate normal heart regular no murmurs pulse normal throat normal neck supple eardrums normal  Abdomen is soft folliculitis on the upper abdomen nurse present during exam     Assessment & Plan:  Folliculitis Antibiotic for 7 days Persistent nausea possible COVID test taken Urine culture sent await results Ongoing arthralgias recommend that she follow back up with [facility] and if still not getting anywhere then  follow-up with [place] for further possible referrals

## 2020-03-13 LAB — SPECIMEN STATUS REPORT

## 2020-03-13 LAB — SARS-COV-2, NAA 2 DAY TAT

## 2020-03-13 LAB — NOVEL CORONAVIRUS, NAA: SARS-CoV-2, NAA: NOT DETECTED

## 2020-03-14 LAB — URINE CULTURE

## 2020-03-14 LAB — SPECIMEN STATUS REPORT

## 2020-03-15 ENCOUNTER — Encounter: Payer: Self-pay | Admitting: Family Medicine

## 2020-03-16 ENCOUNTER — Other Ambulatory Visit: Payer: Self-pay | Admitting: *Deleted

## 2020-03-16 DIAGNOSIS — R109 Unspecified abdominal pain: Secondary | ICD-10-CM

## 2020-03-16 DIAGNOSIS — R110 Nausea: Secondary | ICD-10-CM

## 2020-03-16 NOTE — Telephone Encounter (Signed)
Nurses It would be reasonable to do ultrasound of abdomen attention to right upper quadrant due to postprandial nausea and discomfort  Please set up ultrasound Also forward note to Trilby Drummer  It would be reasonable to do an ultrasound.  Ultrasounds can detect 90% of the gallbladder issues.  But if you continue to have ongoing troubles and the ultrasound is negative it is possible you may need to have a special test called a HIDA test which is a type of a nuclear medicine scan and in some situations may also need to have consultation with gastroenterology  Starting off with the ultrasound versus a reasonable step Please keep [place] posted Thanks-Dr. Lorin Picket

## 2020-03-17 ENCOUNTER — Encounter: Payer: Self-pay | Admitting: Family Medicine

## 2020-03-17 ENCOUNTER — Telehealth: Payer: Self-pay | Admitting: Family Medicine

## 2020-03-17 NOTE — Telephone Encounter (Signed)
Nurses  It is my understanding that Monday is the first available opening for this scheduled test. Unfortunately there is only so many they can do per day.  Certainly I am sympathetic to what is going on with the patient. Please see if radiology would be willing to put [facility] on a waiting list so if a cancellation occurs she could be called.  Also if she would like to have some prescription for Phenergan to utilize if Zofran is not helping this could be done.  Obviously if fever severe vomiting or severe worsening of condition it is important we are brought up-to-date with those symptoms  Thanks-Dr. Lorin Picket

## 2020-03-18 ENCOUNTER — Other Ambulatory Visit: Payer: Self-pay | Admitting: Family Medicine

## 2020-03-18 MED ORDER — PROMETHAZINE HCL 25 MG PO TABS: 25.0000 mg | ORAL_TABLET | Freq: Three times a day (TID) | ORAL | 0 refills | Status: DC | PRN

## 2020-03-18 NOTE — Telephone Encounter (Signed)
Patient would like to have a script for Phenergan sent to [facility]. Offered to check with other hospitals to see if they could see her sooner and patient stated she would stick with current appointment and call back if worse.

## 2020-03-20 ENCOUNTER — Telehealth: Payer: BC Managed Care – PPO | Admitting: Nurse Practitioner

## 2020-03-23 ENCOUNTER — Other Ambulatory Visit: Payer: Self-pay

## 2020-03-23 ENCOUNTER — Ambulatory Visit (HOSPITAL_COMMUNITY)
Admission: RE | Admit: 2020-03-23 | Discharge: 2020-03-23 | Disposition: A | Discharge status: Home / Self Care | Payer: BC Managed Care – PPO | Source: Ambulatory Visit | Attending: Family Medicine | Admitting: Family Medicine

## 2020-03-23 ENCOUNTER — Encounter: Payer: Self-pay | Admitting: Family Medicine

## 2020-03-23 DIAGNOSIS — R110 Nausea: Secondary | ICD-10-CM | POA: Insufficient documentation

## 2020-03-23 DIAGNOSIS — R109 Unspecified abdominal pain: Secondary | ICD-10-CM | POA: Insufficient documentation

## 2020-03-23 NOTE — Telephone Encounter (Signed)
Nurses May send in Diflucan 150 mg, #1, take 1 now, may have 2 refills Patient may also have a prescription for Terazol 7 cream although the Diflucan should take care of the issue

## 2020-03-24 ENCOUNTER — Other Ambulatory Visit: Payer: Self-pay | Admitting: Family Medicine

## 2020-03-24 ENCOUNTER — Other Ambulatory Visit: Payer: Self-pay | Admitting: *Deleted

## 2020-03-24 DIAGNOSIS — K8020 Calculus of gallbladder without cholecystitis without obstruction: Secondary | ICD-10-CM

## 2020-03-24 MED ORDER — TERCONAZOLE 0.4 % VA CREA: 1.0000 | TOPICAL_CREAM | Freq: Every day | VAGINAL | 0 refills | Status: DC

## 2020-03-24 MED ORDER — FLUCONAZOLE 150 MG PO TABS: 150.0000 mg | ORAL_TABLET | Freq: Once | ORAL | 0 refills | Status: AC

## 2020-03-25 ENCOUNTER — Encounter: Payer: Self-pay | Admitting: Family Medicine

## 2020-03-26 NOTE — Telephone Encounter (Signed)
Nurses Please forward my response to Sheri Wood I reviewed over her CRP readings.  I also took into account what she stated regarding the gallbladder.  Wood most individuals who have gallbladder related issues do not have elevated CRPs.  Now certainly in the middle of gallbladder attacks inflammation can go up.  But in general for situation I believe that her symptoms that we have been working out recently of the abdominal would not have caused elevated CRPs in other situations.  I believe that that is more related to other factors.  As for how long the gallstones have been there many individuals can have gallstones and have asymptomatic condition meaning that may have gallstones but it is not causing him any pain.  This can go on for years.  Not when it starts causing upper abdominal discomfort with nausea and/or vomiting then it is reasonable to see a surgeon and have this issue taken care of. Thanks-Dr. Lorin Picket   .

## 2020-03-31 ENCOUNTER — Ambulatory Visit: Payer: BC Managed Care – PPO | Admitting: General Surgery

## 2020-03-31 ENCOUNTER — Other Ambulatory Visit: Payer: Self-pay

## 2020-03-31 ENCOUNTER — Encounter: Payer: Self-pay | Admitting: Family Medicine

## 2020-03-31 ENCOUNTER — Encounter: Payer: Self-pay | Admitting: General Surgery

## 2020-03-31 VITALS — BP 99/68 | HR 77 | Temp 97.8°F | Resp 12 | Ht 67.0 in | Wt 198.0 lb

## 2020-03-31 DIAGNOSIS — K8020 Calculus of gallbladder without cholecystitis without obstruction: Secondary | ICD-10-CM

## 2020-03-31 NOTE — Patient Instructions (Signed)
Cholelithiasis  Cholelithiasis happens when gallstones form in the gallbladder. The gallbladder stores bile. Bile is a fluid that helps digest fats. Bile can harden and form into gallstones. If they cause a blockage, they can cause pain (gallbladder attack). What are the causes? This condition may be caused by:  Some blood diseases, such as sickle cell anemia.  Too much of a fat-like substance (cholesterol) in your bile.  Not enough bile salts in your bile. These salts help the body absorb and digest fats.  The gallbladder not emptying fully or often enough. This is common in pregnant women. What increases the risk? The following factors may make you more likely to develop this condition:  Being female.  Being pregnant many times.  Eating a lot of fried foods, fat, and refined carbs (refined carbohydrates).  Being very overweight (obese).  Being older than age 40.  Using medicines with female hormones in them for a long time.  Losing weight fast.  Having gallstones in your family.  Having some health problems, such as diabetes, Crohn's disease, or liver disease. What are the signs or symptoms? Often, there may be gallstones but no symptoms. These gallstones are called silent gallstones. If a gallstone causes a blockage, you may get sudden pain. The pain:  Can be in the upper right part of your belly (abdomen).  Normally comes at night or after you eat.  Can last an hour or more.  Can spread to your right shoulder, back, or chest.  Can feel like discomfort, burning, or fullness in the upper part of your belly (indigestion). If the blockage lasts more than a few hours, you can get an infection or swelling. You may:  Feel like you may vomit.  Vomit.  Feel bloated.  Have belly pain for 5 hours or more.  Feel tender in your belly, often in the upper right part and under your ribs.  Have fever or chills.  Have skin or the white parts of your eyes turn yellow  (jaundice).  Have dark pee (urine) or pale poop (stool). How is this treated? Treatment for this condition depends on how bad you feel. If you have symptoms, you may need:  Home care, if symptoms are not very bad. ? Do not eat for 12-24 hours. Drink only water and clear liquids. ? Start to eat simple or clear foods after 1 or 2 days. Try broths and crackers. ? You may need medicines for pain or stomach upset or both. ? If you have an infection, you will need antibiotics.  A hospital stay, if you have very bad pain or a very bad infection.  Surgery to remove your gallbladder. You may need this if: ? Gallstones keep coming back. ? You have very bad symptoms.  Medicines to break up gallstones. Medicines: ? Are best for small gallstones. ? May be used for up to 6-12 months.  A procedure to find and take out gallstones or to break up gallstones. Follow these instructions at home: Medicines  Take over-the-counter and prescription medicines only as told by your doctor.  If you were prescribed an antibiotic medicine, take it as told by your doctor. Do not stop taking the antibiotic even if you start to feel better.  Ask your doctor if the medicine prescribed to you requires you to avoid driving or using machinery. Eating and drinking  Drink enough fluid to keep your urine pale yellow. Drink water or clear fluids. This is important when you have pain.  Eat   healthy foods. Choose: ? Fewer fatty foods, such as fried foods. ? Fewer refined carbs. Avoid breads and grains that are highly processed, such as white bread and white rice. Choose whole grains, such as whole-wheat bread and brown rice. ? More fiber. Almonds, fresh fruit, and beans are healthy sources. General instructions  Keep a healthy weight.  Keep all follow-up visits as told by your doctor. This is important. Where to find more information  National Institute of Diabetes and Digestive and Kidney Diseases:  www.niddk.nih.gov Contact a doctor if:  You have sudden pain in the upper right part of your belly. Pain might spread to your right shoulder, back, or chest.  You have been diagnosed with gallstones that have no symptoms and you get: ? Belly pain. ? Discomfort, burning, or fullness in the upper part of your abdomen.  You have dark urine or pale stools. Get help right away if:  You have sudden pain in the upper right part of your abdomen, and the pain lasts more than 2 hours.  You have pain in your abdomen, and: ? It lasts more than 5 hours. ? It keeps getting worse.  You have a fever or chills.  You keep feeling like you may vomit.  You keep vomiting.  Your skin or the white parts of your eyes turn yellow. Summary  Cholelithiasis happens when gallstones form in the gallbladder.  This condition may be caused by a blood disease, too much of a fat-like substance in the bile, or not enough bile salts in bile.  Treatment for this condition depends on how bad you feel.  If you have symptoms, do not eat or drink. You may need medicines. You may need a hospital stay for very bad pain or a very bad infection.  You may need surgery if gallstones keep coming back or if you have very bad symptoms. This information is not intended to replace advice given to you by your health care provider. Make sure you discuss any questions you have with your health care provider. Document Revised: 03/15/2019 Document Reviewed: 12/17/2018 Elsevier Patient Education  2021 Elsevier Inc.   Minimally Invasive Cholecystectomy Minimally invasive cholecystectomy is surgery to remove the gallbladder. The gallbladder is a pear-shaped organ that lies beneath the liver on the right side of the body. The gallbladder stores bile, which is a fluid that helps the body digest fats. Cholecystectomy is often done to treat inflammation of the gallbladder (cholecystitis). This condition is usually caused by a buildup of  gallstones (cholelithiasis) in the gallbladder. Gallstones can block the flow of bile, which can result in inflammation and pain. In severe cases, emergency surgery may be required. This procedure is done though small incisions in the abdomen, instead of one large incision. It is also called laparoscopic surgery. A thin scope with a camera (laparoscope) is inserted through one incision. Then surgical instruments are inserted through the other incisions. In some cases, a minimally invasive surgery may need to be changed to a surgery that is done through a larger incision. This is called open surgery. Tell a health care provider about:  Any allergies you have.  All medicines you are taking, including vitamins, herbs, eye drops, creams, and over-the-counter medicines.  Any problems you or family members have had with anesthetic medicines.  Any blood disorders you have.  Any surgeries you have had.  Any medical conditions you have.  Whether you are pregnant or may be pregnant. What are the risks? Generally, this is a   safe procedure. However, problems may occur, including:  Infection.  Bleeding.  Allergic reactions to medicines.  Damage to nearby structures or organs.  A stone remaining in the common bile duct. The common bile duct carries bile from the gallbladder into the small intestine.  A bile leak from the cyst duct that is clipped when your gallbladder is removed. Medicines Ask your health care provider about:  Changing or stopping your regular medicines. This is especially important if you are taking diabetes medicines or blood thinners.  Taking medicines such as aspirin and ibuprofen. These medicines can thin your blood. Do not take these medicines unless your health care provider tells you to take them.  Taking over-the-counter medicines, vitamins, herbs, and supplements. General instructions  Let your health care provider know if you develop a cold or an infection  before surgery.  Plan to have someone take you home from the hospital or clinic.  If you will be going home right after the procedure, plan to have someone with you for 24 hours.  Ask your health care provider: ? How your surgery site will be marked. ? What steps will be taken to help prevent infection. These may include:  Removing hair at the surgery site.  Washing skin with a germ-killing soap.  Taking antibiotic medicine. What happens during the procedure?  An IV will be inserted into one of your veins.  You will be given one or both of the following: ? A medicine to help you relax (sedative). ? A medicine to make you fall asleep (general anesthetic).  A breathing tube will be placed in your mouth.  Your surgeon will make several small incisions in your abdomen.  The laparoscope will be inserted through one of the small incisions. The camera on the laparoscope will send images to a monitor in the operating room. This lets your surgeon see inside your abdomen.  A gas will be pumped into your abdomen. This will expand your abdomen to give the surgeon more room to perform the surgery.  Other tools that are needed for the procedure will be inserted through the other incisions. The gallbladder will be removed through one of the incisions.  Your common bile duct may be examined. If stones are found in the common bile duct, they may be removed.  After your gallbladder has been removed, the incisions will be closed with stitches (sutures), staples, or skin glue.  Your incisions may be covered with a bandage (dressing). The procedure may vary among health care providers and hospitals.   What happens after the procedure?  Your blood pressure, heart rate, breathing rate, and blood oxygen level will be monitored until you leave the hospital or clinic.  You will be given medicines as needed to control your pain.  If you were given a sedative during the procedure, it can affect you  for several hours. Do not drive or operate machinery until your health care provider says that it is safe. Summary  Minimally invasive cholecystectomy, also called laparoscopic cholecystectomy, is surgery to remove the gallbladder using small incisions.  Tell your health care provider about all the medical conditions you have and all the medicines you are taking for those conditions.  Before the procedure, follow instructions about eating or drinking restrictions and changing or stopping medicines.  If you were given a sedative during the procedure, it can affect you for several hours. Do not drive or operate machinery until your health care provider says that it is safe. This   information is not intended to replace advice given to you by your health care provider. Make sure you discuss any questions you have with your health care provider. Document Revised: 10/29/2018 Document Reviewed: 10/29/2018 Elsevier Patient Education  2021 Elsevier Inc.   

## 2020-03-31 NOTE — Progress Notes (Signed)
[facility] History and Physical  Reason for Referral: Gallstones  Referring Physician:  Annalee Genta, DO  Chief Complaint    New Patient (Initial Visit)      Sheri Wood is a 50 y.o. female.  HPI:  Sheri Wood is a very sweet 50 yo who comes in with complaints of epigastric pain going to the right side and into her back. She was seen by her PCP when it first started and felt like she must have a GI bug because she was also having some changes in her BMs.  She says that her pain has consistently been more in the upper abdomen and radiating to the right and she is more nauseated. She has not gotten any better and has only gotten worse. She has had some issues with gastritis in the past and takes diclofenac for some joint pain but this is not new.    She says that she cannot eat much at this point and has been limiting her diet.   Past Medical History:  Diagnosis Date  . Allergy    seasonal  . Anxiety   . Depression   . Fibromyalgia   . Gastritis    history  . GERD (gastroesophageal reflux disease)    otc med prn  . IBS (irritable bowel syndrome)   . Migraine   . SVD (spontaneous vaginal delivery)    x 2  . Urticaria, chronic   . Vertigo     Past Surgical History:  Procedure Laterality Date  . COLONOSCOPY  2008   negative  . DIAGNOSTIC LAPAROSCOPY     fibroids  . DILATION AND CURETTAGE OF UTERUS    . DILITATION & CURRETTAGE/HYSTROSCOPY WITH NOVASURE ABLATION N/A 05/06/2013   Procedure: DILATATION & CURETTAGE/HYSTEROSCOPY WITH NOVASURE ABLATION;  Surgeon: Alphonsus Sias. Ernestina Penna, MD;  Location: [facility];  Service: Gynecology;  Laterality: N/A;  . EYE SURGERY     bilateral lasik  . FOOT SURGERY  2017  . LAPAROSCOPIC BILATERAL SALPINGECTOMY Bilateral 05/06/2013   Procedure: LAPAROSCOPIC BILATERAL SALPINGECTOMY;  Surgeon: Tresa Endo A. Ernestina Penna, MD;  Location: [facility];  Service: Gynecology;  Laterality: Bilateral;  . WISDOM TOOTH EXTRACTION      Family History   Problem Relation Age of Onset  . Alcohol abuse Brother   . Drug abuse Brother   . Depression Mother   . Anxiety disorder Mother   . Anxiety disorder Brother   . Depression Brother   . Bipolar disorder Maternal Uncle   . Anxiety disorder Paternal Grandfather   . Depression Paternal Grandfather   . Bipolar disorder Cousin     Social History   Tobacco Use  . Smoking status: Former Smoker    Packs/day: 0.75    Years: 10.00    Pack years: 7.50    Types: Cigarettes    Quit date: 02/08/1988    Years since quitting: 32.1  . Smokeless tobacco: Never Used  Substance Use Topics  . Alcohol use: No  . Drug use: No    Medications: I have reviewed the patient's current medications. Allergies as of 03/31/2020      Reactions   Ciprofloxacin Other (See Comments)   Severe muscle and joint pain   Cefzil [cefprozil]    possible hives   Augmentin [amoxicillin-pot Clavulanate] Nausea And Vomiting   Throat felt funny and gave pt anxiety   Bee Venom Swelling      Medication List       Accurate as of March 31, 2020  10:30 AM. If you have any questions, ask your nurse or doctor.        STOP taking these medications   cyclobenzaprine 10 MG tablet Commonly known as: FLEXERIL Stopped by: [name], MD   doxycycline 100 MG tablet Commonly known as: VIBRA-TABS Stopped by: [name], MD   hydroxychloroquine 200 MG tablet Commonly known as: PLAQUENIL Stopped by: [name], MD   NON FORMULARY Stopped by: [name], MD     TAKE these medications   buPROPion 150 MG 12 hr tablet Commonly known as: Wellbutrin SR Take 1 tablet (150 mg total) by mouth 2 (two) times daily.   diazepam 5 MG tablet Commonly known as: VALIUM Take 1 tablet (5 mg total) by mouth every 6 (six) hours as needed.   diclofenac 75 MG EC tablet Commonly known as: VOLTAREN Take 1 tablet (75 mg total) by mouth 2 (two) times daily.   eletriptan 40 MG tablet Commonly known as:  RELPAX May repeat in 2 hours if headache persists or recurs.   FLUoxetine 20 MG capsule Commonly known as: PROZAC Take 1 capsule (20 mg total) by mouth daily.   lisdexamfetamine 40 MG capsule Commonly known as: Vyvanse Take 1 capsule (40 mg total) by mouth every morning.   meclizine 25 MG tablet Commonly known as: ANTIVERT Take 1 tablet (25 mg total) by mouth 2 (two) times daily as needed for dizziness.   ondansetron 8 MG tablet Commonly known as: Zofran Take 1 tablet (8 mg total) by mouth every 8 (eight) hours as needed for nausea.   pramipexole 1.5 MG tablet Commonly known as: MIRAPEX Take 1 tablet (1.5 mg total) by mouth at bedtime.   promethazine 25 MG tablet Commonly known as: PHENERGAN Take 1 tablet (25 mg total) by mouth every 8 (eight) hours as needed for nausea or vomiting.   terconazole 0.4 % vaginal cream Commonly known as: TERAZOL 7 Place 1 applicator vaginally at bedtime.        ROS:  A comprehensive review of systems was negative except for: Constitutional: positive for chills Gastrointestinal: positive for abdominal pain and nausea Genitourinary: positive for frequency Musculoskeletal: positive for bone pain, neck pain and joint pain  Blood pressure 99/68, pulse 77, temperature 97.8 F (36.6 C), temperature source Other (Comment), resp. rate 12, height 5\' 7"  (1.702 m), weight 198 lb (89.8 kg), SpO2 95 %. Physical Exam Vitals reviewed.  Constitutional:      Appearance: Normal appearance.  HENT:     Head: Normocephalic.     Mouth/Throat:     Mouth: Mucous membranes are moist.  Eyes:     Extraocular Movements: Extraocular movements intact.  Cardiovascular:     Rate and Rhythm: Normal rate and regular rhythm.  Pulmonary:     Effort: Pulmonary effort is normal.     Breath sounds: Normal breath sounds.  Abdominal:     General: There is no distension.     Palpations: Abdomen is soft.     Tenderness: There is abdominal tenderness in the right upper  quadrant and epigastric area.  Musculoskeletal:        General: No swelling. Normal range of motion.     Cervical back: Normal range of motion.  Skin:    General: Skin is warm.  Neurological:     General: No focal deficit present.     Mental Status: She is alert and oriented to person, place, and time.  Psychiatric:  Mood and Affect: Mood normal.        Behavior: Behavior normal.        Thought Content: Thought content normal.        Judgment: Judgment normal.     Results: CLINICAL DATA:  Nausea and abdominal discomfort x2 weeks  EXAM: ULTRASOUND ABDOMEN LIMITED RIGHT UPPER QUADRANT  COMPARISON:  CT abdomen pelvis September 18, 2015  FINDINGS: Gallbladder:  Sludge and cholelithiasis measuring up to 6 mm. No pericholecystic fluid or wall thickening visualized. No sonographic Murphy sign noted by sonographer.  Common bile duct:  Diameter: 2 mm  Liver:  No focal lesion identified. Diffuse mild increase in parenchymal echogenicity. Portal vein is patent on color Doppler imaging with normal direction of blood flow towards the liver.  Other: None.  IMPRESSION: 1. Sludge and cholelithiasis without sonographic evidence of acute cholecystitis. 2. The echogenicity of the liver is mildly increased. This is a nonspecific finding but is most commonly seen with fatty infiltration of the liver. There are no obvious focal liver lesions.   Electronically Signed   By: Maudry Mayhew MD   On: 03/23/2020 09:32  Assessment & Plan:  Sheri Wood is a 50 y.o. female with gallstones who is having continued pain and nausea. We discussed that she does have risk for gastritis but that most of her symptoms point to gallbladder.  She would like to proceed with removal.   -PLAN: I counseled the patient about the indication, risks and benefits of laparoscopic cholecystectomy.  She understands there is a very small chance for bleeding, infection, injury to normal  structures (including common bile duct), conversion to open surgery, persistent symptoms, evolution of postcholecystectomy diarrhea, need for secondary interventions, anesthesia reaction, cardiopulmonary issues and other risks not specifically detailed here. I described the expected recovery, the plan for follow-up and the restrictions during the recovery phase.  All questions were answered.  Discussed preop COVID testing.  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      [name] 03/31/2020, 10:30 AM

## 2020-04-01 NOTE — H&P (Signed)
[facility] History and Physical  Reason for Referral: Gallstones  Referring Physician:  Taylor, Malena M, DO  Chief Complaint    New Patient (Initial Visit)      Bindu L Pfenning is a 50 y.o. female.  HPI:  Ms. Jurewicz is a very sweet 50 yo who comes in with complaints of epigastric pain going to the right side and into her back. She was seen by her PCP when it first started and felt like she must have a GI bug because she was also having some changes in her BMs.  She says that her pain has consistently been more in the upper abdomen and radiating to the right and she is more nauseated. She has not gotten any better and has only gotten worse. She has had some issues with gastritis in the past and takes diclofenac for some joint pain but this is not new.    She says that she cannot eat much at this point and has been limiting her diet.   Past Medical History:  Diagnosis Date  . Allergy    seasonal  . Anxiety   . Depression   . Fibromyalgia   . Gastritis    history  . GERD (gastroesophageal reflux disease)    otc med prn  . IBS (irritable bowel syndrome)   . Migraine   . SVD (spontaneous vaginal delivery)    x 2  . Urticaria, chronic   . Vertigo     Past Surgical History:  Procedure Laterality Date  . COLONOSCOPY  2008   negative  . DIAGNOSTIC LAPAROSCOPY     fibroids  . DILATION AND CURETTAGE OF UTERUS    . DILITATION & CURRETTAGE/HYSTROSCOPY WITH NOVASURE ABLATION N/A 05/06/2013   Procedure: DILATATION & CURETTAGE/HYSTEROSCOPY WITH NOVASURE ABLATION;  Surgeon: Kelly A. Fogleman, MD;  Location: [facility];  Service: Gynecology;  Laterality: N/A;  . EYE SURGERY     bilateral lasik  . FOOT SURGERY  2017  . LAPAROSCOPIC BILATERAL SALPINGECTOMY Bilateral 05/06/2013   Procedure: LAPAROSCOPIC BILATERAL SALPINGECTOMY;  Surgeon: Kelly A. Fogleman, MD;  Location: [facility];  Service: Gynecology;  Laterality: Bilateral;  . WISDOM TOOTH EXTRACTION      Family History   Problem Relation Age of Onset  . Alcohol abuse Brother   . Drug abuse Brother   . Depression Mother   . Anxiety disorder Mother   . Anxiety disorder Brother   . Depression Brother   . Bipolar disorder Maternal Uncle   . Anxiety disorder Paternal Grandfather   . Depression Paternal Grandfather   . Bipolar disorder Cousin     Social History   Tobacco Use  . Smoking status: Former Smoker    Packs/day: 0.75    Years: 10.00    Pack years: 7.50    Types: Cigarettes    Quit date: 02/08/1988    Years since quitting: 32.1  . Smokeless tobacco: Never Used  Substance Use Topics  . Alcohol use: No  . Drug use: No    Medications: I have reviewed the patient's current medications. Allergies as of 03/31/2020      Reactions   Ciprofloxacin Other (See Comments)   Severe muscle and joint pain   Cefzil [cefprozil]    possible hives   Augmentin [amoxicillin-pot Clavulanate] Nausea And Vomiting   Throat felt funny and gave pt anxiety   Bee Venom Swelling      Medication List       Accurate as of March 31, 2020   10:30 AM. If you have any questions, ask your nurse or doctor.        STOP taking these medications   cyclobenzaprine 10 MG tablet Commonly known as: FLEXERIL Stopped by: [name], MD   doxycycline 100 MG tablet Commonly known as: VIBRA-TABS Stopped by: [name], MD   hydroxychloroquine 200 MG tablet Commonly known as: PLAQUENIL Stopped by: [name], MD   NON FORMULARY Stopped by: [name], MD     TAKE these medications   buPROPion 150 MG 12 hr tablet Commonly known as: Wellbutrin SR Take 1 tablet (150 mg total) by mouth 2 (two) times daily.   diazepam 5 MG tablet Commonly known as: VALIUM Take 1 tablet (5 mg total) by mouth every 6 (six) hours as needed.   diclofenac 75 MG EC tablet Commonly known as: VOLTAREN Take 1 tablet (75 mg total) by mouth 2 (two) times daily.   eletriptan 40 MG tablet Commonly known as:  RELPAX May repeat in 2 hours if headache persists or recurs.   FLUoxetine 20 MG capsule Commonly known as: PROZAC Take 1 capsule (20 mg total) by mouth daily.   lisdexamfetamine 40 MG capsule Commonly known as: Vyvanse Take 1 capsule (40 mg total) by mouth every morning.   meclizine 25 MG tablet Commonly known as: ANTIVERT Take 1 tablet (25 mg total) by mouth 2 (two) times daily as needed for dizziness.   ondansetron 8 MG tablet Commonly known as: Zofran Take 1 tablet (8 mg total) by mouth every 8 (eight) hours as needed for nausea.   pramipexole 1.5 MG tablet Commonly known as: MIRAPEX Take 1 tablet (1.5 mg total) by mouth at bedtime.   promethazine 25 MG tablet Commonly known as: PHENERGAN Take 1 tablet (25 mg total) by mouth every 8 (eight) hours as needed for nausea or vomiting.   terconazole 0.4 % vaginal cream Commonly known as: TERAZOL 7 Place 1 applicator vaginally at bedtime.        ROS:  A comprehensive review of systems was negative except for: Constitutional: positive for chills Gastrointestinal: positive for abdominal pain and nausea Genitourinary: positive for frequency Musculoskeletal: positive for bone pain, neck pain and joint pain  Blood pressure 99/68, pulse 77, temperature 97.8 F (36.6 C), temperature source Other (Comment), resp. rate 12, height 5\' 7"  (1.702 m), weight 198 lb (89.8 kg), SpO2 95 %. Physical Exam Vitals reviewed.  Constitutional:      Appearance: Normal appearance.  HENT:     Head: Normocephalic.     Mouth/Throat:     Mouth: Mucous membranes are moist.  Eyes:     Extraocular Movements: Extraocular movements intact.  Cardiovascular:     Rate and Rhythm: Normal rate and regular rhythm.  Pulmonary:     Effort: Pulmonary effort is normal.     Breath sounds: Normal breath sounds.  Abdominal:     General: There is no distension.     Palpations: Abdomen is soft.     Tenderness: There is abdominal tenderness in the right upper  quadrant and epigastric area.  Musculoskeletal:        General: No swelling. Normal range of motion.     Cervical back: Normal range of motion.  Skin:    General: Skin is warm.  Neurological:     General: No focal deficit present.     Mental Status: She is alert and oriented to person, place, and time.  Psychiatric:  Mood and Affect: Mood normal.        Behavior: Behavior normal.        Thought Content: Thought content normal.        Judgment: Judgment normal.     Results: CLINICAL DATA:  Nausea and abdominal discomfort x2 weeks  EXAM: ULTRASOUND ABDOMEN LIMITED RIGHT UPPER QUADRANT  COMPARISON:  CT abdomen pelvis September 18, 2015  FINDINGS: Gallbladder:  Sludge and cholelithiasis measuring up to 6 mm. No pericholecystic fluid or wall thickening visualized. No sonographic Murphy sign noted by sonographer.  Common bile duct:  Diameter: 2 mm  Liver:  No focal lesion identified. Diffuse mild increase in parenchymal echogenicity. Portal vein is patent on color Doppler imaging with normal direction of blood flow towards the liver.  Other: None.  IMPRESSION: 1. Sludge and cholelithiasis without sonographic evidence of acute cholecystitis. 2. The echogenicity of the liver is mildly increased. This is a nonspecific finding but is most commonly seen with fatty infiltration of the liver. There are no obvious focal liver lesions.   Electronically Signed   By: Jeffrey  Waltz MD   On: 03/23/2020 09:32  Assessment & Plan:  Alonna L Buer is a 50 y.o. female with gallstones who is having continued pain and nausea. We discussed that she does have risk for gastritis but that most of her symptoms point to gallbladder.  She would like to proceed with removal.   -PLAN: I counseled the patient about the indication, risks and benefits of laparoscopic cholecystectomy.  She understands there is a very small chance for bleeding, infection, injury to normal  structures (including common bile duct), conversion to open surgery, persistent symptoms, evolution of postcholecystectomy diarrhea, need for secondary interventions, anesthesia reaction, cardiopulmonary issues and other risks not specifically detailed here. I described the expected recovery, the plan for follow-up and the restrictions during the recovery phase.  All questions were answered.  Discussed preop COVID testing.  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      Lissete Maestas C Justiss Gerbino 03/31/2020, 10:30 AM       

## 2020-04-06 NOTE — Patient Instructions (Signed)
Sheri Wood  04/06/2020     @PREFPERIOPPHARMACY @   Your procedure is scheduled on  04/08/2020.   Report to [facility] at  1050  A.M.   Call this number if you have problems the morning of surgery:  [phone]     Remember:  Do not eat or drink after midnight.                        Take these medicines the morning of surgery with A SIP OF WATER  Wellbutrin, valium, relpax, vyvanse, antivert (if needed), zofran or phenergan (if needed).      Do not wear jewelry, make-up or nail polish.  Do not wear lotions, powders, or perfumes, or deodorant.  Do not shave 48 hours prior to surgery.  Men may shave face and neck.  Do not bring valuables to the hospital.  [facility] is not responsible for any belongings or valuables.  Contacts, dentures or bridgework may not be worn into surgery.  Leave your suitcase in the car.  After surgery it may be brought to your room.  For patients admitted to the hospital, discharge time will be determined by your treatment team.  Patients discharged the day of surgery will not be allowed to drive home and must have someone with them for 24 hours.  Place clean sheets on your bed the night before your surgery and DO NOT sleep with pets this night.  Shower with CHG the night before and the morning of your surgery. DO NOT put CHG on your face, hair or genitals.  After each shower, dry off with a clean towel, put on clean comfortable clothes and brush your teeth.    Special instructions:   DO NOT smoke tobacco or vape the morning of your procedure.  Please read over the following fact sheets that you were given. Pain Booklet, Surgical Site Infection Prevention, Anesthesia Post-op Instructions and Care and Recovery After Surgery       Minimally Invasive Cholecystectomy, Care After This sheet gives you information about how to care for yourself after your procedure. Your health care provider may also give you more specific  instructions. If you have problems or questions, contact your health care provider. What can I expect after the procedure? After the procedure, it is common to have:  Pain at your incision sites. You will be given medicines to control this pain.  Mild nausea or vomiting.  Bloating and possible shoulder pain from the gas that was used during the procedure. Follow these instructions at home: Medicines  Take over-the-counter and prescription medicines only as told by your health care provider.  If you were prescribed an antibiotic medicine, take or use it as told by your health care provider. Do not stop using the antibiotic even if you start to feel better.  Ask your health care provider if the medicine prescribed to you: ? Requires you to avoid driving or using machinery. ? Can cause constipation. You may need to take these actions to prevent or treat constipation:  Drink enough fluid to keep your urine pale yellow.  Take over-the-counter or prescription medicines.  Eat foods that are high in fiber, such as beans, whole grains, and fresh fruits and vegetables.  Limit foods that are high in fat and processed sugars, such as fried or sweet foods. Incision care  Follow instructions from your health care provider about how to take care of your incisions.  Make sure you: ? Wash your hands with soap and water for at least 20 seconds before and after you change your bandage (dressing). If soap and water are not available, use hand sanitizer. ? Change your dressing as told by your health care provider. ? Leave stitches (sutures), skin glue, or adhesive strips in place. These skin closures may need to be in place for 2 weeks or longer. If adhesive strip edges start to loosen and curl up, you may trim the loose edges. Do not remove adhesive strips completely unless your health care provider tells you to do that.  Do not take baths, swim, or use a hot tub until your health care provider  approves. Ask your health care provider if you may take showers. You may only be allowed to take sponge baths.  Check your incision area every day for signs of infection. Check for: ? More redness, swelling, or pain. ? Fluid or blood. ? Warmth. ? Pus or a bad smell.   Activity  Rest as told by your health care provider.  Avoid sitting for a long time without moving. Get up to take short walks every 1-2 hours. This is important to improve blood flow and breathing. Ask for help if you feel weak or unsteady.  Do not lift anything that is heavier than 10 lb (4.5 kg), or the limit that you are told, until your health care provider says that it is safe.  Do not play contact sports until your health care provider approves.  Do not return to work or school until your health care provider approves.  Return to your normal activities as told by your health care provider. Ask your health care provider what activities are safe for you. General instructions  If you were given a sedative during the procedure, it can affect you for several hours. Do not drive or operate machinery until your health care provider says that it is safe.  Keep all follow-up visits as told by your health care provider. This is important. Contact a health care provider if:  You develop a rash.  You have more redness, swelling, or pain around your incisions.  You have fluid or blood coming from your incisions.  Your incisions feel warm to the touch.  You have pus or a bad smell coming from your incisions.  You have a fever.  One or more of your incisions breaks open. Get help right away if:  You have trouble breathing.  You have chest pain.  You have increasing pain in your shoulders.  You faint or feel dizzy when you stand.  You have severe pain in your abdomen.  You have nausea or vomiting that lasts for more than one day.  You have leg pain. Summary  After your procedure, it is common to have pain  at the incision sites. You may also have nausea or bloating.  Follow your health care provider's instructions about medicine, activity restrictions, and caring for your incision areas. Do not do activities that require a lot of effort.  Contact a health care provider if you have a fever or other signs of infection, such as more redness, swelling, or pain around the incisions.  Get help right away if you have chest pain, increasing pain in the shoulders, or trouble breathing. This information is not intended to replace advice given to you by your health care provider. Make sure you discuss any questions you have with your health care provider. Document Revised: 10/24/2018 Document Reviewed:  10/24/2018 Elsevier Patient Education  2021 [employer] General Anesthesia, Adult, Care After This sheet gives you information about how to care for yourself after your procedure. Your health care provider may also give you more specific instructions. If you have problems or questions, contact your health care provider. What can I expect after the procedure? After the procedure, the following side effects are common:  Pain or discomfort at the IV site.  Nausea.  Vomiting.  Sore throat.  Trouble concentrating.  Feeling cold or chills.  Feeling weak or tired.  Sleepiness and fatigue.  Soreness and body aches. These side effects can affect parts of the body that were not involved in surgery. Follow these instructions at home: For the time period you were told by your health care provider:  Rest.  Do not participate in activities where you could fall or become injured.  Do not drive or use machinery.  Do not drink alcohol.  Do not take sleeping pills or medicines that cause drowsiness.  Do not make important decisions or sign legal documents.  Do not take care of children on your own.   Eating and drinking  Follow any instructions from your health care provider about eating or  drinking restrictions.  When you feel hungry, start by eating small amounts of foods that are soft and easy to digest (bland), such as toast. Gradually return to your regular diet.  Drink enough fluid to keep your urine pale yellow.  If you vomit, rehydrate by drinking water, juice, or clear broth. General instructions  If you have sleep apnea, surgery and certain medicines can increase your risk for breathing problems. Follow instructions from your health care provider about wearing your sleep device: ? Anytime you are sleeping, including during daytime naps. ? While taking prescription pain medicines, sleeping medicines, or medicines that make you drowsy.  Have a responsible adult stay with you for the time you are told. It is important to have someone help care for you until you are awake and alert.  Return to your normal activities as told by your health care provider. Ask your health care provider what activities are safe for you.  Take over-the-counter and prescription medicines only as told by your health care provider.  If you smoke, do not smoke without supervision.  Keep all follow-up visits as told by your health care provider. This is important. Contact a health care provider if:  You have nausea or vomiting that does not get better with medicine.  You cannot eat or drink without vomiting.  You have pain that does not get better with medicine.  You are unable to pass urine.  You develop a skin rash.  You have a fever.  You have redness around your IV site that gets worse. Get help right away if:  You have difficulty breathing.  You have chest pain.  You have blood in your urine or stool, or you vomit blood. Summary  After the procedure, it is common to have a sore throat or nausea. It is also common to feel tired.  Have a responsible adult stay with you for the time you are told. It is important to have someone help care for you until you are awake and  alert.  When you feel hungry, start by eating small amounts of foods that are soft and easy to digest (bland), such as toast. Gradually return to your regular diet.  Drink enough fluid to keep your urine pale yellow.  Return to your  normal activities as told by your health care provider. Ask your health care provider what activities are safe for you. This information is not intended to replace advice given to you by your health care provider. Make sure you discuss any questions you have with your health care provider. Document Revised: 10/10/2019 Document Reviewed: 05/09/2019 Elsevier Patient Education  2021 [employer].

## 2020-04-07 ENCOUNTER — Encounter (HOSPITAL_COMMUNITY)
Admission: RE | Admit: 2020-04-07 | Discharge: 2020-04-07 | Disposition: A | Discharge status: Home / Self Care | Payer: BC Managed Care – PPO | Source: Ambulatory Visit | Attending: General Surgery | Admitting: General Surgery

## 2020-04-07 ENCOUNTER — Other Ambulatory Visit: Payer: Self-pay

## 2020-04-07 ENCOUNTER — Other Ambulatory Visit: Payer: Self-pay | Admitting: Nurse Practitioner

## 2020-04-07 ENCOUNTER — Encounter (HOSPITAL_COMMUNITY): Payer: Self-pay

## 2020-04-07 ENCOUNTER — Other Ambulatory Visit (HOSPITAL_COMMUNITY)
Admission: RE | Admit: 2020-04-07 | Discharge: 2020-04-07 | Disposition: A | Discharge status: Home / Self Care | Payer: BC Managed Care – PPO | Source: Ambulatory Visit | Attending: General Surgery | Admitting: General Surgery

## 2020-04-07 DIAGNOSIS — Z01812 Encounter for preprocedural laboratory examination: Secondary | ICD-10-CM | POA: Insufficient documentation

## 2020-04-07 DIAGNOSIS — K8020 Calculus of gallbladder without cholecystitis without obstruction: Secondary | ICD-10-CM | POA: Diagnosis present

## 2020-04-07 DIAGNOSIS — Z20822 Contact with and (suspected) exposure to covid-19: Secondary | ICD-10-CM | POA: Insufficient documentation

## 2020-04-07 DIAGNOSIS — Z87891 Personal history of nicotine dependence: Secondary | ICD-10-CM | POA: Diagnosis not present

## 2020-04-07 DIAGNOSIS — K824 Cholesterolosis of gallbladder: Secondary | ICD-10-CM | POA: Diagnosis not present

## 2020-04-07 LAB — PREGNANCY, URINE: Preg Test, Ur: NEGATIVE

## 2020-04-08 ENCOUNTER — Ambulatory Visit (HOSPITAL_COMMUNITY): Payer: BC Managed Care – PPO | Admitting: Anesthesiology

## 2020-04-08 ENCOUNTER — Encounter (HOSPITAL_COMMUNITY): Payer: Self-pay | Admitting: General Surgery

## 2020-04-08 ENCOUNTER — Encounter (HOSPITAL_COMMUNITY)
Admission: RE | Disposition: A | Discharge status: Home / Self Care | Payer: Self-pay | Source: Home / Self Care | Attending: General Surgery

## 2020-04-08 ENCOUNTER — Ambulatory Visit (HOSPITAL_COMMUNITY)
Admission: RE | Admit: 2020-04-08 | Discharge: 2020-04-08 | Disposition: A | Discharge status: Home / Self Care | Payer: BC Managed Care – PPO | Attending: General Surgery | Admitting: General Surgery

## 2020-04-08 DIAGNOSIS — K8020 Calculus of gallbladder without cholecystitis without obstruction: Secondary | ICD-10-CM

## 2020-04-08 DIAGNOSIS — Z20822 Contact with and (suspected) exposure to covid-19: Secondary | ICD-10-CM | POA: Insufficient documentation

## 2020-04-08 DIAGNOSIS — K824 Cholesterolosis of gallbladder: Secondary | ICD-10-CM | POA: Insufficient documentation

## 2020-04-08 DIAGNOSIS — Z87891 Personal history of nicotine dependence: Secondary | ICD-10-CM | POA: Insufficient documentation

## 2020-04-08 HISTORY — PX: CHOLECYSTECTOMY: SHX55

## 2020-04-08 LAB — SARS CORONAVIRUS 2 (TAT 6-24 HRS): SARS Coronavirus 2: NEGATIVE

## 2020-04-08 SURGERY — LAPAROSCOPIC CHOLECYSTECTOMY
Anesthesia: General | Site: Abdomen

## 2020-04-08 MED ORDER — CHLORHEXIDINE GLUCONATE CLOTH 2 % EX PADS: 6.0000 | MEDICATED_PAD | Freq: Once | CUTANEOUS | Status: DC

## 2020-04-08 MED ORDER — DEXMEDETOMIDINE (PRECEDEX) IN NS 20 MCG/5ML (4 MCG/ML) IV SYRINGE
PREFILLED_SYRINGE | INTRAVENOUS | Status: AC
  Filled 2020-04-08: qty 10

## 2020-04-08 MED ORDER — ONDANSETRON HCL 8 MG PO TABS: 8.0000 mg | ORAL_TABLET | Freq: Three times a day (TID) | ORAL | 0 refills | Status: DC | PRN

## 2020-04-08 MED ORDER — LIDOCAINE HCL (PF) 2 % IJ SOLN
INTRAMUSCULAR | Status: AC
  Filled 2020-04-08: qty 5

## 2020-04-08 MED ORDER — SUCCINYLCHOLINE CHLORIDE 200 MG/10ML IV SOSY
PREFILLED_SYRINGE | INTRAVENOUS | Status: AC
  Filled 2020-04-08: qty 20

## 2020-04-08 MED ORDER — PROMETHAZINE HCL 25 MG/ML IJ SOLN
6.2500 mg | INTRAMUSCULAR | Status: DC | PRN
  Administered 2020-04-08: 6.25 mg via INTRAVENOUS
  Filled 2020-04-08: qty 1

## 2020-04-08 MED ORDER — KETOROLAC TROMETHAMINE 30 MG/ML IJ SOLN
30.0000 mg | Freq: Once | INTRAMUSCULAR | Status: AC
  Administered 2020-04-08: 30 mg via INTRAVENOUS
  Filled 2020-04-08: qty 1

## 2020-04-08 MED ORDER — CHLORHEXIDINE GLUCONATE 0.12 % MT SOLN
OROMUCOSAL | Status: AC
  Filled 2020-04-08: qty 15

## 2020-04-08 MED ORDER — SCOPOLAMINE 1 MG/3DAYS TD PT72
1.0000 | MEDICATED_PATCH | Freq: Once | TRANSDERMAL | Status: DC
  Administered 2020-04-08: 1.5 mg via TRANSDERMAL

## 2020-04-08 MED ORDER — ROCURONIUM BROMIDE 10 MG/ML (PF) SYRINGE
PREFILLED_SYRINGE | INTRAVENOUS | Status: AC
  Filled 2020-04-08: qty 10

## 2020-04-08 MED ORDER — OXYCODONE HCL 5 MG PO TABS: 5.0000 mg | ORAL_TABLET | ORAL | 0 refills | Status: DC | PRN

## 2020-04-08 MED ORDER — HEMOSTATIC AGENTS (NO CHARGE) OPTIME
TOPICAL | Status: DC | PRN
  Administered 2020-04-08 (×2): 1 via TOPICAL

## 2020-04-08 MED ORDER — ROCURONIUM BROMIDE 100 MG/10ML IV SOLN
INTRAVENOUS | Status: DC | PRN
  Administered 2020-04-08: 50 mg via INTRAVENOUS

## 2020-04-08 MED ORDER — BUPIVACAINE HCL (PF) 0.5 % IJ SOLN
INTRAMUSCULAR | Status: AC
  Filled 2020-04-08: qty 30

## 2020-04-08 MED ORDER — BUPIVACAINE HCL (PF) 0.5 % IJ SOLN
INTRAMUSCULAR | Status: DC | PRN
  Administered 2020-04-08: 10 mL

## 2020-04-08 MED ORDER — PROPOFOL 10 MG/ML IV BOLUS
INTRAVENOUS | Status: AC
  Filled 2020-04-08: qty 20

## 2020-04-08 MED ORDER — FENTANYL CITRATE (PF) 250 MCG/5ML IJ SOLN
INTRAMUSCULAR | Status: DC | PRN
  Administered 2020-04-08: 100 ug via INTRAVENOUS
  Administered 2020-04-08 (×3): 50 ug via INTRAVENOUS

## 2020-04-08 MED ORDER — EPHEDRINE 5 MG/ML INJ
INTRAVENOUS | Status: AC
  Filled 2020-04-08: qty 10

## 2020-04-08 MED ORDER — FENTANYL CITRATE (PF) 250 MCG/5ML IJ SOLN
INTRAMUSCULAR | Status: AC
  Filled 2020-04-08: qty 5

## 2020-04-08 MED ORDER — MEPERIDINE HCL 50 MG/ML IJ SOLN
6.2500 mg | INTRAMUSCULAR | Status: DC | PRN
Start: 2020-04-08 — End: 2020-04-08

## 2020-04-08 MED ORDER — HYDROMORPHONE HCL 1 MG/ML IJ SOLN
0.2500 mg | INTRAMUSCULAR | Status: DC | PRN
  Administered 2020-04-08 (×2): 0.5 mg via INTRAVENOUS
  Filled 2020-04-08 (×2): qty 0.5

## 2020-04-08 MED ORDER — LIDOCAINE HCL (CARDIAC) PF 100 MG/5ML IV SOSY
PREFILLED_SYRINGE | INTRAVENOUS | Status: DC | PRN
  Administered 2020-04-08: 100 mg via INTRATRACHEAL

## 2020-04-08 MED ORDER — SCOPOLAMINE 1 MG/3DAYS TD PT72
MEDICATED_PATCH | TRANSDERMAL | Status: AC
  Filled 2020-04-08: qty 1

## 2020-04-08 MED ORDER — LACTATED RINGERS IV SOLN: INTRAVENOUS | Status: DC

## 2020-04-08 MED ORDER — CHLORHEXIDINE GLUCONATE 0.12 % MT SOLN
15.0000 mL | Freq: Once | OROMUCOSAL | Status: AC
  Administered 2020-04-08: 15 mL via OROMUCOSAL

## 2020-04-08 MED ORDER — DEXMEDETOMIDINE HCL 200 MCG/2ML IV SOLN
INTRAVENOUS | Status: DC | PRN
  Administered 2020-04-08 (×2): 10 ug via INTRAVENOUS
  Administered 2020-04-08: 20 ug via INTRAVENOUS

## 2020-04-08 MED ORDER — MIDAZOLAM HCL 2 MG/2ML IJ SOLN
INTRAMUSCULAR | Status: AC
  Filled 2020-04-08: qty 2

## 2020-04-08 MED ORDER — SODIUM CHLORIDE 0.9 % IR SOLN
Status: DC | PRN
  Administered 2020-04-08: 1000 mL

## 2020-04-08 MED ORDER — ORAL CARE MOUTH RINSE: 15.0000 mL | Freq: Once | OROMUCOSAL | Status: AC

## 2020-04-08 MED ORDER — MIDAZOLAM HCL 5 MG/5ML IJ SOLN
INTRAMUSCULAR | Status: DC | PRN
  Administered 2020-04-08: 2 mg via INTRAVENOUS

## 2020-04-08 MED ORDER — SODIUM CHLORIDE 0.9 % IV SOLN
1.0000 g | INTRAVENOUS | Status: AC
  Administered 2020-04-08: 1 g via INTRAVENOUS

## 2020-04-08 MED ORDER — PROPOFOL 10 MG/ML IV BOLUS
INTRAVENOUS | Status: DC | PRN
  Administered 2020-04-08: 200 mg via INTRAVENOUS

## 2020-04-08 MED ORDER — SUGAMMADEX SODIUM 200 MG/2ML IV SOLN
INTRAVENOUS | Status: DC | PRN
  Administered 2020-04-08: 200 mg via INTRAVENOUS

## 2020-04-08 MED ORDER — ONDANSETRON HCL 4 MG/2ML IJ SOLN
INTRAMUSCULAR | Status: DC | PRN
  Administered 2020-04-08: 4 mg via INTRAVENOUS

## 2020-04-08 SURGICAL SUPPLY — 47 items
ADH SKN CLS APL DERMABOND .7 (GAUZE/BANDAGES/DRESSINGS) ×1
APL PRP STRL LF DISP 70% ISPRP (MISCELLANEOUS) ×1
APL SRG 38 LTWT LNG FL B (MISCELLANEOUS) ×1
APPLICATOR ARISTA FLEXITIP XL (MISCELLANEOUS) ×2 IMPLANT
APPLIER CLIP ROT 10 11.4 M/L (STAPLE) ×2
APR CLP MED LRG 11.4X10 (STAPLE) ×1
BAG RETRIEVAL 10 (BASKET) ×1
BLADE SURG 15 STRL LF DISP TIS (BLADE) ×1 IMPLANT
BLADE SURG 15 STRL SS (BLADE) ×2
CHLORAPREP W/TINT 26 (MISCELLANEOUS) ×2 IMPLANT
CLIP APPLIE ROT 10 11.4 M/L (STAPLE) ×1 IMPLANT
CLOTH BEACON ORANGE TIMEOUT ST (SAFETY) ×2 IMPLANT
COVER LIGHT HANDLE STERIS (MISCELLANEOUS) ×4 IMPLANT
COVER WAND RF STERILE (DRAPES) ×2 IMPLANT
DECANTER SPIKE VIAL GLASS SM (MISCELLANEOUS) ×2 IMPLANT
DERMABOND ADVANCED (GAUZE/BANDAGES/DRESSINGS) ×1
DERMABOND ADVANCED .7 DNX12 (GAUZE/BANDAGES/DRESSINGS) ×1 IMPLANT
ELECT REM PT RETURN 9FT ADLT (ELECTROSURGICAL) ×2
ELECTRODE REM PT RTRN 9FT ADLT (ELECTROSURGICAL) ×1 IMPLANT
GLOVE SURG ENC MOIS LTX SZ6.5 (GLOVE) ×2 IMPLANT
GLOVE SURG ENC TEXT LTX SZ7 (GLOVE) ×4 IMPLANT
GLOVE SURG SS PI 7.5 STRL IVOR (GLOVE) ×2 IMPLANT
GLOVE SURG UNDER POLY LF SZ6.5 (GLOVE) ×2 IMPLANT
GLOVE SURG UNDER POLY LF SZ7 (GLOVE) ×6 IMPLANT
GOWN STRL REUS W/TWL LRG LVL3 (GOWN DISPOSABLE) ×6 IMPLANT
GOWN STRL REUS W/TWL XL LVL3 (GOWN DISPOSABLE) ×2 IMPLANT
HEMOSTAT ARISTA ABSORB 3G PWDR (HEMOSTASIS) ×2 IMPLANT
HEMOSTAT SNOW SURGICEL 2X4 (HEMOSTASIS) ×2 IMPLANT
INST SET LAPROSCOPIC AP (KITS) ×2 IMPLANT
KIT TURNOVER KIT A (KITS) ×2 IMPLANT
MANIFOLD NEPTUNE II (INSTRUMENTS) ×2 IMPLANT
NEEDLE INSUFFLATION 14GA 120MM (NEEDLE) ×2 IMPLANT
NS IRRIG 1000ML POUR BTL (IV SOLUTION) ×2 IMPLANT
PACK LAP CHOLE LZT030E (CUSTOM PROCEDURE TRAY) ×2 IMPLANT
PAD ARMBOARD 7.5X6 YLW CONV (MISCELLANEOUS) ×2 IMPLANT
SET BASIN LINEN APH (SET/KITS/TRAYS/PACK) ×2 IMPLANT
SET TUBE SMOKE EVAC HIGH FLOW (TUBING) ×2 IMPLANT
SLEEVE ENDOPATH XCEL 5M (ENDOMECHANICALS) ×2 IMPLANT
SUT MNCRL AB 4-0 PS2 18 (SUTURE) ×4 IMPLANT
SUT VICRYL 0 UR6 27IN ABS (SUTURE) ×2 IMPLANT
SYS BAG RETRIEVAL 10MM (BASKET) ×1
SYSTEM BAG RETRIEVAL 10MM (BASKET) ×1 IMPLANT
TROCAR ENDO BLADELESS 11MM (ENDOMECHANICALS) ×2 IMPLANT
TROCAR XCEL NON-BLD 5MMX100MML (ENDOMECHANICALS) ×2 IMPLANT
TROCAR XCEL UNIV SLVE 11M 100M (ENDOMECHANICALS) ×2 IMPLANT
TUBE CONNECTING 12X1/4 (SUCTIONS) ×2 IMPLANT
WARMER LAPAROSCOPE (MISCELLANEOUS) ×2 IMPLANT

## 2020-04-08 NOTE — Anesthesia Preprocedure Evaluation (Addendum)
Anesthesia Evaluation  Patient identified by MRN, date of birth, ID band Patient awake    Reviewed: Allergy & Precautions, NPO status , Patient's Chart, lab work & pertinent test results  History of Anesthesia Complications Negative for: history of anesthetic complications  Airway Mallampati: II  TM Distance: >3 FB Neck ROM: Full    Dental  (+) Dental Advisory Given Crowns :   Pulmonary former smoker,    Pulmonary exam normal breath sounds clear to auscultation       Cardiovascular Exercise Tolerance: Good Normal cardiovascular exam Rhythm:Regular Rate:Normal     Neuro/Psych  Headaches, PSYCHIATRIC DISORDERS Anxiety Depression  Neuromuscular disease    GI/Hepatic Neg liver ROS, GERD  Medicated and Controlled,  Endo/Other  negative endocrine ROS  Renal/GU negative Renal ROS     Musculoskeletal  (+) Fibromyalgia -  Abdominal   Peds  Hematology negative hematology ROS (+)   Anesthesia Other Findings   Reproductive/Obstetrics                            Anesthesia Physical Anesthesia Plan  ASA: II  Anesthesia Plan: General   Post-op Pain Management:    Induction: Intravenous  PONV Risk Score and Plan: Ondansetron, Midazolam, Dexamethasone and Scopolamine patch - Pre-op  Airway Management Planned: Oral ETT  Additional Equipment:   Intra-op Plan:   Post-operative Plan:   Informed Consent: I have reviewed the patients History and Physical, chart, labs and discussed the procedure including the risks, benefits and alternatives for the proposed anesthesia with the patient or authorized representative who has indicated his/her understanding and acceptance.     Dental advisory given  Plan Discussed with: Surgeon and CRNA  Anesthesia Plan Comments:        Anesthesia Quick Evaluation

## 2020-04-08 NOTE — Discharge Instructions (Signed)
Discharge Laparoscopic Surgery Instructions:  Common Complaints: Right shoulder pain is common after laparoscopic surgery. This is secondary to the gas used in the surgery being trapped under the diaphragm.  Walk to help your body absorb the gas. This will improve in a few days. Pain at the port sites are common, especially the larger port sites. This will improve with time.  Some nausea is common and poor appetite. The main goal is to stay hydrated the first few days after surgery.   Diet/ Activity: Diet as tolerated. You may not have an appetite, but it is important to stay hydrated. Drink 64 ounces of water a day. Your appetite will return with time.  Shower per your regular routine daily.  Do not take hot showers. Take warm showers that are less than 10 minutes. Rest and listen to your body, but do not remain in bed all day.  Walk everyday for at least 15-20 minutes. Deep cough and move around every 1-2 hours in the first few days after surgery.  Do not lift > 10 lbs, perform excessive bending, pushing, pulling, squatting for 1-2 weeks after surgery.  Do not pick at the dermabond glue on your incision sites.  This glue film will remain in place for 1-2 weeks and will start to peel off.  Do not place lotions or balms on your incision unless instructed to specifically by Dr. Kenady Doxtater.   Pain Expectations and Narcotics: -After surgery you will have pain associated with your incisions and this is normal. The pain is muscular and nerve pain, and will get better with time. -You are encouraged and expected to take non narcotic medications like tylenol and ibuprofen (when able) to treat pain as multiple modalities can aid with pain treatment. -Narcotics are only used when pain is severe or there is breakthrough pain. -You are not expected to have a pain score of 0 after surgery, as we cannot prevent pain. A pain score of 3-4 that allows you to be functional, move, walk, and tolerate some activity is  the goal. The pain will continue to improve over the days after surgery and is dependent on your surgery. -Due to [place] law, we are only able to give a certain amount of pain medication to treat post operative pain, and we only give additional narcotics on a patient by patient basis.  -For most laparoscopic surgery, studies have shown that the majority of patients only need 10-15 narcotic pills, and for open surgeries most patients only need 15-20.   -Having appropriate expectations of pain and knowledge of pain management with non narcotics is important as we do not want anyone to become addicted to narcotic pain medication.  -Using ice packs in the first 48 hours and heating pads after 48 hours, wearing an abdominal binder (when recommended), and using over the counter medications are all ways to help with pain management.   -Simple acts like meditation and mindfulness practices after surgery can also help with pain control and research has proven the benefit of these practices.  Medication: Take tylenol and ibuprofen as needed for pain control, alternating every 4-6 hours.  Example:  Tylenol 1000mg @ 6am, 12noon, 6pm, 12midnight (Do not exceed 4000mg of tylenol a day). Ibuprofen 800mg @ 9am, 3pm, 9pm, 3am (Do not exceed 3600mg of ibuprofen a day).  Take Roxicodone for breakthrough pain every 4 hours.  Take Colace for constipation related to narcotic pain medication. If you do not have a bowel movement in 2 days, take Miralax   over the counter.  Drink plenty of water to also prevent constipation.   Contact Information: If you have questions or concerns, please call our office, [phone], Monday- Thursday 8AM-5PM and Friday 8AM-12Noon.  If it is after hours or on the weekend, please call [facility], [phone], [phone], and ask to speak to the surgeon on call for Dr. Henreitta Leber at [facility].    Minimally Invasive Cholecystectomy, Care After This sheet gives you information about how  to care for yourself after your procedure. Your doctor may also give you more specific instructions. If you have problems or questions, contact your doctor. What can I expect after the procedure? After the procedure, it is common:  To have pain at the areas of surgery. You will be given medicines for pain.  To vomit or feel like you may vomit.  To feel fullness in the belly (bloating) or to have pain in the shoulder. This comes from the gas that was used during the surgery. Follow these instructions at home: Medicines  Take over-the-counter and prescription medicines only as told by your doctor.  If you were prescribed an antibiotic medicine, take it as told by your doctor. Do not stop using the antibiotic even if you start to feel better.  Ask your doctor if the medicine prescribed to you: ? Requires you to avoid driving or using machinery. ? Can cause trouble pooping (constipation). You may need to take these actions to prevent or treat trouble pooping:  Drink enough fluid to keep your pee (urine) pale yellow.  Take over-the-counter or prescription medicines.  Eat foods that are high in fiber. These include beans, whole grains, and fresh fruits and vegetables.  Limit foods that are high in fat and sugar. These include fried or sweet foods. Incision care  Follow instructions from your doctor about how to take care of your cuts from surgery (incisions). Make sure you: ? Wash your hands with soap and water for at least 20 seconds before and after you change your bandage (dressing). If you cannot use soap and water, use hand sanitizer. ? Change your bandage as told by your doctor. ? Leave stitches (sutures), skin glue, or skin tape (adhesive) strips in place. They may need to stay in place for 2 weeks or longer. If tape strips get loose and curl up, you may trim the loose edges. Do not remove tape strips completely unless your doctor says it is okay.  Do not take baths, swim, or use a  hot tub until your doctor approves. You may shower.  Check your surgery area every day for signs of infection. Check for: ? More redness, swelling, or pain. ? Fluid or blood. ? Warmth. ? Pus or a bad smell.   Activity  Rest as told by your doctor.  Do not sit for a long time without moving. Get up to take short walks every 1-2 hours. This is important. Ask for help if you feel weak or unsteady.  Do not lift anything that is heavier than 10 lb (4.5 kg), or the limit that you are told, until your doctor says that it is safe.  Do not play contact sports until your doctor says it is okay.  Do not return to work or school until your doctor says it is okay.  Return to your normal activities as told by your doctor. Ask your doctor what activities are safe for you. General instructions  If you were given a medicine to help you relax (sedative)  during your procedure, it can affect you for many hours. Do not drive or use machinery until your doctor says that it is safe.  Keep all follow-up visits as told by your doctor. This is important. Contact a doctor if:  You get a rash.  You have more redness, swelling, or pain around your cuts from surgery.  You have fluid or blood coming from your cuts from surgery.  Your cuts from surgery feel warm to the touch.  You have pus or a bad smell coming from your cuts from surgery.  You have a fever.  One or more of your cuts from surgery breaks open. Get help right away if:  You have trouble breathing.  You have chest pain.  You have pain that is getting worse in your shoulders.  You faint or feel dizzy when you stand.  You have very bad pain in your belly (abdomen).  You feel like you may vomit or you vomit, and this lasts for more than one day.  You have leg pain. Summary  After your surgery, it is common to have pain at the areas of surgery. You may also have vomiting or fullness in the belly.  Follow your doctor's instructions  about medicine, activity restrictions, and caring for your surgery areas. Do not do activities that require a lot of effort.  Contact a doctor if you have a fever or other signs of infection, such as more redness, swelling, or pain around the cuts from surgery.  Get help right away if you have chest pain, increasing pain in the shoulders, or trouble breathing. This information is not intended to replace advice given to you by your health care provider. Make sure you discuss any questions you have with your health care provider. Document Revised: 01/08/2019 Document Reviewed: 01/08/2019 Elsevier Patient Education  2021 Elsevier Inc.  

## 2020-04-08 NOTE — Anesthesia Postprocedure Evaluation (Signed)
Anesthesia Post Note  Patient: CAMERAN AHMED  Procedure(s) Performed: LAPAROSCOPIC CHOLECYSTECTOMY (N/A Abdomen)  Patient location during evaluation: PACU Anesthesia Type: General Level of consciousness: awake Pain management: pain level controlled Vital Signs Assessment: post-procedure vital signs reviewed and stable Respiratory status: spontaneous breathing Cardiovascular status: stable Postop Assessment: no apparent nausea or vomiting Anesthetic complications: no   No complications documented.   Last Vitals:  Vitals:   04/08/20 1145 04/08/20 1210  BP: 123/71 117/62  Pulse: 71 67  Resp: 14   Temp:  36.6 C  SpO2: 99% 100%    Last Pain:  Vitals:   04/08/20 1210  TempSrc: Oral  PainSc:                  Aneesa Romey Hristova

## 2020-04-08 NOTE — Progress Notes (Signed)
To whom it may concern,  Sheri Wood will be out of work from 04/07/2020 until 04/20/2020. She has surgery and is recovering.  If you have any questions please call the office. She can go back to work without restrictions on 04/20/2020.  Algis Greenhouse, MD [facility] [address] [place] [place], [place] [address] [phone] (office)

## 2020-04-08 NOTE — Interval H&P Note (Signed)
History and Physical Interval Note:  04/08/2020 8:55 AM  Sheri Wood  has presented today for surgery, with the diagnosis of Cholelithiasis.  The various methods of treatment have been discussed with the patient and family. After consideration of risks, benefits and other options for treatment, the patient has consented to  Procedure(s): LAPAROSCOPIC CHOLECYSTECTOMY (N/A) as a surgical intervention.  [name] history has been reviewed, patient examined, no change in status, stable for surgery.  I have reviewed the patient's chart and labs.  Questions were answered to the patient's satisfaction.    No changes.   [name]

## 2020-04-08 NOTE — Progress Notes (Signed)
[facility]  Spoke with patient's husband and notified surgery completed. Rx sent to [facility], Note for work given. If she decides she wants to return back sooner than 3/14 she will let the office know.  Post op phone call 3/17.  Algis Greenhouse, MD [facility] [address] [place], [place] [address] [phone] (office)

## 2020-04-08 NOTE — Transfer of Care (Signed)
Immediate Anesthesia Transfer of Care Note  Patient: Sheri Wood  Procedure(s) Performed: LAPAROSCOPIC CHOLECYSTECTOMY (N/A Abdomen)  Patient Location: PACU  Anesthesia Type:General  Level of Consciousness: awake and confused  Airway & Oxygen Therapy: Patient Spontanous Breathing  Post-op Assessment: Report given to RN and Post -op Vital signs reviewed and stable  Post vital signs: Reviewed and stable  Last Vitals:  Vitals Value Taken Time  BP 140/75 04/08/20 1033  Temp    Pulse 72 04/08/20 1036  Resp 14 04/08/20 1036  SpO2 99 % 04/08/20 1036  Vitals shown include unvalidated device data.  Last Pain:  Vitals:   04/08/20 0836  TempSrc: Oral  PainSc: 0-No pain      Patients Stated Pain Goal: 8 (04/08/20 0836)  Complications: No complications documented.

## 2020-04-08 NOTE — Op Note (Signed)
Operative Note   Preoperative Diagnosis: Symptomatic cholelithiasis   Postoperative Diagnosis: Same   Procedure(s) Performed: Laparoscopic cholecystectomy   Surgeon: Lillia Abed C. Henreitta Leber, MD   Assistants: Franky Macho, MD    Anesthesia: General endotracheal   Anesthesiologist: Molli Barrows, MD    Specimens: Gallbladder    Estimated Blood Loss: Minimal    Blood Replacement: None    Complications: None    Operative Findings:  Distended gallbladder   Procedure: The patient was taken to the operating room and placed supine. General endotracheal anesthesia was induced. Intravenous antibiotics were administered per protocol. An orogastric tube positioned to decompress the stomach. The abdomen was prepared and draped in the usual sterile fashion.    A supraumbilical incision was made and a Veress technique was utilized to achieve pneumoperitoneum to 15 mmHg with carbon dioxide. A 11 mm optiview port was placed through the supraumbilical region, and a 10 mm 0-degree operative laparoscope was introduced. The area underlying the trocar and Veress needle were inspected and without evidence of injury.  Remaining trocars were placed under direct vision. Two 5 mm ports were placed in the right abdomen, between the anterior axillary and midclavicular line.  A final 11 mm port was placed through the mid-epigastrium, near the falciform ligament.    The gallbladder fundus was elevated cephalad and the infundibulum was retracted to the patient's right. The gallbladder/cystic duct junction was skeletonized. The cystic artery noted in the triangle of Calot and was also skeletonized.  We then continued liberal medial and lateral dissection until the critical view of safety was achieved.    The cystic duct and cystic artery were triply clipped and divided. There was a small posterior vein or duct < 15mm that was clipped.  The gallbladder was then dissected from the liver bed with electrocautery. The  specimen was placed in an Endopouch and was retrieved through the epigastric site.   Final inspection revealed acceptable hemostasis. Arista and Surgical SNOW was placed in the gallbladder bed.  Trocars were removed and pneumoperitoneum was released.  0 Vicryl fascial sutures were used to close the epigastric and umbilical port sites. Skin incisions were closed with 4-0 Monocryl subcuticular sutures and Dermabond. The patient was awakened from anesthesia and extubated without complication.    Algis Greenhouse, MD [facility] [address] [place], [place] [address] [phone] (office)

## 2020-04-08 NOTE — Anesthesia Procedure Notes (Signed)
Procedure Name: Intubation Date/Time: 04/08/2020 9:31 AM Performed by: Hewitt Blade, CRNA Pre-anesthesia Checklist: Patient identified, Emergency Drugs available, Suction available and Patient being monitored Patient Re-evaluated:Patient Re-evaluated prior to induction Oxygen Delivery Method: Circle system utilized Preoxygenation: Pre-oxygenation with 100% oxygen Induction Type: IV induction Ventilation: Mask ventilation without difficulty Laryngoscope Size: Mac and 3 Grade View: Grade I Tube type: Oral Tube size: 7.0 mm Number of attempts: 1 Airway Equipment and Method: Stylet and Oral airway Placement Confirmation: ETT inserted through vocal cords under direct vision,  positive ETCO2 and breath sounds checked- equal and bilateral Secured at: 21 cm Tube secured with: Tape Dental Injury: Teeth and Oropharynx as per pre-operative assessment

## 2020-04-09 ENCOUNTER — Encounter (HOSPITAL_COMMUNITY): Payer: Self-pay | Admitting: General Surgery

## 2020-04-09 LAB — SURGICAL PATHOLOGY

## 2020-04-11 ENCOUNTER — Other Ambulatory Visit (HOSPITAL_COMMUNITY): Payer: Self-pay | Admitting: Psychiatry

## 2020-04-13 NOTE — Telephone Encounter (Signed)
Call for appt

## 2020-04-21 ENCOUNTER — Ambulatory Visit (INDEPENDENT_AMBULATORY_CARE_PROVIDER_SITE_OTHER): Payer: BC Managed Care – PPO | Admitting: General Surgery

## 2020-04-21 DIAGNOSIS — K8020 Calculus of gallbladder without cholecystitis without obstruction: Secondary | ICD-10-CM

## 2020-04-21 NOTE — Progress Notes (Signed)
[facility]  I am calling the patient for post operative evaluation. This is not a billable encounter as it is under the global charges for the surgery.  The patient had a laparoscopic cholecystectomy on 04/08/2020. The patient reports that she is still with nausea and having bloating is doing fair but has some soreness.  The are tolerating a diet, having good pain control, and having regular Bms.  The incisions are healing. The patient has no major concerns. She may need to see Gi in the future if she still having issues. She is anxious about her mom's history of pancreatic cancer.  I told her we are not able to see anything about the pancreas during gallbladder surgery. Her [place] was a limited [place] preop.  She may need additional studies/ evaluation if symptoms persist.   She will let us know if there is anyway we can help.   Pathology: FINAL MICROSCOPIC DIAGNOSIS:   A. GALLBLADDER, CHOLECYSTECTOMY:  - Cholesterolosis with small cholesterol polyps.   Will see the patient PRN.   Algis Greenhouse, MD [facility] [address] [place], [place] [address] [phone] (office)

## 2020-04-26 ENCOUNTER — Telehealth: Payer: BC Managed Care – PPO | Admitting: Physician Assistant

## 2020-04-26 DIAGNOSIS — R300 Dysuria: Secondary | ICD-10-CM | POA: Diagnosis not present

## 2020-04-26 DIAGNOSIS — R399 Unspecified symptoms and signs involving the genitourinary system: Secondary | ICD-10-CM

## 2020-04-26 MED ORDER — SULFAMETHOXAZOLE-TRIMETHOPRIM 800-160 MG PO TABS: 1.0000 | ORAL_TABLET | Freq: Two times a day (BID) | ORAL | 0 refills | Status: DC

## 2020-04-26 NOTE — Progress Notes (Signed)
Hi Cesar,   I am sorry you aren't feeling well. I see you have had multiple complaints of UTI symptoms in the past few years. I will treat you today, but please follow-up with your PCP for this problem.  It is very important to get a urine culture while having these symptoms (prior to treatment with antibitoics), so we know we are providing the most appropriate treatment.    A UTI (Urinary Tract Infection) is a bacterial infection of the bladder.  Most cases of urinary tract infections are simple to treat but a key part of your care is to encourage you to drink plenty of fluids and watch your symptoms carefully.  I have prescribed Bactrim DS One tablet twice a day for 5 days.  Your symptoms should gradually improve. Call us if the burning in your urine worsens, you develop worsening fever, back pain or pelvic pain or if your symptoms do not resolve after completing the antibiotic.  Urinary tract infections can be prevented by drinking plenty of water to keep your body hydrated.  Also be sure when you wipe, wipe from front to back and don't hold it in!  If possible, empty your bladder every 4 hours.  Your e-visit answers were reviewed by a board certified advanced clinical practitioner to complete your personal care plan.  Depending on the condition, your plan could have included both over the counter or prescription medications.  If there is a problem please reply  once you have received a response from your provider.  Your safety is important to [place].  If you have drug allergies check your prescription carefully.    You can use MyChart to ask questions about today's visit, request a non-urgent call back, or ask for a work or school excuse for 24 hours related to this e-Visit. If it has been greater than 24 hours you will need to follow up with your provider, or enter a new e-Visit to address those concerns.   You will get an e-mail in the next two days asking about your experience.  I hope  that your e-visit has been valuable and will speed your recovery. Thank you for using e-visits.   Greater than 5 minutes, yet less than 10 minutes of time have been spent researching, coordinating and implementing care for this patient today.

## 2020-04-27 ENCOUNTER — Other Ambulatory Visit: Payer: Self-pay

## 2020-05-07 ENCOUNTER — Ambulatory Visit (INDEPENDENT_AMBULATORY_CARE_PROVIDER_SITE_OTHER): Payer: Self-pay | Admitting: General Surgery

## 2020-05-07 ENCOUNTER — Other Ambulatory Visit: Payer: Self-pay | Admitting: Family Medicine

## 2020-05-07 ENCOUNTER — Encounter: Payer: Self-pay | Admitting: General Surgery

## 2020-05-07 ENCOUNTER — Other Ambulatory Visit: Payer: Self-pay

## 2020-05-07 VITALS — BP 87/66 | HR 100 | Temp 97.6°F | Resp 12 | Ht 67.0 in | Wt 189.0 lb

## 2020-05-07 DIAGNOSIS — R112 Nausea with vomiting, unspecified: Secondary | ICD-10-CM

## 2020-05-07 DIAGNOSIS — K8020 Calculus of gallbladder without cholecystitis without obstruction: Secondary | ICD-10-CM

## 2020-05-07 LAB — CBC WITH DIFFERENTIAL/PLATELET
Absolute Monocytes: 737 cells/uL (ref 200–950)
Basophils Absolute: 38 cells/uL (ref 0–200)
Basophils Relative: 0.6 %
Eosinophils Absolute: 233 cells/uL (ref 15–500)
Eosinophils Relative: 3.7 %
HCT: 47.5 % — ABNORMAL HIGH (ref 35.0–45.0)
Hemoglobin: 15.9 g/dL — ABNORMAL HIGH (ref 11.7–15.5)
Lymphs Abs: 1021 cells/uL (ref 850–3900)
MCH: 29.3 pg (ref 27.0–33.0)
MCHC: 33.5 g/dL (ref 32.0–36.0)
MCV: 87.6 fL (ref 80.0–100.0)
MPV: 11.5 fL (ref 7.5–12.5)
Monocytes Relative: 11.7 %
Neutro Abs: 4271 cells/uL (ref 1500–7800)
Neutrophils Relative %: 67.8 %
Platelets: 271 10*3/uL (ref 140–400)
RBC: 5.42 10*6/uL — ABNORMAL HIGH (ref 3.80–5.10)
RDW: 12.4 % (ref 11.0–15.0)
Total Lymphocyte: 16.2 %
WBC: 6.3 10*3/uL (ref 3.8–10.8)

## 2020-05-07 LAB — COMPREHENSIVE METABOLIC PANEL
AG Ratio: 1.5 (calc) (ref 1.0–2.5)
ALT: 95 U/L — ABNORMAL HIGH (ref 6–29)
AST: 83 U/L — ABNORMAL HIGH (ref 10–35)
Albumin: 4 g/dL (ref 3.6–5.1)
Alkaline phosphatase (APISO): 68 U/L (ref 37–153)
BUN: 9 mg/dL (ref 7–25)
CO2: 26 mmol/L (ref 20–32)
Calcium: 8.7 mg/dL (ref 8.6–10.4)
Chloride: 103 mmol/L (ref 98–110)
Creat: 0.95 mg/dL (ref 0.50–1.05)
Globulin: 2.7 g/dL (calc) (ref 1.9–3.7)
Glucose, Bld: 90 mg/dL (ref 65–139)
Potassium: 4.2 mmol/L (ref 3.5–5.3)
Sodium: 140 mmol/L (ref 135–146)
Total Bilirubin: 1.1 mg/dL (ref 0.2–1.2)
Total Protein: 6.7 g/dL (ref 6.1–8.1)

## 2020-05-07 MED ORDER — ONDANSETRON HCL 4 MG PO TABS: 4.0000 mg | ORAL_TABLET | Freq: Three times a day (TID) | ORAL | 0 refills | Status: DC | PRN

## 2020-05-07 NOTE — Patient Instructions (Signed)
Will call with labs. Stay hydrated. BRAT diet for diarrhea  Food Choices to Help Relieve Diarrhea, Adult Diarrhea can make you feel weak and cause you to become dehydrated. It is important to choose the right foods and drinks to:  Relieve diarrhea.  Replace lost fluids and nutrients.  Prevent dehydration. What are tips for following this plan? Relieving diarrhea  Avoid foods that make your diarrhea worse. These may include: ? Foods and beverages sweetened with high-fructose corn syrup, honey, or sweeteners such as xylitol, sorbitol, and mannitol. ? Fried, greasy, or spicy foods. ? Raw fruits and vegetables.  Eat foods that are rich in probiotics. These include foods such as yogurt and fermented milk products. Probiotics can help increase healthy bacteria in your stomach and intestines (gastrointestinal tract or GI tract). This may help digestion and stop diarrhea.  If you have lactose intolerance, avoid dairy products. These may make your diarrhea worse.  Take medicine to help stop diarrhea only as told by your health care provider. Replacing nutrients  Eat bland, easy-to-digest foods in small amounts as you are able, until your diarrhea starts to get better. These foods include bananas, applesauce, rice, toast, and crackers.  Gradually reintroduce nutrient-rich foods as tolerated or as told by your health care provider. This includes: ? Well-cooked protein foods, such as eggs, lean meats like fish or chicken without skin, and tofu. ? Peeled, seeded, and soft-cooked fruits and vegetables. ? Low-fat dairy products. ? Whole grains.  Take vitamin and mineral supplements as told by your health care provider.   Preventing dehydration  Start by sipping water or a solution to prevent dehydration (oral rehydration solution, ORS). This is a drink that helps replace fluids and minerals your body has lost. You can buy an ORS at pharmacies and retail stores.  Try to drink at least 8-10  cups (2,000-2,500 mL) of fluid each day to help replace lost fluids. If you have urine that is pale yellow, you are getting enough fluids.  You may drink other liquids in addition to water, such as fruit juice that you have added water to (diluted fruit juice) or low-calorie sports drinks, as tolerated or as told by your health care provider.  Avoid drinks with caffeine, such as coffee, tea, or soft drinks.  Avoid alcohol.   Summary  When you have diarrhea, it is important to choose the right foods and drinks to relieve diarrhea, to replace lost fluids and nutrients, and to prevent dehydration.  Make sure you drink enough fluid to keep your urine pale yellow.  You may benefit from eating bland foods at first. Gradually reintroduce healthy, nutrient-rich foods as tolerated or as told by your health care provider.  Avoid foods that make your diarrhea worse, such as fried, greasy, or spicy foods. This information is not intended to replace advice given to you by your health care provider. Make sure you discuss any questions you have with your health care provider. Document Revised: 03/12/2019 Document Reviewed: 03/12/2019 Elsevier Patient Education  2021 [employer].

## 2020-05-08 ENCOUNTER — Telehealth (INDEPENDENT_AMBULATORY_CARE_PROVIDER_SITE_OTHER): Payer: BC Managed Care – PPO | Admitting: General Surgery

## 2020-05-08 DIAGNOSIS — R112 Nausea with vomiting, unspecified: Secondary | ICD-10-CM

## 2020-05-08 NOTE — Progress Notes (Signed)
[facility] Note   HPI:  50 y.o. Female presents to clinic for post-op follow-up evaluation of cholecystectomy who is having continued nausea and then started to have some diarrhea but this is 2 weeks out from her surgery. She says that she worse starting to feel better and said that this came on all of a sudden with the diarrhea. She takes some zofran for nausea.   Review of Systems:  No fever or chills  All other review of systems: otherwise negative   Vital Signs:  BP (!) 87/66   Pulse 100   Temp 97.6 F (36.4 C) (Other (Comment))   Resp 12   Ht 5\' 7"  (1.702 m)   Wt 189 lb (85.7 kg)   SpO2 95%   BMI 29.60 kg/m    Physical Exam:  Physical Exam Vitals reviewed.  Cardiovascular:     Rate and Rhythm: Normal rate.  Pulmonary:     Effort: Pulmonary effort is normal.  Abdominal:     General: There is no distension.     Palpations: Abdomen is soft.     Tenderness: There is no abdominal tenderness.    Assessment:  50 y.o. yo Female with post op nausea and vomiting but now with some diarrhea too. She may have a GI bug on top of some post op nausea. Labs ordered and pending.  Plan:  Will call with labs. Stay hydrated. BRAT diet for diarrhea     43, MD [facility] [address] [place], [place] [phone] (office)

## 2020-05-08 NOTE — Telephone Encounter (Signed)
[facility]  Labs overall reassuring. No WBC. No elevated bilirubin or alk phos. Mildly elevated AST ALT could be from gastroenteritis, will follow up in a few weeks. Possibly a little dehydrated with hemoconcentration.   Left message.   Curlene Labrum, MD [facility] [address] [place], [place] [address] [phone] (office)    Results for Sheri Wood, Sheri Wood (MRN [id number]) as of 05/08/2020 13:43  Ref. Range 05/07/2020 10:33  Sodium Latest Ref Range: 135 - 146 mmol/L 140  Potassium Latest Ref Range: 3.5 - 5.3 mmol/L 4.2  Chloride Latest Ref Range: 98 - 110 mmol/L 103  CO2 Latest Ref Range: 20 - 32 mmol/L 26  Glucose Latest Ref Range: 65 - 139 mg/dL 90  BUN Latest Ref Range: 7 - 25 mg/dL 9  Creatinine Latest Ref Range: 0.50 - 1.05 mg/dL 0.95  Calcium Latest Ref Range: 8.6 - 10.4 mg/dL 8.7  BUN/Creatinine Ratio Latest Ref Range: 6 - 22 (calc) NOT APPLICABLE  AG Ratio Latest Ref Range: 1.0 - 2.5 (calc) 1.5  AST Latest Ref Range: 10 - 35 U/L 83 (H)  ALT Latest Ref Range: 6 - 29 U/L 95 (H)  Total Protein Latest Ref Range: 6.1 - 8.1 g/dL 6.7  Total Bilirubin Latest Ref Range: 0.2 - 1.2 mg/dL 1.1  Alkaline phosphatase (APISO) Latest Ref Range: 37 - 153 U/L 68  Globulin Latest Ref Range: 1.9 - 3.7 g/dL (calc) 2.7  WBC Latest Ref Range: 3.8 - 10.8 Thousand/uL 6.3  RBC Latest Ref Range: 3.80 - 5.10 Million/uL 5.42 (H)  Hemoglobin Latest Ref Range: 11.7 - 15.5 g/dL 15.9 (H)  HCT Latest Ref Range: 35.0 - 45.0 % 47.5 (H)  MCV Latest Ref Range: 80.0 - 100.0 fL 87.6  MCH Latest Ref Range: 27.0 - 33.0 pg 29.3  MCHC Latest Ref Range: 32.0 - 36.0 g/dL 33.5  RDW Latest Ref Range: 11.0 - 15.0 % 12.4  Platelets Latest Ref Range: 140 - 400 Thousand/uL 271  MPV Latest Ref Range: 7.5 - 12.5 fL 11.5  Neutrophils Latest Units: % 67.8  Monocytes Relative Latest Units: % 11.7  Eosinophil Latest Units: % 3.7  Basophil Latest Units: % 0.6  NEUT#  Latest Ref Range: 1,500 - 7,800 cells/uL 4,271  Lymphocyte # Latest Ref Range: 850 - 3,900 cells/uL 1,021  Total Lymphocyte Latest Units: % 16.2  Eosinophils Absolute Latest Ref Range: 15 - 500 cells/uL 233  Basophils Absolute Latest Ref Range: 0 - 200 cells/uL 38  Absolute Monocytes Latest Ref Range: 200 - 950 cells/uL 737

## 2020-05-11 NOTE — Telephone Encounter (Signed)
Noted  

## 2020-05-12 ENCOUNTER — Ambulatory Visit (INDEPENDENT_AMBULATORY_CARE_PROVIDER_SITE_OTHER): Payer: BC Managed Care – PPO | Admitting: General Surgery

## 2020-05-12 ENCOUNTER — Encounter: Payer: Self-pay | Admitting: General Surgery

## 2020-05-12 ENCOUNTER — Other Ambulatory Visit: Payer: Self-pay

## 2020-05-12 VITALS — BP 108/69 | HR 88 | Temp 98.0°F | Resp 14 | Ht 67.0 in | Wt 195.0 lb

## 2020-05-12 DIAGNOSIS — R112 Nausea with vomiting, unspecified: Secondary | ICD-10-CM

## 2020-05-12 NOTE — Progress Notes (Signed)
[facility] Note   HPI:  50 y.o. Female presents to clinic for post-op follow-up evaluation after laparoscopic cholecystectomy. Patient reports the nausea and diarrhea are better. LFTs were a little up. Nothing else concerning on labs. She was worried about her incision site opening up some this AM.   Review of Systems:  No fever or chills Incision opening up some  All other review of systems: otherwise negative   Vital Signs:  BP 108/69   Pulse 88   Temp 98 F (36.7 C) (Other (Comment))   Resp 14   Ht 5\' 7"  (1.702 m)   Wt 195 lb (88.5 kg)   SpO2 96%   BMI 30.54 kg/m    Physical Exam:  Physical Exam Vitals reviewed.  Cardiovascular:     Rate and Rhythm: Normal rate.  Pulmonary:     Effort: Pulmonary effort is normal.  Abdominal:     General: There is no distension.     Palpations: Abdomen is soft.     Tenderness: There is no abdominal tenderness.     Comments: Lateral port site open slightly, edges minor erythema, no cellulitis, open about <0.5cm deep, no drainage, probed with qtip      Assessment:  50 y.o. yo Female with a little wound dehiscence but no signs of infection.  Plan:  Neosporin to wound daily. Keep covered.  Liver test repeat by Dr. 44.   PRN Follow up   Margo Aye, MD [facility] [address] [place], [place] [phone] (office)

## 2020-05-12 NOTE — Patient Instructions (Signed)
Neosporin to wound daily. Keep covered.  Liver test repeat by Dr. Margo Aye.

## 2020-05-18 ENCOUNTER — Encounter (HOSPITAL_COMMUNITY): Payer: Self-pay | Admitting: Psychiatry

## 2020-05-18 ENCOUNTER — Telehealth (INDEPENDENT_AMBULATORY_CARE_PROVIDER_SITE_OTHER): Payer: BC Managed Care – PPO | Admitting: Psychiatry

## 2020-05-18 ENCOUNTER — Other Ambulatory Visit: Payer: Self-pay

## 2020-05-18 DIAGNOSIS — F320 Major depressive disorder, single episode, mild: Secondary | ICD-10-CM

## 2020-05-18 DIAGNOSIS — F900 Attention-deficit hyperactivity disorder, predominantly inattentive type: Secondary | ICD-10-CM

## 2020-05-18 MED ORDER — LISDEXAMFETAMINE DIMESYLATE 50 MG PO CAPS: 50.0000 mg | ORAL_CAPSULE | Freq: Every day | ORAL | 0 refills | Status: DC

## 2020-05-18 MED ORDER — BUPROPION HCL ER (SR) 150 MG PO TB12: 150.0000 mg | ORAL_TABLET | Freq: Two times a day (BID) | ORAL | 5 refills | Status: DC

## 2020-05-18 MED ORDER — DIAZEPAM 5 MG PO TABS: 5.0000 mg | ORAL_TABLET | Freq: Four times a day (QID) | ORAL | 3 refills | Status: DC | PRN

## 2020-05-18 MED ORDER — FLUOXETINE HCL 20 MG PO CAPS: ORAL_CAPSULE | ORAL | 0 refills | Status: DC

## 2020-05-18 MED ORDER — LISDEXAMFETAMINE DIMESYLATE 50 MG PO CAPS: 50.0000 mg | ORAL_CAPSULE | Freq: Every morning | ORAL | 0 refills | Status: DC

## 2020-05-18 NOTE — Progress Notes (Signed)
Virtual Visit via Telephone Note  I connected with Sheri Wood on 05/18/20 at 10:40 AM EDT by telephone and verified that I am speaking with the correct person using two identifiers.  Location: Patient: home Provider: home   I discussed the limitations, risks, security and privacy concerns of performing an evaluation and management service by telephone and the availability of in person appointments. I also discussed with the patient that there may be a patient responsible charge related to this service. The patient expressed understanding and agreed to proceed.     I discussed the assessment and treatment plan with the patient. The patient was provided an opportunity to ask questions and all were answered. The patient agreed with the plan and demonstrated an understanding of the instructions.   The patient was advised to call back or seek an in-person evaluation if the symptoms worsen or if the condition fails to improve as anticipated.  I provided 15 minutes of non-face-to-face time during this encounter.   Diannia Ruder, MD  [facility] MD/PA/NP OP Progress Note  05/18/2020 10:57 AM Sheri Wood  MRN:  [id number]  Chief Complaint:  Chief Complaint    Anxiety; Depression; Follow-up     HPI: This patient is a 50 year old married white female who lives with her husband in [place].  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 The patient returns for follow-up after 3 months for depression ADD and binge eating disorder.  She states that overall her mood has been good.  Occasionally she gets a little bit down but not much.  She has been eating a little bit too much again and would like to go up a bit on the Vyvanse.  I think this is reasonable.  She denies symptoms of severe depression obsessional behaviors or anxiety.  She is sleeping well.  She had her gallbladder removed last month and is feeling much better in terms of nausea.  Visit Diagnosis:     ICD-10-CM   1. Mild single current episode of major depressive disorder (HCC)  F32.0   2. Attention deficit hyperactivity disorder (ADHD), predominantly inattentive type  F90.0     Past Psychiatric History: Long-term outpatient treatment for depression and anxiety  Past Medical History:  Past Medical History:  Diagnosis Date  . Allergy    seasonal  . Anxiety   . Depression   . Fibromyalgia   . Gastritis    history  . GERD (gastroesophageal reflux disease)    otc med prn  . IBS (irritable bowel syndrome)   . Migraine   . SVD (spontaneous vaginal delivery)    x 2  . Urticaria, chronic   . Vertigo     Past Surgical History:  Procedure Laterality Date  . CHOLECYSTECTOMY N/A 04/08/2020   Procedure: LAPAROSCOPIC CHOLECYSTECTOMY;  Surgeon: [name], MD;  Location: [facility];  Service: General;  Laterality: N/A;  . COLONOSCOPY  2008   negative  . DIAGNOSTIC LAPAROSCOPY     fibroids  . DILATION AND CURETTAGE OF UTERUS    . DILITATION & CURRETTAGE/HYSTROSCOPY WITH NOVASURE ABLATION N/A 05/06/2013   Procedure: DILATATION & CURETTAGE/HYSTEROSCOPY WITH NOVASURE ABLATION;  Surgeon: Alphonsus Sias. Ernestina Penna, MD;  Location: [facility];  Service: Gynecology;  Laterality: N/A;  . EYE SURGERY     bilateral lasik  . FOOT SURGERY  2017  . LAPAROSCOPIC BILATERAL SALPINGECTOMY Bilateral 05/06/2013   Procedure: LAPAROSCOPIC BILATERAL SALPINGECTOMY;  Surgeon: Tresa Endo A. Ernestina Penna, MD;  Location: [facility];  Service:  Gynecology;  Laterality: Bilateral;  . WISDOM TOOTH EXTRACTION      Family Psychiatric History: see below  Family History:  Family History  Problem Relation Age of Onset  . Alcohol abuse Brother   . Drug abuse Brother   . Depression Mother   . Anxiety disorder Mother   . Anxiety disorder Brother   . Depression Brother   . Bipolar disorder Maternal Uncle   . Anxiety disorder Paternal Grandfather   . Depression Paternal Grandfather   . Bipolar disorder Cousin     Social History:   Social History   Socioeconomic History  . Marital status: Married    Spouse name: Not on file  . Number of children: Not on file  . Years of education: Not on file  . Highest education level: Not on file  Occupational History  . Not on file  Tobacco Use  . Smoking status: Former Smoker    Packs/day: 0.75    Years: 10.00    Pack years: 7.50    Types: Cigarettes    Quit date: 02/08/1988    Years since quitting: 32.2  . Smokeless tobacco: Never Used  Substance and Sexual Activity  . Alcohol use: No  . Drug use: No  . Sexual activity: Yes    Birth control/protection: None  Other Topics Concern  . Not on file  Social History Narrative  . Not on file   Social Determinants of Health   Financial Resource Strain: Not on file  Food Insecurity: Not on file  Transportation Needs: Not on file  Physical Activity: Not on file  Stress: Not on file  Social Connections: Not on file    Allergies:  Allergies  Allergen Reactions  . Ciprofloxacin Other (See Comments)    Severe muscle and joint pain  . Cefzil [Cefprozil]     possible hives  . Augmentin [Amoxicillin-Pot Clavulanate] Nausea And Vomiting    Throat felt funny and gave pt anxiety  . Bee Venom Swelling    Metabolic Disorder Labs: No results found for: HGBA1C, MPG No results found for: PROLACTIN Lab Results  Component Value Date   CHOL 192 04/02/2015   TRIG 69 04/02/2015   HDL 60 04/02/2015   CHOLHDL 3.2 04/02/2015   VLDL 14 04/02/2015   LDLCALC 118 (H) 04/02/2015   Lab Results  Component Value Date   TSH 0.922 01/14/2020   TSH 1.940 03/23/2015    Therapeutic Level Labs: No results found for: LITHIUM No results found for: VALPROATE No components found for:  CBMZ  Current Medications: Current Outpatient Medications  Medication Sig Dispense Refill  . lisdexamfetamine (VYVANSE) 50 MG capsule Take 1 capsule (50 mg total) by mouth in the morning. 30 capsule 0  . lisdexamfetamine (VYVANSE) 50 MG capsule Take  1 capsule (50 mg total) by mouth daily. 30 capsule 0  . lisdexamfetamine (VYVANSE) 50 MG capsule Take 1 capsule (50 mg total) by mouth daily. 30 capsule 0  . buPROPion (WELLBUTRIN SR) 150 MG 12 hr tablet Take 1 tablet (150 mg total) by mouth 2 (two) times daily. 60 tablet 5  . diazepam (VALIUM) 5 MG tablet Take 1 tablet (5 mg total) by mouth every 6 (six) hours as needed. 30 tablet 3  . eletriptan (RELPAX) 40 MG tablet May repeat in 2 hours if headache persists or recurs. (Patient taking differently: Take 40 mg by mouth every 2 (two) hours as needed for migraine (May repeat in 2 hours if headache persists or recurs.).) 8 tablet  0  . FLUoxetine (PROZAC) 20 MG capsule TAKE (1) CAPSULE BY MOUTH ONCE DAILY. 30 capsule 0  . ondansetron (ZOFRAN) 4 MG tablet Take 1 tablet (4 mg total) by mouth every 8 (eight) hours as needed for nausea or vomiting. 20 tablet 0  . pramipexole (MIRAPEX) 1.5 MG tablet Take 1 tablet (1.5 mg total) by mouth at bedtime. 30 tablet 5  . SPRINTEC 28 0.25-35 MG-MCG tablet Take 1 tablet by mouth at bedtime.     No current facility-administered medications for this visit.     Musculoskeletal: Strength & Muscle Tone: within normal limits Gait & Station: normal Patient leans: N/A  Psychiatric Specialty Exam: Review of Systems  All other systems reviewed and are negative.   There were no vitals taken for this visit.There is no height or weight on file to calculate BMI.  General Appearance: NA  Eye Contact:  NA  Speech:  Clear and Coherent  Volume:  Normal  Mood:  Euthymic  Affect:  NA  Thought Process:  Goal Directed  Orientation:  Full (Time, Place, and Person)  Thought Content: WDL   Suicidal Thoughts:  No  Homicidal Thoughts:  No  Memory:  Immediate;   Good Recent;   Good Remote;   Fair  Judgement:  Good  Insight:  Good  Psychomotor Activity:  Normal  Concentration:  Concentration: Good and Attention Span: Good  Recall:  Good  Fund of Knowledge: Good   Language: Good  Akathisia:  No  Handed:  Right  AIMS (if indicated): not done  Assets:  Communication Skills Desire for Improvement Resilience Social Support Talents/Skills  ADL's:  Intact  Cognition: WNL  Sleep:  Good   Screenings: GAD-7   Flowsheet Row Telemedicine from 06/07/2019 in [facility] Office Visit from 12/28/2017 in [facility]  Total GAD-7 Score 14 12    PHQ2-9   Flowsheet Row Video [facility] Office Visit from 02/04/2020 in [facility] Telemedicine from 06/07/2019 in [facility] Office Visit from 12/28/2017 in [facility] Office Visit from 02/03/2017 in [facility]  PHQ-2 Total Score 0 1 2 3 3   PHQ-9 Total Score -- 7 11 9 7     Flowsheet Row Video Visit from 05/18/2020 [facility] Admission (Discharged) from 04/08/2020 in [facility] Pre-Admission Testing 60 from 04/07/2020 in [facility]  C-SSRS RISK CATEGORY No Risk No Risk No Risk       Assessment and Plan: This patient is a 50 year old female with a history of fibromyalgia depression anxiety binge eating disorder.  She seems to be doing much better in terms of mood and obsessional symptoms so she will continue Prozac 20 mg daily in addition to Wellbutrin SR 150 mg twice daily for depression.  She will continue Vyvanse but increase the dosage to 50 mg daily for ADD and binge eating disorder.  She still uses the Valium 5 mg daily as needed for anxiety but uses it very sparingly.  She will return to see me in 3 months   [place], MD 05/18/2020, 10:57 AM

## 2020-05-19 ENCOUNTER — Telehealth (HOSPITAL_COMMUNITY): Payer: Self-pay | Admitting: *Deleted

## 2020-05-19 NOTE — Telephone Encounter (Signed)
Staff received PA from [employer] for patient Vyvanse 50 mg on 05-18-2020. When staff attempted to do PA, the insurance section to verify eligibility of insurance stated they could not find patient. Staff called patient to see if she updated pharmacy with the correct insurance and patient informed staff that her insurance changed and she have not contacted pharmacy yet. Per pt she will reach out to her pharmacy and update insurance with them

## 2020-05-21 ENCOUNTER — Encounter (HOSPITAL_COMMUNITY): Payer: Self-pay

## 2020-05-28 ENCOUNTER — Other Ambulatory Visit (HOSPITAL_COMMUNITY): Payer: Self-pay | Admitting: Internal Medicine

## 2020-05-28 DIAGNOSIS — Z1231 Encounter for screening mammogram for malignant neoplasm of breast: Secondary | ICD-10-CM

## 2020-06-03 ENCOUNTER — Ambulatory Visit (HOSPITAL_COMMUNITY)
Admission: RE | Admit: 2020-06-03 | Discharge: 2020-06-03 | Disposition: A | Discharge status: Home / Self Care | Payer: BC Managed Care – PPO | Source: Ambulatory Visit | Attending: Internal Medicine | Admitting: Internal Medicine

## 2020-06-03 ENCOUNTER — Encounter (HOSPITAL_COMMUNITY): Payer: Self-pay

## 2020-06-03 DIAGNOSIS — Z1231 Encounter for screening mammogram for malignant neoplasm of breast: Secondary | ICD-10-CM

## 2020-06-08 ENCOUNTER — Other Ambulatory Visit (HOSPITAL_COMMUNITY): Payer: Self-pay | Admitting: Internal Medicine

## 2020-06-08 DIAGNOSIS — R928 Other abnormal and inconclusive findings on diagnostic imaging of breast: Secondary | ICD-10-CM

## 2020-06-11 ENCOUNTER — Ambulatory Visit (HOSPITAL_COMMUNITY)
Admission: RE | Admit: 2020-06-11 | Discharge: 2020-06-11 | Disposition: A | Discharge status: Home / Self Care | Payer: BC Managed Care – PPO | Source: Ambulatory Visit | Attending: Internal Medicine | Admitting: Internal Medicine

## 2020-06-11 ENCOUNTER — Other Ambulatory Visit (HOSPITAL_COMMUNITY): Payer: Self-pay | Admitting: Internal Medicine

## 2020-06-11 ENCOUNTER — Other Ambulatory Visit: Payer: Self-pay

## 2020-06-11 DIAGNOSIS — R928 Other abnormal and inconclusive findings on diagnostic imaging of breast: Secondary | ICD-10-CM | POA: Diagnosis present

## 2020-06-15 ENCOUNTER — Other Ambulatory Visit (HOSPITAL_COMMUNITY): Payer: Self-pay | Admitting: Internal Medicine

## 2020-06-15 ENCOUNTER — Inpatient Hospital Stay
Admission: RE | Admit: 2020-06-15 | Discharge: 2020-06-15 | Disposition: A | Discharge status: Home / Self Care | Payer: Self-pay | Source: Ambulatory Visit | Attending: Internal Medicine | Admitting: Internal Medicine

## 2020-06-15 ENCOUNTER — Ambulatory Visit
Admission: RE | Admit: 2020-06-15 | Discharge: 2020-06-15 | Disposition: A | Discharge status: Home / Self Care | Payer: Self-pay | Source: Ambulatory Visit | Attending: Internal Medicine | Admitting: Internal Medicine

## 2020-06-15 DIAGNOSIS — R928 Other abnormal and inconclusive findings on diagnostic imaging of breast: Secondary | ICD-10-CM

## 2020-06-23 ENCOUNTER — Ambulatory Visit (HOSPITAL_COMMUNITY): Admission: RE | Admit: 2020-06-23 | Payer: BC Managed Care – PPO | Source: Ambulatory Visit

## 2020-06-23 NOTE — Telephone Encounter (Signed)
LMOM

## 2020-07-21 ENCOUNTER — Telehealth (HOSPITAL_COMMUNITY): Payer: Self-pay | Admitting: *Deleted

## 2020-07-21 NOTE — Telephone Encounter (Signed)
Patient is needing refills for all of her medications provider prescribes for her. Patient appt was cancelled due to provider being on medical leave. Message was left to call office to resch appt.  

## 2020-07-23 ENCOUNTER — Other Ambulatory Visit (HOSPITAL_COMMUNITY): Payer: Self-pay | Admitting: Psychiatry

## 2020-07-23 MED ORDER — LISDEXAMFETAMINE DIMESYLATE 50 MG PO CAPS: 50.0000 mg | ORAL_CAPSULE | Freq: Every day | ORAL | 0 refills | Status: DC

## 2020-07-23 MED ORDER — FLUOXETINE HCL 20 MG PO CAPS: ORAL_CAPSULE | ORAL | 0 refills | Status: DC

## 2020-07-23 MED ORDER — LISDEXAMFETAMINE DIMESYLATE 50 MG PO CAPS: 50.0000 mg | ORAL_CAPSULE | Freq: Every morning | ORAL | 0 refills | Status: DC

## 2020-07-23 MED ORDER — BUPROPION HCL ER (SR) 150 MG PO TB12: 150.0000 mg | ORAL_TABLET | Freq: Two times a day (BID) | ORAL | 5 refills | Status: DC

## 2020-07-23 MED ORDER — DIAZEPAM 5 MG PO TABS: 5.0000 mg | ORAL_TABLET | Freq: Four times a day (QID) | ORAL | 3 refills | Status: DC | PRN

## 2020-07-23 NOTE — Telephone Encounter (Signed)
sent 

## 2020-08-17 ENCOUNTER — Telehealth (HOSPITAL_COMMUNITY): Payer: BC Managed Care – PPO | Admitting: Psychiatry

## 2020-08-29 ENCOUNTER — Telehealth: Payer: BC Managed Care – PPO | Admitting: Nurse Practitioner

## 2020-08-29 DIAGNOSIS — J0101 Acute recurrent maxillary sinusitis: Secondary | ICD-10-CM | POA: Diagnosis not present

## 2020-08-29 MED ORDER — DOXYCYCLINE HYCLATE 100 MG PO TABS: 100.0000 mg | ORAL_TABLET | Freq: Two times a day (BID) | ORAL | 0 refills | Status: DC

## 2020-08-29 NOTE — Progress Notes (Signed)

## 2020-09-10 ENCOUNTER — Telehealth (INDEPENDENT_AMBULATORY_CARE_PROVIDER_SITE_OTHER): Payer: BC Managed Care – PPO | Admitting: Psychiatry

## 2020-09-10 ENCOUNTER — Encounter (HOSPITAL_COMMUNITY): Payer: Self-pay | Admitting: Psychiatry

## 2020-09-10 ENCOUNTER — Other Ambulatory Visit: Payer: Self-pay

## 2020-09-10 DIAGNOSIS — F900 Attention-deficit hyperactivity disorder, predominantly inattentive type: Secondary | ICD-10-CM | POA: Diagnosis not present

## 2020-09-10 DIAGNOSIS — F320 Major depressive disorder, single episode, mild: Secondary | ICD-10-CM | POA: Diagnosis not present

## 2020-09-10 MED ORDER — BUPROPION HCL ER (SR) 150 MG PO TB12: 150.0000 mg | ORAL_TABLET | Freq: Two times a day (BID) | ORAL | 5 refills | Status: DC

## 2020-09-10 MED ORDER — DIAZEPAM 5 MG PO TABS: 5.0000 mg | ORAL_TABLET | Freq: Four times a day (QID) | ORAL | 3 refills | Status: DC | PRN

## 2020-09-10 MED ORDER — LISDEXAMFETAMINE DIMESYLATE 50 MG PO CAPS: 50.0000 mg | ORAL_CAPSULE | Freq: Every morning | ORAL | 0 refills | Status: DC

## 2020-09-10 MED ORDER — LISDEXAMFETAMINE DIMESYLATE 50 MG PO CAPS: 50.0000 mg | ORAL_CAPSULE | Freq: Every day | ORAL | 0 refills | Status: DC

## 2020-09-10 MED ORDER — FLUOXETINE HCL 20 MG PO CAPS: ORAL_CAPSULE | ORAL | 0 refills | Status: DC

## 2020-09-10 NOTE — Progress Notes (Signed)
Virtual Visit via Telephone Note  I connected with Sheri Wood on 09/10/20 at  3:20 PM EDT by telephone and verified that I am speaking with the correct person using two identifiers.  Location: Patient: home Provider: office   I discussed the limitations, risks, security and privacy concerns of performing an evaluation and management service by telephone and the availability of in person appointments. I also discussed with the patient that there may be a patient responsible charge related to this service. The patient expressed understanding and agreed to proceed.     I discussed the assessment and treatment plan with the patient. The patient was provided an opportunity to ask questions and all were answered. The patient agreed with the plan and demonstrated an understanding of the instructions.   The patient was advised to call back or seek an in-person evaluation if the symptoms worsen or if the condition fails to improve as anticipated.  I provided 15 minutes of non-face-to-face time during this encounter.   Diannia Ruder, MD  [facility] MD/PA/NP OP Progress Note  09/10/2020 3:25 PM Sheri Wood  MRN:  [id number]  Chief Complaint:  Chief Complaint   Depression; ADD; Follow-up    HPI: This patient is a 50 year old married white female who lives with her husband in [place].  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  She states overall she is doing fairly well.  She is about to come off her hormone therapy because she thinks she is now in menopause.  She is having hot flashes and some trouble sleeping.  She has not been using the Valium and I urged her to use it at night so that she can sleep.  She does think the Vyvanse continues to help with her focus and binge eating symptoms.  She denies being seriously depressed or suicidal and her anxiety is under good control. Visit Diagnosis:    ICD-10-CM   1.  Mild single current episode of major depressive disorder (HCC)  F32.0     2. Attention deficit hyperactivity disorder (ADHD), predominantly inattentive type  F90.0       Past Psychiatric History: Long-term outpatient treatment for depression and anxiety  Past Medical History:  Past Medical History:  Diagnosis Date   Allergy    seasonal   Anxiety    Depression    Fibromyalgia    Gastritis    history   GERD (gastroesophageal reflux disease)    otc med prn   IBS (irritable bowel syndrome)    Migraine    SVD (spontaneous vaginal delivery)    x 2   Urticaria, chronic    Vertigo     Past Surgical History:  Procedure Laterality Date   CHOLECYSTECTOMY N/A 04/08/2020   Procedure: LAPAROSCOPIC CHOLECYSTECTOMY;  Surgeon: [name], MD;  Location: [facility];  Service: General;  Laterality: N/A;   COLONOSCOPY  2008   negative   DIAGNOSTIC LAPAROSCOPY     fibroids   DILATION AND CURETTAGE OF UTERUS     DILITATION & CURRETTAGE/HYSTROSCOPY WITH NOVASURE ABLATION N/A 05/06/2013   Procedure: DILATATION & CURETTAGE/HYSTEROSCOPY WITH NOVASURE ABLATION;  Surgeon: Alphonsus Sias. Ernestina Penna, MD;  Location: [facility];  Service: Gynecology;  Laterality: N/A;   EYE SURGERY     bilateral lasik   FOOT SURGERY  2017   LAPAROSCOPIC BILATERAL SALPINGECTOMY Bilateral 05/06/2013   Procedure: LAPAROSCOPIC BILATERAL SALPINGECTOMY;  Surgeon: Tresa Endo A. Ernestina Penna, MD;  Location:  [facility];  Service: Gynecology;  Laterality: Bilateral;   WISDOM TOOTH EXTRACTION      Family Psychiatric History: see below  Family History:  Family History  Problem Relation Age of Onset   Alcohol abuse Brother    Drug abuse Brother    Depression Mother    Anxiety disorder Mother    Anxiety disorder Brother    Depression Brother    Bipolar disorder Maternal Uncle    Anxiety disorder Paternal Grandfather    Depression Paternal Grandfather    Bipolar disorder Cousin    Breast cancer Maternal Aunt     Social History:  Social  History   Socioeconomic History   Marital status: Married    Spouse name: Not on file   Number of children: Not on file   Years of education: Not on file   Highest education level: Not on file  Occupational History   Not on file  Tobacco Use   Smoking status: Former    Packs/day: 0.75    Years: 10.00    Pack years: 7.50    Types: Cigarettes    Quit date: 02/08/1988    Years since quitting: 32.6   Smokeless tobacco: Never  Substance and Sexual Activity   Alcohol use: No   Drug use: No   Sexual activity: Yes    Birth control/protection: None  Other Topics Concern   Not on file  Social History Narrative   Not on file   Social Determinants of Health   Financial Resource Strain: Not on file  Food Insecurity: Not on file  Transportation Needs: Not on file  Physical Activity: Not on file  Stress: Not on file  Social Connections: Not on file    Allergies:  Allergies  Allergen Reactions   Ciprofloxacin Other (See Comments)    Severe muscle and joint pain   Cefzil [Cefprozil]     possible hives   Augmentin [Amoxicillin-Pot Clavulanate] Nausea And Vomiting    Throat felt funny and gave pt anxiety   Bee Venom Swelling    Metabolic Disorder Labs: No results found for: HGBA1C, MPG No results found for: PROLACTIN Lab Results  Component Value Date   CHOL 192 04/02/2015   TRIG 69 04/02/2015   HDL 60 04/02/2015   CHOLHDL 3.2 04/02/2015   VLDL 14 04/02/2015   LDLCALC 118 (H) 04/02/2015   Lab Results  Component Value Date   TSH 0.922 01/14/2020   TSH 1.940 03/23/2015    Therapeutic Level Labs: No results found for: LITHIUM No results found for: VALPROATE No components found for:  CBMZ  Current Medications: Current Outpatient Medications  Medication Sig Dispense Refill   buPROPion (WELLBUTRIN SR) 150 MG 12 hr tablet Take 1 tablet (150 mg total) by mouth 2 (two) times daily. 60 tablet 5   diazepam (VALIUM) 5 MG tablet Take 1 tablet (5 mg total) by mouth every 6  (six) hours as needed. 30 tablet 3   doxycycline (VIBRA-TABS) 100 MG tablet Take 1 tablet (100 mg total) by mouth 2 (two) times daily. 1 po bid 20 tablet 0   eletriptan (RELPAX) 40 MG tablet May repeat in 2 hours if headache persists or recurs. (Patient taking differently: Take 40 mg by mouth every 2 (two) hours as needed for migraine (May repeat in 2 hours if headache persists or recurs.).) 8 tablet 0   FLUoxetine (PROZAC) 20 MG capsule TAKE (1) CAPSULE BY MOUTH ONCE DAILY. 30 capsule 0   lisdexamfetamine (VYVANSE) 50 MG capsule  Take 1 capsule (50 mg total) by mouth daily. 30 capsule 0   lisdexamfetamine (VYVANSE) 50 MG capsule Take 1 capsule (50 mg total) by mouth in the morning. 30 capsule 0   lisdexamfetamine (VYVANSE) 50 MG capsule Take 1 capsule (50 mg total) by mouth daily. 30 capsule 0   ondansetron (ZOFRAN) 4 MG tablet Take 1 tablet (4 mg total) by mouth every 8 (eight) hours as needed for nausea or vomiting. 20 tablet 0   pramipexole (MIRAPEX) 1.5 MG tablet Take 1 tablet (1.5 mg total) by mouth at bedtime. 30 tablet 5   SPRINTEC 28 0.25-35 MG-MCG tablet Take 1 tablet by mouth at bedtime.     No current facility-administered medications for this visit.     Musculoskeletal: Strength & Muscle Tone: within normal limits Gait & Station: normal Patient leans: N/A  Psychiatric Specialty Exam: Review of Systems  Psychiatric/Behavioral:  Positive for sleep disturbance.   All other systems reviewed and are negative.  There were no vitals taken for this visit.There is no height or weight on file to calculate BMI.  General Appearance: NA  Eye Contact:  NA  Speech:  Clear and Coherent  Volume:  Normal  Mood:  Euthymic  Affect:  NA  Thought Process:  Goal Directed  Orientation:  Full (Time, Place, and Person)  Thought Content: Rumination   Suicidal Thoughts:  No  Homicidal Thoughts:  No  Memory:  Immediate;   Good Recent;   Good Remote;   Good  Judgement:  Good  Insight:  Good   Psychomotor Activity:  Normal  Concentration:  Concentration: Good and Attention Span: Good  Recall:  Good  Fund of Knowledge: Good  Language: Good  Akathisia:  No  Handed:  Right  AIMS (if indicated): not done  Assets:  Communication Skills Desire for Improvement Physical Health Resilience Social Support Talents/Skills  ADL's:  Intact  Cognition: WNL  Sleep:  Fair   Screenings: GAD-7    Flowsheet Row Telemedicine from 06/07/2019 in [facility] Office Visit from 12/28/2017 in [facility]  Total GAD-7 Score 14 12      PHQ2-9    Flowsheet Row Video Visit from 09/10/2020 [facility] Video Visit from 05/18/2020 in [facility] Office Visit from 02/04/2020 in [facility] Telemedicine from 06/07/2019 in [facility] Office Visit from 12/28/2017 in [facility]  PHQ-2 Total Score 0 0 1 2 3   PHQ-9 Total Score -- -- 7 11 9       Flowsheet Row Video Visit from 09/10/2020 in [facility] Video Visit from 05/18/2020 in [facility] Admission (Discharged) from 04/08/2020 in [facility]  C-SSRS RISK CATEGORY No Risk No Risk No Risk        Assessment and Plan: This patient is a 50 year old female with a history of fibromyalgia depression anxiety and binge eating disorder.  Her mood seems to be stable as well as the anxiety so she will continue Prozac 20 mg daily as well as Wellbutrin SR 150 mg twice daily for depression, Vyvanse 50 mg daily for ADD and binge eating disorder.  I urged her to use the Valium 5 mg daily as needed for anxiety and sleep.  She will return to see me in 3 months   06/08/2020, MD 09/10/2020, 3:25 PM

## 2020-09-29 ENCOUNTER — Other Ambulatory Visit (HOSPITAL_COMMUNITY): Payer: Self-pay | Admitting: Psychiatry

## 2020-09-30 ENCOUNTER — Telehealth (HOSPITAL_COMMUNITY): Payer: Self-pay | Admitting: *Deleted

## 2020-09-30 NOTE — Telephone Encounter (Signed)
Patient called and LMOM about one of her medication. Patient did not leave further details. Staff called pt [facility]

## 2020-10-13 ENCOUNTER — Telehealth: Payer: BC Managed Care – PPO | Admitting: Physician Assistant

## 2020-10-13 DIAGNOSIS — N390 Urinary tract infection, site not specified: Secondary | ICD-10-CM

## 2020-10-13 MED ORDER — SULFAMETHOXAZOLE-TRIMETHOPRIM 800-160 MG PO TABS: 1.0000 | ORAL_TABLET | Freq: Two times a day (BID) | ORAL | 0 refills | Status: AC

## 2020-10-13 NOTE — Progress Notes (Signed)
E-Visit for Urinary Problems  We are sorry that you are not feeling well.  Here is how we plan to help!  Based on what you shared with me it looks like you most likely have a simple urinary tract infection.  A UTI (Urinary Tract Infection) is a bacterial infection of the bladder.  Most cases of urinary tract infections are simple to treat but a key part of your care is to encourage you to drink plenty of fluids and watch your symptoms carefully.  I have prescribed Bactrim DS One tablet twice a day for 5 days.  Your symptoms should gradually improve. Call us if the burning in your urine worsens, you develop worsening fever, back pain or pelvic pain or if your symptoms do not resolve after completing the antibiotic.  Urinary tract infections can be prevented by drinking plenty of water to keep your body hydrated.  Also be sure when you wipe, wipe from front to back and don't hold it in!  If possible, empty your bladder every 4 hours.  HOME CARE Drink plenty of fluids Compete the full course of the antibiotics even if the symptoms resolve Remember, when you need to go.go. Holding in your urine can increase the likelihood of getting a UTI! GET HELP RIGHT AWAY IF: You cannot urinate You get a high fever Worsening back pain occurs You see blood in your urine You feel sick to your stomach or throw up You feel like you are going to pass out  MAKE SURE YOU  Understand these instructions. Will watch your condition. Will get help right away if you are not doing well or get worse.   Thank you for choosing an e-visit.  Your e-visit answers were reviewed by a board certified advanced clinical practitioner to complete your personal care plan. Depending upon the condition, your plan could have included both over the counter or prescription medications.  Please review your pharmacy choice. Make sure the pharmacy is open so you can pick up prescription now. If there is a problem, you may contact  your provider through [employer] and have the prescription routed to another pharmacy.  Your safety is important to [place]. If you have drug allergies check your prescription carefully.   For the next 24 hours you can use MyChart to ask questions about today's visit, request a non-urgent call back, or ask for a work or school excuse. You will get an email in the next two days asking about your experience. I hope that your e-visit has been valuable and will speed your recovery.   Greater than 5 minutes, yet less than 10 minutes of time have been spent researching, coordinating, and implementing care for this patient today

## 2020-11-05 ENCOUNTER — Telehealth: Payer: BC Managed Care – PPO | Admitting: Nurse Practitioner

## 2020-11-05 DIAGNOSIS — L219 Seborrheic dermatitis, unspecified: Secondary | ICD-10-CM

## 2020-11-05 NOTE — Progress Notes (Signed)
Based on what you shared with me, I feel your condition warrants further evaluation and I recommend that you be seen for a face to face visit with your PCP to evaluate you for any possible dermatitis.  Please contact your primary care physician practice to be seen. Many offices offer virtual options to be seen via video if you are not comfortable going in person to a medical facility at this time.  NOTE: You will NOT be charged for this eVisit.  If you do not have a PCP, [facility] offers a free physician referral service available at [phone]. Our trained staff has the experience, knowledge and resources to put you in touch with a physician who is right for you.    If you are having a true medical emergency please call 911.   Your e-visit answers were reviewed by a board certified advanced clinical practitioner to complete your personal care plan.  Thank you for using e-Visits.

## 2020-11-05 NOTE — Progress Notes (Signed)
I have spent 5 minutes in review of e-visit questionnaire, review and updating patient chart, medical decision making and response to patient.  ° °Andrian Urbach W Drena Ham, NP ° °  °

## 2020-11-18 ENCOUNTER — Other Ambulatory Visit (HOSPITAL_COMMUNITY): Payer: Self-pay | Admitting: Psychiatry

## 2020-12-22 ENCOUNTER — Other Ambulatory Visit (HOSPITAL_COMMUNITY): Payer: Self-pay | Admitting: Psychiatry

## 2020-12-22 ENCOUNTER — Telehealth (HOSPITAL_COMMUNITY): Payer: Self-pay | Admitting: *Deleted

## 2020-12-22 MED ORDER — FLUOXETINE HCL 20 MG PO CAPS: ORAL_CAPSULE | ORAL | 2 refills | Status: DC

## 2020-12-22 NOTE — Telephone Encounter (Signed)
sent 

## 2020-12-22 NOTE — Telephone Encounter (Signed)
Patient called requesting refills for her Prozac

## 2021-02-09 ENCOUNTER — Other Ambulatory Visit (HOSPITAL_COMMUNITY): Payer: Self-pay | Admitting: Psychiatry

## 2021-02-24 ENCOUNTER — Other Ambulatory Visit: Payer: Self-pay

## 2021-02-24 ENCOUNTER — Ambulatory Visit (HOSPITAL_COMMUNITY)
Admission: RE | Admit: 2021-02-24 | Discharge: 2021-02-24 | Disposition: A | Discharge status: Home / Self Care | Payer: BC Managed Care – PPO | Source: Ambulatory Visit | Attending: Nurse Practitioner | Admitting: Nurse Practitioner

## 2021-02-24 ENCOUNTER — Other Ambulatory Visit (HOSPITAL_COMMUNITY): Payer: Self-pay | Admitting: Nurse Practitioner

## 2021-02-24 DIAGNOSIS — R109 Unspecified abdominal pain: Secondary | ICD-10-CM | POA: Insufficient documentation

## 2021-03-04 ENCOUNTER — Other Ambulatory Visit (HOSPITAL_COMMUNITY): Payer: Self-pay | Admitting: Psychiatry

## 2021-03-04 NOTE — Telephone Encounter (Signed)
Call for appt

## 2021-03-09 ENCOUNTER — Telehealth (HOSPITAL_COMMUNITY): Payer: Self-pay | Admitting: Psychiatry

## 2021-03-09 NOTE — Telephone Encounter (Signed)
Called to schedule f/u appt left detailed voicemail requesting a call back

## 2021-03-13 ENCOUNTER — Other Ambulatory Visit (HOSPITAL_COMMUNITY): Payer: Self-pay | Admitting: Psychiatry

## 2021-03-15 NOTE — Telephone Encounter (Signed)
Call for appt

## 2021-03-18 ENCOUNTER — Encounter (HOSPITAL_COMMUNITY): Payer: Self-pay | Admitting: Psychiatry

## 2021-03-18 ENCOUNTER — Telehealth (INDEPENDENT_AMBULATORY_CARE_PROVIDER_SITE_OTHER): Payer: BC Managed Care – PPO | Admitting: Psychiatry

## 2021-03-18 ENCOUNTER — Other Ambulatory Visit: Payer: Self-pay

## 2021-03-18 DIAGNOSIS — F320 Major depressive disorder, single episode, mild: Secondary | ICD-10-CM

## 2021-03-18 DIAGNOSIS — F900 Attention-deficit hyperactivity disorder, predominantly inattentive type: Secondary | ICD-10-CM

## 2021-03-18 MED ORDER — BUPROPION HCL ER (SR) 150 MG PO TB12: 150.0000 mg | ORAL_TABLET | Freq: Two times a day (BID) | ORAL | 5 refills | Status: DC

## 2021-03-18 MED ORDER — LISDEXAMFETAMINE DIMESYLATE 50 MG PO CAPS: 50.0000 mg | ORAL_CAPSULE | Freq: Every day | ORAL | 0 refills | Status: DC

## 2021-03-18 MED ORDER — DULOXETINE HCL 60 MG PO CPEP: 60.0000 mg | ORAL_CAPSULE | Freq: Every day | ORAL | 2 refills | Status: DC

## 2021-03-18 NOTE — Progress Notes (Signed)
Virtual Visit via Telephone Note  I connected with Sheri Wood on 03/18/21 at  4:00 PM EST by telephone and verified that I am speaking with the correct person using two identifiers.  Location: Patient: home Provider: office   I discussed the limitations, risks, security and privacy concerns of performing an evaluation and management service by telephone and the availability of in person appointments. I also discussed with the patient that there may be a patient responsible charge related to this service. The patient expressed understanding and agreed to proceed.      I discussed the assessment and treatment plan with the patient. The patient was provided an opportunity to ask questions and all were answered. The patient agreed with the plan and demonstrated an understanding of the instructions.   The patient was advised to call back or seek an in-person evaluation if the symptoms worsen or if the condition fails to improve as anticipated.  I provided 15 minutes of non-face-to-face time during this encounter.   Diannia Ruder, MD  [facility] MD/PA/NP OP Progress Note  03/18/2021 4:21 PM Sheri Wood  MRN:  [id number]  Chief Complaint:  Chief Complaint   Depression; Anxiety; ADD; Follow-up    HPI: This patient is a 51 year old married white female who lives with her husband in [place].  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  She states that she has been feeling overwhelmed lately.  She is having stress at home because her house remodel is unfinished and everything is "a mess."  She is worried about her brother who is recently gone into alcohol treatment.  She is also having fibromyalgia symptoms as well as joint problems and has been diagnosed with fibromyalgia.  She is on Lyrica and diclofenac which helped to some degree.  She also thinks she is in menopause because she is having hot flashes.  She is still on a birth  control pill but is really not helping that much.  I urged her to talk to OB/GYN about this.  The patient states all these physical ailments are really taking a toll on her mental health.  I suggested that we switch her Prozac to Cymbalta because it may help more with the chronic pain and she is willing to give this a try.  The Vyvanse continues to help with her focus and binge eating.  She has been on Wellbutrin for quite some time and we may need to change this but I do not want to do too many things in 1 day. Visit Diagnosis:    ICD-10-CM   1. Mild single current episode of major depressive disorder (HCC)  F32.0     2. Attention deficit hyperactivity disorder (ADHD), predominantly inattentive type  F90.0       Past Psychiatric History: Long-term outpatient treatment for depression and anxiety  Past Medical History:  Past Medical History:  Diagnosis Date   Allergy    seasonal   Anxiety    Depression    Fibromyalgia    Gastritis    history   GERD (gastroesophageal reflux disease)    otc med prn   IBS (irritable bowel syndrome)    Migraine    SVD (spontaneous vaginal delivery)    x 2   Urticaria, chronic    Vertigo     Past Surgical History:  Procedure Laterality Date   CHOLECYSTECTOMY N/A 04/08/2020   Procedure: LAPAROSCOPIC CHOLECYSTECTOMY;  Surgeon: [name],  MD;  Location: [facility];  Service: General;  Laterality: N/A;   COLONOSCOPY  2008   negative   DIAGNOSTIC LAPAROSCOPY     fibroids   DILATION AND CURETTAGE OF UTERUS     DILITATION & CURRETTAGE/HYSTROSCOPY WITH NOVASURE ABLATION N/A 05/06/2013   Procedure: DILATATION & CURETTAGE/HYSTEROSCOPY WITH NOVASURE ABLATION;  Surgeon: Alphonsus Sias. Ernestina Penna, MD;  Location: [facility];  Service: Gynecology;  Laterality: N/A;   EYE SURGERY     bilateral lasik   FOOT SURGERY  2017   LAPAROSCOPIC BILATERAL SALPINGECTOMY Bilateral 05/06/2013   Procedure: LAPAROSCOPIC BILATERAL SALPINGECTOMY;  Surgeon: Tresa Endo A. Ernestina Penna, MD;   Location: [facility];  Service: Gynecology;  Laterality: Bilateral;   WISDOM TOOTH EXTRACTION      Family Psychiatric History: see below  Family History:  Family History  Problem Relation Age of Onset   Alcohol abuse Brother    Drug abuse Brother    Depression Mother    Anxiety disorder Mother    Anxiety disorder Brother    Depression Brother    Bipolar disorder Maternal Uncle    Anxiety disorder Paternal Grandfather    Depression Paternal Grandfather    Bipolar disorder Cousin    Breast cancer Maternal Aunt     Social History:  Social History   Socioeconomic History   Marital status: Married    Spouse name: Not on file   Number of children: Not on file   Years of education: Not on file   Highest education level: Not on file  Occupational History   Not on file  Tobacco Use   Smoking status: Former    Packs/day: 0.75    Years: 10.00    Pack years: 7.50    Types: Cigarettes    Quit date: 02/08/1988    Years since quitting: 33.1   Smokeless tobacco: Never  Substance and Sexual Activity   Alcohol use: No   Drug use: No   Sexual activity: Yes    Birth control/protection: None  Other Topics Concern   Not on file  Social History Narrative   Not on file   Social Determinants of Health   Financial Resource Strain: Not on file  Food Insecurity: Not on file  Transportation Needs: Not on file  Physical Activity: Not on file  Stress: Not on file  Social Connections: Not on file    Allergies:  Allergies  Allergen Reactions   Ciprofloxacin Other (See Comments)    Severe muscle and joint pain   Cefzil [Cefprozil]     possible hives   Augmentin [Amoxicillin-Pot Clavulanate] Nausea And Vomiting    Throat felt funny and gave pt anxiety   Bee Venom Swelling    Metabolic Disorder Labs: No results found for: HGBA1C, MPG No results found for: PROLACTIN Lab Results  Component Value Date   CHOL 192 04/02/2015   TRIG 69 04/02/2015   HDL 60 04/02/2015   CHOLHDL 3.2  04/02/2015   VLDL 14 04/02/2015   LDLCALC 118 (H) 04/02/2015   Lab Results  Component Value Date   TSH 0.922 01/14/2020   TSH 1.940 03/23/2015    Therapeutic Level Labs: No results found for: LITHIUM No results found for: VALPROATE No components found for:  CBMZ  Current Medications: Current Outpatient Medications  Medication Sig Dispense Refill   DULoxetine (CYMBALTA) 60 MG capsule Take 1 capsule (60 mg total) by mouth daily. 30 capsule 2   buPROPion (WELLBUTRIN SR) 150 MG 12 hr tablet Take 1 tablet (150 mg total)  by mouth 2 (two) times daily. 60 tablet 5   diazepam (VALIUM) 5 MG tablet TAKE 1 TABLET BY MOUTH ONCE EVERY 6 HOURS AS NEEDED. 30 tablet 0   diclofenac (VOLTAREN) 75 MG EC tablet Take by mouth 2 (two) times daily.     eletriptan (RELPAX) 40 MG tablet May repeat in 2 hours if headache persists or recurs. (Patient taking differently: Take 40 mg by mouth every 2 (two) hours as needed for migraine (May repeat in 2 hours if headache persists or recurs.).) 8 tablet 0   lisdexamfetamine (VYVANSE) 50 MG capsule Take 1 capsule (50 mg total) by mouth in the morning. 30 capsule 0   lisdexamfetamine (VYVANSE) 50 MG capsule Take 1 capsule (50 mg total) by mouth daily. 30 capsule 0   ondansetron (ZOFRAN) 4 MG tablet Take 1 tablet (4 mg total) by mouth every 8 (eight) hours as needed for nausea or vomiting. 20 tablet 0   pramipexole (MIRAPEX) 1.5 MG tablet Take 1 tablet (1.5 mg total) by mouth at bedtime. 30 tablet 5   pregabalin (LYRICA) 25 MG capsule Take 25 mg by mouth 2 (two) times daily.     SPRINTEC 28 0.25-35 MG-MCG tablet Take 1 tablet by mouth at bedtime.     VYVANSE 50 MG capsule TAKE (1) CAPSULE BY MOUTH EACH MORNING. 30 capsule 0   No current facility-administered medications for this visit.     Musculoskeletal: Strength & Muscle Tone: na Gait & Station: na Patient leans: N/A  Psychiatric Specialty Exam: Review of Systems  Musculoskeletal:  Positive for arthralgias,  joint swelling and myalgias.  Psychiatric/Behavioral:  Positive for dysphoric mood and sleep disturbance.   All other systems reviewed and are negative.  There were no vitals taken for this visit.There is no height or weight on file to calculate BMI.  General Appearance: NA  Eye Contact:  NA  Speech:  Clear and Coherent  Volume:  Normal  Mood:  Dysphoric  Affect:  NA  Thought Process:  Goal Directed  Orientation:  Full (Time, Place, and Person)  Thought Content: Rumination   Suicidal Thoughts:  No  Homicidal Thoughts:  No  Memory:  Immediate;   Good Recent;   Good Remote;   Good  Judgement:  Good  Insight:  Good  Psychomotor Activity:  Decreased  Concentration:  Concentration: Good and Attention Span: Good  Recall:  Good  Fund of Knowledge: Good  Language: Good  Akathisia:  No  Handed:  Right  AIMS (if indicated): not done  Assets:  Communication Skills Desire for Improvement Resilience Social Support Talents/Skills  ADL's:  Intact  Cognition: WNL  Sleep:  Fair   Screenings: GAD-7    Flowsheet Row [facility]med[facility] 12/28/2017 in [facility]  Total GAD-7 Score 14 12      PHQ2-9    Flowsheet Row Video [facility]18[facility]2021 [facility] Video Visit from 09/10/2020 in [facility] Video Visit from 05/18/2020 in [facility] Office Visit from 02/04/2020 in [facility] Telemedicine from 06/07/2019 in [facility]  PHQ-2 Total Score 1 0 0 1 2  PHQ-9 Total Score -- -- -- 7 11      Flowsheet Row Video Visit from 03/18/2021 in [facility] Video Visit from 09/10/2020 in [facility] Video Visit from 05/18/2020 [facility]  RISK  CATEGORY No Risk No Risk No Risk        Assessment and Plan: This patient is a 51 year old female with a history of fibromyalgia joint pain depression anxiety binge eating disorder as well as menopause.  I explained that she needs to get her physical health improved as much as possible.  She is going to work on this with her physicians as well as a nutritional program.  Since she is having the chronic pain we will discontinue Prozac in favor of Cymbalta 60 mg daily.  She will continue Wellbutrin SR 150 mg twice daily for depression Valium 50 mg daily for ADD and binge eating disorder and Valium 5 mg daily as needed for anxiety and sleep.  She will return to see me in 6-week   Diannia Ruder, MD 03/18/2021, 4:21 PM

## 2021-03-23 ENCOUNTER — Telehealth (HOSPITAL_COMMUNITY): Payer: Self-pay | Admitting: *Deleted

## 2021-03-23 NOTE — Telephone Encounter (Signed)
LMOM

## 2021-03-23 NOTE — Telephone Encounter (Signed)
LMOM for patient to call office to schedule f/u appt °

## 2021-03-23 NOTE — Telephone Encounter (Signed)
LMOM for patient to call office to sch f/u appt °

## 2021-04-21 ENCOUNTER — Telehealth (HOSPITAL_COMMUNITY): Payer: Self-pay | Admitting: *Deleted

## 2021-04-21 NOTE — Telephone Encounter (Signed)
Opened in Error.

## 2021-04-29 ENCOUNTER — Other Ambulatory Visit: Payer: Self-pay

## 2021-04-29 ENCOUNTER — Encounter (HOSPITAL_COMMUNITY): Payer: Self-pay | Admitting: Psychiatry

## 2021-04-29 ENCOUNTER — Telehealth (INDEPENDENT_AMBULATORY_CARE_PROVIDER_SITE_OTHER): Payer: BC Managed Care – PPO | Admitting: Psychiatry

## 2021-04-29 DIAGNOSIS — F5081 Binge eating disorder: Secondary | ICD-10-CM

## 2021-04-29 DIAGNOSIS — F320 Major depressive disorder, single episode, mild: Secondary | ICD-10-CM | POA: Diagnosis not present

## 2021-04-29 DIAGNOSIS — F900 Attention-deficit hyperactivity disorder, predominantly inattentive type: Secondary | ICD-10-CM

## 2021-04-29 MED ORDER — LISDEXAMFETAMINE DIMESYLATE 60 MG PO CAPS: 60.0000 mg | ORAL_CAPSULE | ORAL | 0 refills | Status: DC

## 2021-04-29 MED ORDER — BUPROPION HCL ER (SR) 150 MG PO TB12: 150.0000 mg | ORAL_TABLET | Freq: Two times a day (BID) | ORAL | 5 refills | Status: DC

## 2021-04-29 MED ORDER — DIAZEPAM 5 MG PO TABS: 5.0000 mg | ORAL_TABLET | Freq: Every day | ORAL | 2 refills | Status: DC | PRN

## 2021-04-29 MED ORDER — FLUOXETINE HCL 10 MG PO CAPS: 10.0000 mg | ORAL_CAPSULE | Freq: Every day | ORAL | 2 refills | Status: DC

## 2021-04-29 NOTE — Progress Notes (Signed)
Virtual Visit via Telephone Note ? ?I connected with Sheri Wood on 04/29/21 at  3:00 PM EDT by telephone and verified that I am speaking with the correct person using two identifiers. ? ?Location: ?Patient: home ?Provider: office ?  ?I discussed the limitations, risks, security and privacy concerns of performing an evaluation and management service by telephone and the availability of in person appointments. I also discussed with the patient that there may be a patient responsible charge related to this service. The patient expressed understanding and agreed to proceed. ? ? ? ?  ?I discussed the assessment and treatment plan with the patient. The patient was provided an opportunity to ask questions and all were answered. The patient agreed with the plan and demonstrated an understanding of the instructions. ?  ?The patient was advised to call back or seek an in-person evaluation if the symptoms worsen or if the condition fails to improve as anticipated. ? ?I provided 15 minutes of non-face-to-face time during this encounter. ? ? ?Sheri Spiller, MD ? ?[facility] MD/PA/NP OP Progress Note ? ?04/29/2021 3:15 PM ?Sheri Wood  ?MRN:  [id number] ? ?Chief Complaint:  ?Chief Complaint  ?Patient presents with  ? Depression  ? Anxiety  ? ADD  ? Follow-up  ? ?HPI: This patient is a 51 year old married white female who lives with her husband in [place].  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 ?The patient returns for follow-up after 4 weeks.  She is dated last time that she was having a lot of chronic pain.  Therefore I switched her from Prozac to Cymbalta.  This is making her extremely drowsy and it is difficult for her to function at work.  It does not help the pain that much so she would like to go back to Prozac.  She is also still on Wellbutrin.  She is having severe pain in her hands and is going back to a hand specialist next week.  She may be looking at surgery.  She has seronegative  arthritis and fibromyalgia so there are not that many specific treatments. ? ?On top of that she still dealing with the binge eating disorder.  She states she is now at 204 pounds.  Despite eating healthy food at home she gets junk food at work from [employer] and it is hard for her to stop.  Her nutritionist has given her a number for an eating disorder therapist in [place] and I think this will help tremendously.  In the meantime we will go up a little bit more on the Vyvanse. ?Visit Diagnosis:  ?  ICD-10-CM   ?1. Mild single current episode of major depressive disorder ([place])  F32.0   ?  ?2. Attention deficit hyperactivity disorder (ADHD), predominantly inattentive type  F90.0   ?  ?3. Binge eating disorder  F50.81   ?  ? ? ?Past Psychiatric History: Long-term outpatient treatment for depression and anxiety ? ?Past Medical History:  ?Past Medical History:  ?Diagnosis Date  ? Allergy   ? seasonal  ? Anxiety   ? Depression   ? Fibromyalgia   ? Gastritis   ? history  ? GERD (gastroesophageal reflux disease)   ? otc med prn  ? IBS (irritable bowel syndrome)   ? Migraine   ? SVD (spontaneous vaginal delivery)   ? x 2  ? Urticaria, chronic   ? Vertigo   ?  ?Past Surgical History:  ?Procedure Laterality Date  ? CHOLECYSTECTOMY  N/A 04/08/2020  ? Procedure: LAPAROSCOPIC CHOLECYSTECTOMY;  Surgeon: Virl Cagey, MD;  Location: [facility];  Service: General;  Laterality: N/A;  ? COLONOSCOPY  2008  ? negative  ? DIAGNOSTIC LAPAROSCOPY    ? fibroids  ? DILATION AND CURETTAGE OF UTERUS    ? DILITATION & CURRETTAGE/HYSTROSCOPY WITH NOVASURE ABLATION N/A 05/06/2013  ? Procedure: DILATATION & CURETTAGE/HYSTEROSCOPY WITH NOVASURE ABLATION;  Surgeon: Floyce Stakes. Pamala Hurry, MD;  Location: [facility];  Service: Gynecology;  Laterality: N/A;  ? EYE SURGERY    ? bilateral lasik  ? FOOT SURGERY  2017  ? LAPAROSCOPIC BILATERAL SALPINGECTOMY Bilateral 05/06/2013  ? Procedure: LAPAROSCOPIC BILATERAL SALPINGECTOMY;  Surgeon: Claiborne Billings A. Pamala Hurry,  MD;  Location: [facility];  Service: Gynecology;  Laterality: Bilateral;  ? WISDOM TOOTH EXTRACTION    ? ? ?Family Psychiatric History: see below ? ?Family History:  ?Family History  ?Problem Relation Age of Onset  ? Alcohol abuse Brother   ? Drug abuse Brother   ? Depression Mother   ? Anxiety disorder Mother   ? Anxiety disorder Brother   ? Depression Brother   ? Bipolar disorder Maternal Uncle   ? Anxiety disorder Paternal Grandfather   ? Depression Paternal Grandfather   ? Bipolar disorder Cousin   ? Breast cancer Maternal Aunt   ? ? ?Social History:  ?Social History  ? ?Socioeconomic History  ? Marital status: Married  ?  Spouse name: Not on file  ? Number of children: Not on file  ? Years of education: Not on file  ? Highest education level: Not on file  ?Occupational History  ? Not on file  ?Tobacco Use  ? Smoking status: Former  ?  Packs/day: 0.75  ?  Years: 10.00  ?  Pack years: 7.50  ?  Types: Cigarettes  ?  Quit date: 02/08/1988  ?  Years since quitting: 33.2  ? Smokeless tobacco: Never  ?Substance and Sexual Activity  ? Alcohol use: No  ? Drug use: No  ? Sexual activity: Yes  ?  Birth control/protection: None  ?Other Topics Concern  ? Not on file  ?Social History Narrative  ? Not on file  ? ?Social Determinants of Health  ? ?Financial Resource Strain: Not on file  ?Food Insecurity: Not on file  ?Transportation Needs: Not on file  ?Physical Activity: Not on file  ?Stress: Not on file  ?Social Connections: Not on file  ? ? ?Allergies:  ?Allergies  ?Allergen Reactions  ? Ciprofloxacin Other (See Comments)  ?  Severe muscle and joint pain  ? Cefzil [Cefprozil]   ?  possible hives  ? Augmentin [Amoxicillin-Pot Clavulanate] Nausea And Vomiting  ?  Throat felt funny and gave pt anxiety  ? Bee Venom Swelling  ? ? ?Metabolic Disorder Labs: ?No results found for: HGBA1C, MPG ?No results found for: PROLACTIN ?Lab Results  ?Component Value Date  ? CHOL 192 04/02/2015  ? TRIG 69 04/02/2015  ? HDL 60 04/02/2015  ? CHOLHDL  3.2 04/02/2015  ? VLDL 14 04/02/2015  ? LDLCALC 118 (H) 04/02/2015  ? ?Lab Results  ?Component Value Date  ? TSH 0.922 01/14/2020  ? TSH 1.940 03/23/2015  ? ? ?Therapeutic Level Labs: ?No results found for: LITHIUM ?No results found for: VALPROATE ?No components found for:  CBMZ ? ?Current Medications: ?Current Outpatient Medications  ?Medication Sig Dispense Refill  ? FLUoxetine (PROZAC) 10 MG capsule Take 1 capsule (10 mg total) by mouth daily. 30 capsule 2  ? lisdexamfetamine (VYVANSE)  60 MG capsule Take 1 capsule (60 mg total) by mouth every morning. 30 capsule 0  ? lisdexamfetamine (VYVANSE) 60 MG capsule Take 1 capsule (60 mg total) by mouth every morning. 30 capsule 0  ? buPROPion (WELLBUTRIN SR) 150 MG 12 hr tablet Take 1 tablet (150 mg total) by mouth 2 (two) times daily. 60 tablet 5  ? diazepam (VALIUM) 5 MG tablet Take 1 tablet (5 mg total) by mouth daily as needed for anxiety. 30 tablet 2  ? diclofenac (VOLTAREN) 75 MG EC tablet Take by mouth 2 (two) times daily.    ? eletriptan (RELPAX) 40 MG tablet May repeat in 2 hours if headache persists or recurs. (Patient taking differently: Take 40 mg by mouth every 2 (two) hours as needed for migraine (May repeat in 2 hours if headache persists or recurs.).) 8 tablet 0  ? ondansetron (ZOFRAN) 4 MG tablet Take 1 tablet (4 mg total) by mouth every 8 (eight) hours as needed for nausea or vomiting. 20 tablet 0  ? pramipexole (MIRAPEX) 1.5 MG tablet Take 1 tablet (1.5 mg total) by mouth at bedtime. 30 tablet 5  ? pregabalin (LYRICA) 25 MG capsule Take 25 mg by mouth 2 (two) times daily.    ? SPRINTEC 28 0.25-35 MG-MCG tablet Take 1 tablet by mouth at bedtime.    ? ?No current facility-administered medications for this visit.  ? ? ? ?Musculoskeletal: ?Strength & Muscle Tone: na ?Gait & Station: na ?Patient leans: N/A ? ?Psychiatric Specialty Exam: ?Review of Systems  ?Constitutional:  Positive for unexpected weight change.  ?Musculoskeletal:  Positive for arthralgias  and myalgias.  ?All other systems reviewed and are negative.  ?There were no vitals taken for this visit.There is no height or weight on file to calculate BMI.  ?General Appearance: na  ?Eye Contact:  N

## 2021-05-15 ENCOUNTER — Telehealth: Payer: BC Managed Care – PPO | Admitting: Nurse Practitioner

## 2021-05-15 DIAGNOSIS — G43909 Migraine, unspecified, not intractable, without status migrainosus: Secondary | ICD-10-CM

## 2021-05-15 MED ORDER — ELETRIPTAN HYDROBROMIDE 40 MG PO TABS: 40.0000 mg | ORAL_TABLET | ORAL | 0 refills | Status: AC | PRN

## 2021-05-15 NOTE — Progress Notes (Signed)
Patient doing evisit for med refill. She has history of migraines, uin which she takes relpax for. During allergy season her migraine increase and she has to take her relpax. She develoed migraine today and is out of relpax and cannot reach her PCP until Monday. Would like refill. ? ?Meds ordered this encounter  ?Medications  ? eletriptan (RELPAX) 40 MG tablet  ?  Sig: Take 1 tablet (40 mg total) by mouth every 2 (two) hours as needed for migraine (May repeat in 2 hours if headache persists or recurs.).  ?  Dispense:  10 tablet  ?  Refill:  0  ?  Order Specific Question:   Supervising Provider  ?  Answer:   Eber Hong [[id number]]  ? ?5-10 minutes spent reviewing and documenting in chart. ? ? ?Mary-Margaret Daphine Deutscher, FNP ? ?

## 2021-06-21 ENCOUNTER — Telehealth: Payer: BC Managed Care – PPO | Admitting: Physician Assistant

## 2021-06-21 DIAGNOSIS — R3989 Other symptoms and signs involving the genitourinary system: Secondary | ICD-10-CM | POA: Diagnosis not present

## 2021-06-21 DIAGNOSIS — B379 Candidiasis, unspecified: Secondary | ICD-10-CM

## 2021-06-21 DIAGNOSIS — T3695XA Adverse effect of unspecified systemic antibiotic, initial encounter: Secondary | ICD-10-CM | POA: Diagnosis not present

## 2021-06-21 MED ORDER — FLUCONAZOLE 150 MG PO TABS: 150.0000 mg | ORAL_TABLET | ORAL | 0 refills | Status: DC | PRN

## 2021-06-21 MED ORDER — NITROFURANTOIN MONOHYD MACRO 100 MG PO CAPS: 100.0000 mg | ORAL_CAPSULE | Freq: Two times a day (BID) | ORAL | 0 refills | Status: DC

## 2021-06-21 NOTE — Progress Notes (Signed)

## 2021-06-23 ENCOUNTER — Other Ambulatory Visit: Payer: Self-pay | Admitting: Orthopedic Surgery

## 2021-06-29 ENCOUNTER — Telehealth (INDEPENDENT_AMBULATORY_CARE_PROVIDER_SITE_OTHER): Payer: BC Managed Care – PPO | Admitting: Psychiatry

## 2021-06-29 ENCOUNTER — Telehealth (HOSPITAL_COMMUNITY): Payer: BC Managed Care – PPO | Admitting: Psychiatry

## 2021-06-29 ENCOUNTER — Encounter (HOSPITAL_COMMUNITY): Payer: Self-pay | Admitting: Psychiatry

## 2021-06-29 DIAGNOSIS — F320 Major depressive disorder, single episode, mild: Secondary | ICD-10-CM

## 2021-06-29 DIAGNOSIS — F900 Attention-deficit hyperactivity disorder, predominantly inattentive type: Secondary | ICD-10-CM | POA: Diagnosis not present

## 2021-06-29 DIAGNOSIS — F5081 Binge eating disorder: Secondary | ICD-10-CM | POA: Diagnosis not present

## 2021-06-29 MED ORDER — LISDEXAMFETAMINE DIMESYLATE 60 MG PO CAPS: 60.0000 mg | ORAL_CAPSULE | ORAL | 0 refills | Status: DC

## 2021-06-29 MED ORDER — FLUOXETINE HCL 10 MG PO CAPS: 10.0000 mg | ORAL_CAPSULE | Freq: Every day | ORAL | 2 refills | Status: DC

## 2021-06-29 MED ORDER — BUPROPION HCL ER (SR) 150 MG PO TB12: 150.0000 mg | ORAL_TABLET | Freq: Two times a day (BID) | ORAL | 5 refills | Status: DC

## 2021-06-29 NOTE — Progress Notes (Signed)
Virtual Visit via Telephone Note  I connected with Sheri Wood on 06/29/21 at  9:40 AM EDT by telephone and verified that I am speaking with the correct person using two identifiers.  Location: Patient: home Provider: office   I discussed the limitations, risks, security and privacy concerns of performing an evaluation and management service by telephone and the availability of in person appointments. I also discussed with the patient that there may be a patient responsible charge related to this service. The patient expressed understanding and agreed to proceed.      I discussed the assessment and treatment plan with the patient. The patient was provided an opportunity to ask questions and all were answered. The patient agreed with the plan and demonstrated an understanding of the instructions.   The patient was advised to call back or seek an in-person evaluation if the symptoms worsen or if the condition fails to improve as anticipated.  I provided 15 minutes of non-face-to-face time during this encounter.   Diannia Ruder, MD  [facility] MD/PA/NP OP Progress Note  06/29/2021 10:12 AM Sheri Wood  MRN:  [id number]  Chief Complaint:  Chief Complaint  Patient presents with   Depression   Anxiety   ADD   Follow-up   HPI: This patient is a 51 year old married white female who lives with her husband in [place].  She is a Patent examiner for [employer]  The patient returns for follow-up after 2 months regarding her depression and anxiety and binge eating disorder.  She states that she is doing okay but not great.  The addition of Prozac has helped some of her OCD and obsessional symptoms.  She still worries a lot about all sorts of things in her life.  She and her husband are remodeling their house and it makes her very uncomfortable to have the house "in a shambles."  She claims that they will be done sometime this summer.  She still having a lot of  arthritic pain and body aches and fatigue consistent with seronegative arthritis and fibromyalgia.  We tried adding Cymbalta but it made her too drowsy.  The little bit of Prozac 10 mg seems to be helping a bit.  The Vyvanse is helping her focus through the day and cut down on the binge eating.  She denies thoughts of self-harm or suicide.  I suggested going up on the Prozac to help more with the OCD and other symptoms but she wants to think about it. Visit Diagnosis:    ICD-10-CM   1. Mild single current episode of major depressive disorder (HCC)  F32.0     2. Attention deficit hyperactivity disorder (ADHD), predominantly inattentive type  F90.0     3. Binge eating disorder  F50.81       Past Psychiatric History: Long-term outpatient treatment for depression and anxiety  Past Medical History:  Past Medical History:  Diagnosis Date   Allergy    seasonal   Anxiety    Depression    Fibromyalgia    Gastritis    history   GERD (gastroesophageal reflux disease)    otc med prn   IBS (irritable bowel syndrome)    Migraine    SVD (spontaneous vaginal delivery)    x 2   Urticaria, chronic    Vertigo     Past Surgical History:  Procedure Laterality Date   CHOLECYSTECTOMY N/A 04/08/2020   Procedure: LAPAROSCOPIC CHOLECYSTECTOMY;  Surgeon: [name], MD;  Location:  [facility];  Service: General;  Laterality: N/A;   COLONOSCOPY  2008   negative   DIAGNOSTIC LAPAROSCOPY     fibroids   DILATION AND CURETTAGE OF UTERUS     DILITATION & CURRETTAGE/HYSTROSCOPY WITH NOVASURE ABLATION N/A 05/06/2013   Procedure: DILATATION & CURETTAGE/HYSTEROSCOPY WITH NOVASURE ABLATION;  Surgeon: Alphonsus Sias. Ernestina Penna, MD;  Location: [facility];  Service: Gynecology;  Laterality: N/A;   EYE SURGERY     bilateral lasik   FOOT SURGERY  2017   LAPAROSCOPIC BILATERAL SALPINGECTOMY Bilateral 05/06/2013   Procedure: LAPAROSCOPIC BILATERAL SALPINGECTOMY;  Surgeon: Tresa Endo A. Ernestina Penna, MD;  Location: [facility];  Service:  Gynecology;  Laterality: Bilateral;   WISDOM TOOTH EXTRACTION      Family Psychiatric History: See below  Family History:  Family History  Problem Relation Age of Onset   Alcohol abuse Brother    Drug abuse Brother    Depression Mother    Anxiety disorder Mother    Anxiety disorder Brother    Depression Brother    Bipolar disorder Maternal Uncle    Anxiety disorder Paternal Grandfather    Depression Paternal Grandfather    Bipolar disorder Cousin    Breast cancer Maternal Aunt     Social History:  Social History   Socioeconomic History   Marital status: Married    Spouse name: Not on file   Number of children: Not on file   Years of education: Not on file   Highest education level: Not on file  Occupational History   Not on file  Tobacco Use   Smoking status: Former    Packs/day: 0.75    Years: 10.00    Pack years: 7.50    Types: Cigarettes    Quit date: 02/08/1988    Years since quitting: 33.4   Smokeless tobacco: Never  Substance and Sexual Activity   Alcohol use: No   Drug use: No   Sexual activity: Yes    Birth control/protection: None  Other Topics Concern   Not on file  Social History Narrative   Not on file   Social Determinants of Health   Financial Resource Strain: Not on file  Food Insecurity: Not on file  Transportation Needs: Not on file  Physical Activity: Not on file  Stress: Not on file  Social Connections: Not on file    Allergies:  Allergies  Allergen Reactions   Ciprofloxacin Other (See Comments)    Severe muscle and joint pain   Cefzil [Cefprozil]     possible hives   Augmentin [Amoxicillin-Pot Clavulanate] Nausea And Vomiting    Throat felt funny and gave pt anxiety   Bee Venom Swelling    Metabolic Disorder Labs: No results found for: HGBA1C, MPG No results found for: PROLACTIN Lab Results  Component Value Date   CHOL 192 04/02/2015   TRIG 69 04/02/2015   HDL 60 04/02/2015   CHOLHDL 3.2 04/02/2015   VLDL 14  04/02/2015   LDLCALC 118 (H) 04/02/2015   Lab Results  Component Value Date   TSH 0.922 01/14/2020   TSH 1.940 03/23/2015    Therapeutic Level Labs: No results found for: LITHIUM No results found for: VALPROATE No components found for:  CBMZ  Current Medications: Current Outpatient Medications  Medication Sig Dispense Refill   lisdexamfetamine (VYVANSE) 60 MG capsule Take 1 capsule (60 mg total) by mouth every morning. 30 capsule 0   buPROPion (WELLBUTRIN SR) 150 MG 12 hr tablet Take 1 tablet (150 mg total) by mouth  2 (two) times daily. 60 tablet 5   diazepam (VALIUM) 5 MG tablet Take 1 tablet (5 mg total) by mouth daily as needed for anxiety. 30 tablet 2   diclofenac (VOLTAREN) 75 MG EC tablet Take by mouth 2 (two) times daily.     eletriptan (RELPAX) 40 MG tablet Take 1 tablet (40 mg total) by mouth every 2 (two) hours as needed for migraine (May repeat in 2 hours if headache persists or recurs.). 10 tablet 0   fluconazole (DIFLUCAN) 150 MG tablet Take 1 tablet (150 mg total) by mouth every 3 (three) days as needed. 2 tablet 0   FLUoxetine (PROZAC) 10 MG capsule Take 1 capsule (10 mg total) by mouth daily. 30 capsule 2   lisdexamfetamine (VYVANSE) 60 MG capsule Take 1 capsule (60 mg total) by mouth every morning. 30 capsule 0   lisdexamfetamine (VYVANSE) 60 MG capsule Take 1 capsule (60 mg total) by mouth every morning. 30 capsule 0   nitrofurantoin, macrocrystal-monohydrate, (MACROBID) 100 MG capsule Take 1 capsule (100 mg total) by mouth 2 (two) times daily. 10 capsule 0   ondansetron (ZOFRAN) 4 MG tablet Take 1 tablet (4 mg total) by mouth every 8 (eight) hours as needed for nausea or vomiting. 20 tablet 0   pramipexole (MIRAPEX) 1.5 MG tablet Take 1 tablet (1.5 mg total) by mouth at bedtime. 30 tablet 5   pregabalin (LYRICA) 25 MG capsule Take 25 mg by mouth 2 (two) times daily.     SPRINTEC 28 0.25-35 MG-MCG tablet Take 1 tablet by mouth at bedtime.     No current  facility-administered medications for this visit.     Musculoskeletal: Strength & Muscle Tone: na Gait & Station: na Patient leans: N/A  Psychiatric Specialty Exam: Review of Systems  Constitutional:  Positive for fatigue.  Psychiatric/Behavioral:  The patient is nervous/anxious.   All other systems reviewed and are negative.  There were no vitals taken for this visit.There is no height or weight on file to calculate BMI.  General Appearance: NA  Eye Contact:  NA  Speech:  Clear and Coherent  Volume:  Normal  Mood:  Anxious  Affect:  NA  Thought Process:  Goal Directed  Orientation:  Full (Time, Place, and Person)  Thought Content: Rumination   Suicidal Thoughts:  No  Homicidal Thoughts:  No  Memory:  Immediate;   Good Recent;   Good Remote;   Good  Judgement:  Good  Insight:  Fair  Psychomotor Activity:  Decreased  Concentration:  Concentration: Fair and Attention Span: Fair  Recall:  Good  Fund of Knowledge: Good  Language: Good  Akathisia:  No  Handed:  Right  AIMS (if indicated): not done  Assets:  Communication Skills Desire for Improvement Resilience Social Support Talents/Skills  ADL's:  Intact  Cognition: WNL  Sleep:  Fair   Screenings: GAD-7    Flowsheet Row [facility]med[facility]07[facility]/2017 in [facility]  Total GAD-7 Score 14 12      PHQ2-9    Flowsheet Row [facility] Video Visit from 04/29/2021 in [facility] Video Visit from 03[facility]18/2021 in [facility] Video Visit from 09[facility]10/2020 in [facility] Video Visit from [facility]/18/2020 [facility]  PHQ-2 Total Score 2 1 1  0 0  PHQ-9 Total Score 10 -- -- -- --  Flowsheet Row Video Visit from  06/29/2021 [facility] Video Visit from 04/29/2021 in [facility] Video Visit from 03/18/2021 [facility]  C-SSRS RISK CATEGORY No Risk No Risk No Risk        Assessment and Plan: This patient is a 52 year old female with a history of fibromyalgia joint pain depression anxiety and binge eating disorder.  She does have some relief in the obsessional symptoms with the Prozac 10 mg daily and she does not really want to go any higher at this point.  She will also continue Wellbutrin SR 150 mg twice daily for depression.  Vyvanse will be continued at 60 mg daily for ADD and binge eating disorder and Valium 5 mg daily as needed for anxiety and sleep.  She will return to see me in 3 months  Collaboration of Care: Collaboration of Care: Primary Care Provider AEB chart notes are available to PCP through the epic system  Patient/Guardian was advised Release of Information must be obtained prior to any record release in order to collaborate their care with an outside provider. Patient/Guardian was advised if they have not already done so to contact the registration department to sign all necessary forms in order for [place] to release information regarding their care.   Consent: Patient/Guardian gives verbal consent for treatment and assignment of benefits for services provided during this visit. Patient/Guardian expressed understanding and agreed to proceed.    Diannia Ruder, MD 06/29/2021, 10:13 AM

## 2021-07-21 ENCOUNTER — Other Ambulatory Visit: Payer: Self-pay

## 2021-07-21 ENCOUNTER — Encounter (HOSPITAL_BASED_OUTPATIENT_CLINIC_OR_DEPARTMENT_OTHER): Payer: Self-pay | Admitting: Orthopedic Surgery

## 2021-07-29 ENCOUNTER — Ambulatory Visit (HOSPITAL_BASED_OUTPATIENT_CLINIC_OR_DEPARTMENT_OTHER): Payer: BC Managed Care – PPO | Admitting: Anesthesiology

## 2021-07-29 ENCOUNTER — Ambulatory Visit (HOSPITAL_BASED_OUTPATIENT_CLINIC_OR_DEPARTMENT_OTHER): Payer: BC Managed Care – PPO

## 2021-07-29 ENCOUNTER — Ambulatory Visit (HOSPITAL_BASED_OUTPATIENT_CLINIC_OR_DEPARTMENT_OTHER)
Admission: RE | Admit: 2021-07-29 | Discharge: 2021-07-29 | Disposition: A | Discharge status: Home / Self Care | Payer: BC Managed Care – PPO | Source: Ambulatory Visit | Attending: Orthopedic Surgery | Admitting: Orthopedic Surgery

## 2021-07-29 ENCOUNTER — Encounter (HOSPITAL_BASED_OUTPATIENT_CLINIC_OR_DEPARTMENT_OTHER): Payer: Self-pay | Admitting: Orthopedic Surgery

## 2021-07-29 ENCOUNTER — Encounter (HOSPITAL_BASED_OUTPATIENT_CLINIC_OR_DEPARTMENT_OTHER)
Admission: RE | Disposition: A | Discharge status: Home / Self Care | Payer: Self-pay | Source: Ambulatory Visit | Attending: Orthopedic Surgery

## 2021-07-29 ENCOUNTER — Other Ambulatory Visit: Payer: Self-pay

## 2021-07-29 DIAGNOSIS — Z79899 Other long term (current) drug therapy: Secondary | ICD-10-CM | POA: Diagnosis not present

## 2021-07-29 DIAGNOSIS — K219 Gastro-esophageal reflux disease without esophagitis: Secondary | ICD-10-CM | POA: Insufficient documentation

## 2021-07-29 DIAGNOSIS — Z87891 Personal history of nicotine dependence: Secondary | ICD-10-CM | POA: Insufficient documentation

## 2021-07-29 DIAGNOSIS — F419 Anxiety disorder, unspecified: Secondary | ICD-10-CM | POA: Diagnosis not present

## 2021-07-29 DIAGNOSIS — F32A Depression, unspecified: Secondary | ICD-10-CM | POA: Diagnosis not present

## 2021-07-29 DIAGNOSIS — M1811 Unilateral primary osteoarthritis of first carpometacarpal joint, right hand: Secondary | ICD-10-CM | POA: Insufficient documentation

## 2021-07-29 DIAGNOSIS — M797 Fibromyalgia: Secondary | ICD-10-CM | POA: Insufficient documentation

## 2021-07-29 DIAGNOSIS — Z01818 Encounter for other preprocedural examination: Secondary | ICD-10-CM

## 2021-07-29 DIAGNOSIS — G709 Myoneural disorder, unspecified: Secondary | ICD-10-CM | POA: Diagnosis not present

## 2021-07-29 HISTORY — PX: TENDON TRANSFER: SHX6109

## 2021-07-29 HISTORY — PX: CARPOMETACARPEL SUSPENSION PLASTY: SHX5005

## 2021-07-29 LAB — POCT PREGNANCY, URINE: Preg Test, Ur: NEGATIVE

## 2021-07-29 SURGERY — CARPOMETACARPEL (CMC) SUSPENSION PLASTY
Anesthesia: General | Laterality: Right

## 2021-07-29 MED ORDER — OXYCODONE HCL 5 MG PO TABS: 5.0000 mg | ORAL_TABLET | Freq: Once | ORAL | Status: DC | PRN

## 2021-07-29 MED ORDER — MEPERIDINE HCL 25 MG/ML IJ SOLN: 6.2500 mg | INTRAMUSCULAR | Status: DC | PRN

## 2021-07-29 MED ORDER — ONDANSETRON HCL 4 MG/2ML IJ SOLN
INTRAMUSCULAR | Status: DC | PRN
  Administered 2021-07-29: 4 mg via INTRAVENOUS

## 2021-07-29 MED ORDER — ROPIVACAINE HCL 7.5 MG/ML IJ SOLN
INTRAMUSCULAR | Status: DC | PRN
  Administered 2021-07-29: 20 mL via PERINEURAL

## 2021-07-29 MED ORDER — MIDAZOLAM HCL 2 MG/2ML IJ SOLN
INTRAMUSCULAR | Status: AC
  Filled 2021-07-29: qty 2

## 2021-07-29 MED ORDER — ONDANSETRON HCL 4 MG/2ML IJ SOLN
4.0000 mg | Freq: Once | INTRAMUSCULAR | Status: DC | PRN
Start: 2021-07-29 — End: 2021-07-29

## 2021-07-29 MED ORDER — PHENYLEPHRINE 80 MCG/ML (10ML) SYRINGE FOR IV PUSH (FOR BLOOD PRESSURE SUPPORT)
PREFILLED_SYRINGE | INTRAVENOUS | Status: AC
  Filled 2021-07-29: qty 20

## 2021-07-29 MED ORDER — FENTANYL CITRATE (PF) 100 MCG/2ML IJ SOLN: 25.0000 ug | INTRAMUSCULAR | Status: DC | PRN

## 2021-07-29 MED ORDER — ACETAMINOPHEN 160 MG/5ML PO SOLN: 325.0000 mg | ORAL | Status: DC | PRN

## 2021-07-29 MED ORDER — LACTATED RINGERS IV SOLN: INTRAVENOUS | Status: DC

## 2021-07-29 MED ORDER — OXYCODONE HCL 5 MG/5ML PO SOLN: 5.0000 mg | Freq: Once | ORAL | Status: DC | PRN

## 2021-07-29 MED ORDER — HYDROCODONE-ACETAMINOPHEN 5-325 MG PO TABS: ORAL_TABLET | ORAL | 0 refills | Status: DC

## 2021-07-29 MED ORDER — EPHEDRINE 5 MG/ML INJ
INTRAVENOUS | Status: AC
  Filled 2021-07-29: qty 5

## 2021-07-29 MED ORDER — VANCOMYCIN HCL IN DEXTROSE 1-5 GM/200ML-% IV SOLN
1000.0000 mg | INTRAVENOUS | Status: AC
  Administered 2021-07-29: 1000 mg via INTRAVENOUS

## 2021-07-29 MED ORDER — LIDOCAINE HCL (CARDIAC) PF 100 MG/5ML IV SOSY
PREFILLED_SYRINGE | INTRAVENOUS | Status: DC | PRN
  Administered 2021-07-29: 50 mg via INTRAVENOUS

## 2021-07-29 MED ORDER — FENTANYL CITRATE (PF) 100 MCG/2ML IJ SOLN
INTRAMUSCULAR | Status: AC
  Filled 2021-07-29: qty 2

## 2021-07-29 MED ORDER — LACTATED RINGERS IV SOLN: INTRAVENOUS | Status: DC | PRN

## 2021-07-29 MED ORDER — DEXAMETHASONE SODIUM PHOSPHATE 10 MG/ML IJ SOLN
INTRAMUSCULAR | Status: DC | PRN
  Administered 2021-07-29: 10 mg via INTRAVENOUS

## 2021-07-29 MED ORDER — FENTANYL CITRATE (PF) 100 MCG/2ML IJ SOLN
100.0000 ug | Freq: Once | INTRAMUSCULAR | Status: AC
  Administered 2021-07-29: 100 ug via INTRAVENOUS

## 2021-07-29 MED ORDER — DEXAMETHASONE SODIUM PHOSPHATE 10 MG/ML IJ SOLN
INTRAMUSCULAR | Status: DC | PRN
  Administered 2021-07-29: 10 mg

## 2021-07-29 MED ORDER — CEFAZOLIN SODIUM-DEXTROSE 2-4 GM/100ML-% IV SOLN
INTRAVENOUS | Status: AC
  Filled 2021-07-29: qty 100

## 2021-07-29 MED ORDER — FENTANYL CITRATE (PF) 100 MCG/2ML IJ SOLN
INTRAMUSCULAR | Status: DC | PRN
  Administered 2021-07-29: 50 ug via INTRAVENOUS
  Administered 2021-07-29 (×2): 25 ug via INTRAVENOUS

## 2021-07-29 MED ORDER — ACETAMINOPHEN 325 MG PO TABS: 325.0000 mg | ORAL_TABLET | ORAL | Status: DC | PRN

## 2021-07-29 MED ORDER — PHENYLEPHRINE HCL (PRESSORS) 10 MG/ML IV SOLN
INTRAVENOUS | Status: DC | PRN
  Administered 2021-07-29 (×9): 160 ug via INTRAVENOUS
  Administered 2021-07-29: 80 ug via INTRAVENOUS
  Administered 2021-07-29: 160 ug via INTRAVENOUS

## 2021-07-29 MED ORDER — VANCOMYCIN HCL 1000 MG IV SOLR
INTRAVENOUS | Status: DC | PRN
  Administered 2021-07-29: 1000 mg via INTRAVENOUS

## 2021-07-29 MED ORDER — EPHEDRINE SULFATE (PRESSORS) 50 MG/ML IJ SOLN
INTRAMUSCULAR | Status: DC | PRN
  Administered 2021-07-29 (×2): 10 mg via INTRAVENOUS

## 2021-07-29 MED ORDER — MIDAZOLAM HCL 2 MG/2ML IJ SOLN
2.0000 mg | Freq: Once | INTRAMUSCULAR | Status: AC
  Administered 2021-07-29: 2 mg via INTRAVENOUS

## 2021-07-29 MED ORDER — PROPOFOL 10 MG/ML IV BOLUS
INTRAVENOUS | Status: DC | PRN
  Administered 2021-07-29 (×2): 200 mg via INTRAVENOUS

## 2021-07-29 SURGICAL SUPPLY — 91 items
APL PRP STRL LF DISP 70% ISPRP (MISCELLANEOUS) ×1
BAG DECANTER FOR FLEXI CONT (MISCELLANEOUS) IMPLANT
BALL CTTN LRG ABS STRL LF (GAUZE/BANDAGES/DRESSINGS)
BIT DRILL PASSING CMC 1/4 FLEX (BIT) IMPLANT
BLADE MINI RND TIP GREEN BEAV (BLADE) ×2 IMPLANT
BLADE SURG 15 STRL LF DISP TIS (BLADE) ×2 IMPLANT
BLADE SURG 15 STRL SS (BLADE) ×4
BNDG CMPR 9X4 STRL LF SNTH (GAUZE/BANDAGES/DRESSINGS) ×1
BNDG ELASTIC 2X5.8 VLCR STR LF (GAUZE/BANDAGES/DRESSINGS) IMPLANT
BNDG ELASTIC 3X5.8 VLCR STR LF (GAUZE/BANDAGES/DRESSINGS) ×2 IMPLANT
BNDG ESMARK 4X9 LF (GAUZE/BANDAGES/DRESSINGS) ×2 IMPLANT
BNDG GAUZE DERMACEA FLUFF (GAUZE/BANDAGES/DRESSINGS) ×1
BNDG GAUZE DERMACEA FLUFF 4 (GAUZE/BANDAGES/DRESSINGS) ×1 IMPLANT
BNDG GZE DERMACEA 4 6PLY (GAUZE/BANDAGES/DRESSINGS) ×1
BUTTON ALL-SUT W/BACKSTOP (Orthopedic Implant) ×1 IMPLANT
CHLORAPREP W/TINT 26 (MISCELLANEOUS) ×2 IMPLANT
CORD BIPOLAR FORCEPS 12FT (ELECTRODE) ×2 IMPLANT
COTTONBALL LRG STERILE PKG (GAUZE/BANDAGES/DRESSINGS) IMPLANT
COVER BACK TABLE 60X90IN (DRAPES) ×2 IMPLANT
COVER MAYO STAND STRL (DRAPES) ×2 IMPLANT
CUFF TOURN SGL QUICK 18X4 (TOURNIQUET CUFF) ×2 IMPLANT
DRAPE EXTREMITY T 121X128X90 (DISPOSABLE) ×2 IMPLANT
DRAPE OEC MINIVIEW 54X84 (DRAPES) ×2 IMPLANT
DRAPE SURG 17X23 STRL (DRAPES) ×2 IMPLANT
DRILL PASSING CMC 1/4 FLEX (BIT) ×2
DRSG PAD ABDOMINAL 8X10 ST (GAUZE/BANDAGES/DRESSINGS) IMPLANT
GAUZE 4X4 16PLY ~~LOC~~+RFID DBL (SPONGE) IMPLANT
GAUZE SPONGE 4X4 12PLY STRL (GAUZE/BANDAGES/DRESSINGS) ×2 IMPLANT
GAUZE XEROFORM 1X8 LF (GAUZE/BANDAGES/DRESSINGS) ×2 IMPLANT
GLOVE BIO SURGEON STRL SZ7.5 (GLOVE) ×2 IMPLANT
GLOVE BIOGEL PI IND STRL 8 (GLOVE) ×1 IMPLANT
GLOVE BIOGEL PI IND STRL 8.5 (GLOVE) IMPLANT
GLOVE BIOGEL PI INDICATOR 8 (GLOVE) ×1
GLOVE BIOGEL PI INDICATOR 8.5 (GLOVE)
GLOVE SURG ORTHO 8.0 STRL STRW (GLOVE) IMPLANT
GOWN STRL REUS W/ TWL LRG LVL3 (GOWN DISPOSABLE) ×1 IMPLANT
GOWN STRL REUS W/TWL LRG LVL3 (GOWN DISPOSABLE) ×2
GOWN STRL REUS W/TWL XL LVL3 (GOWN DISPOSABLE) ×2 IMPLANT
LOOP VESSEL MAXI BLUE (MISCELLANEOUS) IMPLANT
NDL HYPO 25X1 1.5 SAFETY (NEEDLE) IMPLANT
NDL KEITH (NEEDLE) IMPLANT
NDL SAFETY ECLIPSE 18X1.5 (NEEDLE) IMPLANT
NEEDLE HYPO 18GX1.5 SHARP (NEEDLE)
NEEDLE HYPO 25X1 1.5 SAFETY (NEEDLE) IMPLANT
NEEDLE KEITH (NEEDLE) IMPLANT
NS IRRIG 1000ML POUR BTL (IV SOLUTION) ×2 IMPLANT
PACK BASIN DAY SURGERY FS (CUSTOM PROCEDURE TRAY) ×2 IMPLANT
PAD CAST 3X4 CTTN HI CHSV (CAST SUPPLIES) ×1 IMPLANT
PAD CAST 4YDX4 CTTN HI CHSV (CAST SUPPLIES) IMPLANT
PADDING CAST ABS 3INX4YD NS (CAST SUPPLIES)
PADDING CAST ABS 4INX4YD NS (CAST SUPPLIES) ×1
PADDING CAST ABS COTTON 3X4 (CAST SUPPLIES) IMPLANT
PADDING CAST ABS COTTON 4X4 ST (CAST SUPPLIES) ×1 IMPLANT
PADDING CAST COTTON 3X4 STRL (CAST SUPPLIES) ×2
PADDING CAST COTTON 4X4 STRL (CAST SUPPLIES)
SLEEVE SCD COMPRESS KNEE MED (STOCKING) IMPLANT
SPIKE FLUID TRANSFER (MISCELLANEOUS) IMPLANT
SPLINT FAST PLASTER 5X30 (CAST SUPPLIES)
SPLINT PLASTER CAST FAST 5X30 (CAST SUPPLIES) IMPLANT
SPLINT PLASTER CAST XFAST 3X15 (CAST SUPPLIES) IMPLANT
SPLINT PLASTER CAST XFAST 4X15 (CAST SUPPLIES) IMPLANT
SPLINT PLASTER XTRA FAST SET 4 (CAST SUPPLIES)
SPLINT PLASTER XTRA FASTSET 3X (CAST SUPPLIES)
STOCKINETTE 4X48 STRL (DRAPES) ×2 IMPLANT
SUT CHROMIC 5 0 P 3 (SUTURE) IMPLANT
SUT ETHIBOND 3-0 V-5 (SUTURE) IMPLANT
SUT ETHILON 3 0 PS 1 (SUTURE) IMPLANT
SUT ETHILON 4 0 PS 2 18 (SUTURE) ×2 IMPLANT
SUT FIBERWIRE 2-0 18 17.9 3/8 (SUTURE) ×2
SUT FIBERWIRE 3-0 18 TAPR NDL (SUTURE)
SUT FIBERWIRE 4-0 18 DIAM BLUE (SUTURE)
SUT MERSILENE 2.0 SH NDLE (SUTURE) IMPLANT
SUT MERSILENE 4 0 P 3 (SUTURE) IMPLANT
SUT PROLENE 2 0 SH DA (SUTURE) IMPLANT
SUT PROLENE 6 0 P 1 18 (SUTURE) IMPLANT
SUT SILK 2 0 PERMA HAND 18 BK (SUTURE) IMPLANT
SUT SILK 4 0 PS 2 (SUTURE) IMPLANT
SUT VIC AB 3-0 PS1 18 (SUTURE)
SUT VIC AB 3-0 PS1 18XBRD (SUTURE) IMPLANT
SUT VIC AB 4-0 P-3 18XBRD (SUTURE) IMPLANT
SUT VIC AB 4-0 P3 18 (SUTURE)
SUT VICRYL 0 SH 27 (SUTURE) IMPLANT
SUT VICRYL 4-0 PS2 18IN ABS (SUTURE) ×2 IMPLANT
SUTURE FIBERWR 2-0 18 17.9 3/8 (SUTURE) ×1 IMPLANT
SUTURE FIBERWR 3-0 18 TAPR NDL (SUTURE) IMPLANT
SUTURE FIBERWR 4-0 18 DIA BLUE (SUTURE) IMPLANT
SYR BULB EAR ULCER 3OZ GRN STR (SYRINGE) ×2 IMPLANT
SYR CONTROL 10ML LL (SYRINGE) IMPLANT
TOWEL GREEN STERILE FF (TOWEL DISPOSABLE) ×4 IMPLANT
TUBE FEEDING ENTERAL 5FR 16IN (TUBING) IMPLANT
UNDERPAD 30X36 HEAVY ABSORB (UNDERPADS AND DIAPERS) ×2 IMPLANT

## 2021-07-29 NOTE — Progress Notes (Signed)
Assisted Dr. Oddono with right, axillary, ultrasound guided block. Side rails up, monitors on throughout procedure. See vital signs in flow sheet. Tolerated Procedure well. 

## 2021-07-29 NOTE — Anesthesia Postprocedure Evaluation (Signed)
Anesthesia Post Note  Patient: KAYANN MAJ  Procedure(s) Performed: RIGHT THUMB TRAPEZIECTOMY AND SUSPENSIONPLASTY (Right) TENDON TRANSFER (Right)     Patient location during evaluation: PACU Anesthesia Type: General Level of consciousness: awake and alert Pain management: pain level controlled Vital Signs Assessment: post-procedure vital signs reviewed and stable Respiratory status: spontaneous breathing, nonlabored ventilation, respiratory function stable and patient connected to nasal cannula oxygen Cardiovascular status: blood pressure returned to baseline and stable Postop Assessment: no apparent nausea or vomiting Anesthetic complications: no   No notable events documented.  Last Vitals:  Vitals:   07/29/21 1315 07/29/21 1400  BP: 112/68 (!) 123/58  Pulse: 89 87  Resp: 17 16  Temp:  36.7 C  SpO2: 95% 96%    Last Pain:  Vitals:   07/29/21 1400  TempSrc:   PainSc: 0-No pain                 Braxton Weisbecker

## 2021-07-29 NOTE — Anesthesia Preprocedure Evaluation (Addendum)
Anesthesia Evaluation  Patient identified by MRN, date of birth, ID band Patient awake    Reviewed: Allergy & Precautions, NPO status , Patient's Chart, lab work & pertinent test results  History of Anesthesia Complications Negative for: history of anesthetic complications  Airway Mallampati: I  TM Distance: >3 FB Neck ROM: Full    Dental  (+) Dental Advisory Given, Teeth Intact Crowns :   Pulmonary former smoker,    Pulmonary exam normal breath sounds clear to auscultation       Cardiovascular Exercise Tolerance: Good Normal cardiovascular exam Rhythm:Regular Rate:Normal     Neuro/Psych  Headaches, PSYCHIATRIC DISORDERS Anxiety Depression  Neuromuscular disease    GI/Hepatic Neg liver ROS, GERD  Medicated and Controlled,  Endo/Other  negative endocrine ROS  Renal/GU negative Renal ROS     Musculoskeletal  (+) Fibromyalgia -  Abdominal   Peds  Hematology negative hematology ROS (+)   Anesthesia Other Findings   Reproductive/Obstetrics                            Anesthesia Physical  Anesthesia Plan  ASA: 2  Anesthesia Plan: General   Post-op Pain Management: Minimal or no pain anticipated   Induction: Intravenous  PONV Risk Score and Plan: Ondansetron, Midazolam, Dexamethasone and Scopolamine patch - Pre-op  Airway Management Planned: LMA  Additional Equipment: None  Intra-op Plan:   Post-operative Plan:   Informed Consent: I have reviewed the patients History and Physical, chart, labs and discussed the procedure including the risks, benefits and alternatives for the proposed anesthesia with the patient or authorized representative who has indicated his/her understanding and acceptance.     Dental advisory given  Plan Discussed with: CRNA and Anesthesiologist  Anesthesia Plan Comments:         Anesthesia Quick Evaluation

## 2021-07-29 NOTE — Anesthesia Procedure Notes (Signed)
Anesthesia Regional Block: Axillary brachial plexus block   Pre-Anesthetic Checklist: , timeout performed,  Correct Patient, Correct Site, Correct Laterality,  Correct Procedure, Correct Position, site marked,  Risks and benefits discussed,  Surgical consent,  Pre-op evaluation,  At surgeon's request and post-op pain management  Laterality: Right  Prep: chloraprep       Needles:  Injection technique: Single-shot  Needle Type: Echogenic Stimulator Needle     Needle Length: 5cm  Needle Gauge: 22     Additional Needles:   Procedures:, nerve stimulator,,, ultrasound used (permanent image in chart),,    Narrative:  Start time: 07/29/2021 10:04 AM End time: 07/29/2021 10:10 AM Injection made incrementally with aspirations every 5 mL.  Performed by: Personally  Anesthesiologist: Bethena Midget, MD  Additional Notes: Functioning IV was confirmed and monitors were applied.  A 40mm 22ga Arrow echogenic stimulator needle was used. Sterile prep and drape,hand hygiene and sterile gloves were used. Ultrasound guidance: relevant anatomy identified, needle position confirmed, local anesthetic spread visualized around nerve(s)., vascular puncture avoided.  Image printed for medical record. Negative aspiration and negative test dose prior to incremental administration of local anesthetic. The patient tolerated the procedure well.

## 2021-07-29 NOTE — Anesthesia Procedure Notes (Signed)
Procedure Name: LMA Insertion Date/Time: 07/29/2021 11:36 AM  Performed by: Karen Kitchens, CRNAPre-anesthesia Checklist: Patient identified, Emergency Drugs available, Suction available and Patient being monitored Patient Re-evaluated:Patient Re-evaluated prior to induction Oxygen Delivery Method: Circle system utilized Preoxygenation: Pre-oxygenation with 100% oxygen Induction Type: IV induction Ventilation: Mask ventilation without difficulty LMA: LMA inserted LMA Size: 4.0 Number of attempts: 1 Airway Equipment and Method: Bite block Placement Confirmation: positive ETCO2, CO2 detector and breath sounds checked- equal and bilateral Tube secured with: Tape Dental Injury: Teeth and Oropharynx as per pre-operative assessment

## 2021-07-29 NOTE — Op Note (Signed)
I assisted Surgeon(s) and Role:    * Betha Loa, MD - Primary    Cindee Salt, MD - Assisting on the Procedure(s): RIGHT THUMB TRAPEZIECTOMY AND SUSPENSIONPLASTY TENDON TRANSFER on 07/29/2021.  I provided assistance on this case as follows: Set up, approach, identification the trapezium, and application a protection of the radial artery, isolation and removal of the trapezium, harvesting of the flexor carpi radialis tendon, placement of the microlink suture, transfer of the flexor carpi radialis tendon or suspension, closure of the wound and application of the dressing and splints  Electronically signed by: Cindee Salt, MD Date: 07/29/2021 Time: 12:40 PM

## 2021-07-29 NOTE — Discharge Instructions (Addendum)

## 2021-07-29 NOTE — Transfer of Care (Signed)
Immediate Anesthesia Transfer of Care Note  Patient: Sheri Wood  Procedure(s) Performed: RIGHT THUMB TRAPEZIECTOMY AND SUSPENSIONPLASTY (Right) TENDON TRANSFER (Right)  Patient Location: PACU  Anesthesia Type:General and Regional  Level of Consciousness: awake, alert  and oriented  Airway & Oxygen Therapy: Patient Spontanous Breathing and Patient connected to face mask oxygen  Post-op Assessment: Report given to RN and Post -op Vital signs reviewed and stable  Post vital signs: Reviewed and stable  Last Vitals:  Vitals Value Taken Time  BP    Temp    Pulse 97 07/29/21 1245  Resp    SpO2 96 % 07/29/21 1245  Vitals shown include unvalidated device data.  Last Pain:  Vitals:   07/29/21 1012  TempSrc:   PainSc: 0-No pain      Patients Stated Pain Goal: 5 (07/29/21 0914)  Complications: No notable events documented.

## 2021-07-29 NOTE — H&P (Signed)
Sheri Wood is an 51 y.o. female.   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  She has tried non operative measures without lasting relief.  She wishes to have right thumb trapeziectomy and suspensionplasty.  Allergies:  Allergies  Allergen Reactions   Ciprofloxacin Other (See Comments)    Severe muscle and joint pain   Cefzil [Cefprozil]     possible hives   Augmentin [Amoxicillin-Pot Clavulanate] Nausea And Vomiting    Throat felt funny and gave pt anxiety   Bee Venom Swelling    Past Medical History:  Diagnosis Date   Allergy    seasonal   Anxiety    Depression    Fibromyalgia    Gastritis    history   GERD (gastroesophageal reflux disease)    otc med prn   IBS (irritable bowel syndrome)    Migraine    SVD (spontaneous vaginal delivery)    x 2   Urticaria, chronic    Vertigo     Past Surgical History:  Procedure Laterality Date   CHOLECYSTECTOMY N/A 04/08/2020   Procedure: LAPAROSCOPIC CHOLECYSTECTOMY;  Surgeon: [name], MD;  Location: [facility];  Service: General;  Laterality: N/A;   COLONOSCOPY  2008   negative   DIAGNOSTIC LAPAROSCOPY     fibroids   DILATION AND CURETTAGE OF UTERUS     DILITATION & CURRETTAGE/HYSTROSCOPY WITH NOVASURE ABLATION N/A 05/06/2013   Procedure: DILATATION & CURETTAGE/HYSTEROSCOPY WITH NOVASURE ABLATION;  Surgeon: Alphonsus Sias. Ernestina Penna, MD;  Location: [facility];  Service: Gynecology;  Laterality: N/A;   EYE SURGERY     bilateral lasik   FOOT SURGERY  2017   LAPAROSCOPIC BILATERAL SALPINGECTOMY Bilateral 05/06/2013   Procedure: LAPAROSCOPIC BILATERAL SALPINGECTOMY;  Surgeon: Tresa Endo A. Ernestina Penna, MD;  Location: [facility];  Service: Gynecology;  Laterality: Bilateral;   WISDOM TOOTH EXTRACTION      Family History: Family History  Problem Relation Age of Onset   Alcohol abuse Brother    Drug abuse Brother    Depression Mother    Anxiety disorder Mother    Anxiety disorder Brother     Depression Brother    Bipolar disorder Maternal Uncle    Anxiety disorder Paternal Grandfather    Depression Paternal Grandfather    Bipolar disorder Cousin    Breast cancer Maternal Aunt     Social History:   reports that she quit smoking about 33 years ago. Her smoking use included cigarettes. She has a 7.50 pack-year smoking history. She has never used smokeless tobacco. She reports that she does not drink alcohol and does not use drugs.  Medications: Medications Prior to Admission  Medication Sig Dispense Refill   buPROPion (WELLBUTRIN SR) 150 MG 12 hr tablet Take 1 tablet (150 mg total) by mouth 2 (two) times daily. 60 tablet 5   eletriptan (RELPAX) 40 MG tablet Take 1 tablet (40 mg total) by mouth every 2 (two) hours as needed for migraine (May repeat in 2 hours if headache persists or recurs.). 10 tablet 0   fluconazole (DIFLUCAN) 150 MG tablet Take 1 tablet (150 mg total) by mouth every 3 (three) days as needed. 2 tablet 0   FLUoxetine (PROZAC) 10 MG capsule Take 1 capsule (10 mg total) by mouth daily. 30 capsule 2   lisdexamfetamine (VYVANSE) 60 MG capsule Take 1 capsule (60 mg total) by mouth every morning. 30 capsule 0   pramipexole (MIRAPEX) 1.5 MG tablet Take 1 tablet (1.5 mg total)  by mouth at bedtime. 30 tablet 5   SPRINTEC 28 0.25-35 MG-MCG tablet Take 1 tablet by mouth at bedtime.     diazepam (VALIUM) 5 MG tablet Take 1 tablet (5 mg total) by mouth daily as needed for anxiety. 30 tablet 2   lisdexamfetamine (VYVANSE) 60 MG capsule Take 1 capsule (60 mg total) by mouth every morning. 30 capsule 0   lisdexamfetamine (VYVANSE) 60 MG capsule Take 1 capsule (60 mg total) by mouth every morning. 30 capsule 0    Results for orders placed or performed during the hospital encounter of 07/29/21 (from the past 48 hour(s))  Pregnancy, urine POC     Status: None   Collection Time: 07/29/21  8:52 AM  Result Value Ref Range   Preg Test, Ur NEGATIVE NEGATIVE    Comment:        THE  SENSITIVITY OF THIS METHODOLOGY IS >24 mIU/mL     No results found.    Blood pressure 117/71, pulse 70, temperature 99 F (37.2 C), temperature source Oral, resp. rate 16, height 5\' 7"  (1.702 m), weight 91.4 kg, SpO2 100 %.  General appearance: alert, cooperative, and appears stated age Head: Normocephalic, without obvious abnormality, atraumatic Neck: supple, symmetrical, trachea midline Extremities: Intact sensation and capillary refill all digits.  +epl/fpl/io.  No wounds.  Pulses: 2+ and symmetric Skin: Skin color, texture, turgor normal. No rashes or lesions Neurologic: Grossly normal Incision/Wound: none  Assessment/Plan Right thumb cmc arthritis.  Non operative and operative treatment options have been discussed with the patient and patient wishes to proceed with operative treatment. Risks, benefits, and alternatives of surgery have been discussed and the patient agrees with the plan of care.   07/29/2021, 9:41 AM

## 2021-07-29 NOTE — Op Note (Signed)
NAME: Sheri Wood MEDICAL RECORD NO: [id number] DATE OF BIRTH: 11-20-1970 FACILITY: [facility] LOCATION: [facility] PHYSICIAN: Tami Ribas, MD   OPERATIVE REPORT   DATE OF PROCEDURE: 07/29/21    PREOPERATIVE DIAGNOSIS: Right thumb CMC osteoarthritis   POSTOPERATIVE DIAGNOSIS: Right thumb CMC osteoarthritis   PROCEDURE: 1.  Right thumb trapeziectomy 2.  Right thumb suspensioplasty   SURGEON:  Betha Loa, M.D.   ASSISTANT: Cindee Salt, MD   ANESTHESIA:  General with regional   INTRAVENOUS FLUIDS:  Per anesthesia flow sheet.   ESTIMATED BLOOD LOSS:  Minimal.   COMPLICATIONS:  None.   SPECIMENS:  none   TOURNIQUET TIME:    Total Tourniquet Time Documented: Upper Arm (Right) -65 minutes Total: Upper Arm (Right) -65 minutes    DISPOSITION:  Stable to PACU.   INDICATIONS: 51 year old female with right thumb CMC osteoarthritis.  She has tried nonoperative measures without adequate relief.  She wishes to proceed with trapeziectomy and suspensioplasty.  Risks, benefits and alternatives of surgery were discussed including the risks of blood loss, infection, damage to nerves, vessels, tendons, ligaments, bone for surgery, need for additional surgery, complications with wound healing, continued pain, stiffness.  She voiced understanding of these risks and elected to proceed.  OPERATIVE COURSE:  After being identified preoperatively by myself,  the patient and I agreed on the procedure and site of the procedure.  The surgical site was marked.  Surgical consent had been signed. Preoperative IV antibiotic prophylaxis was given. She was transferred to the operating room and placed on the operating table in supine position with the right upper extremity on an arm board.  General anesthesia was induced by the anesthesiologist. A regional block had been performed by anesthesia in preoperative holding.    Right upper extremity was prepped and draped in normal sterile  orthopedic fashion.  A surgical pause was performed between the surgeons, anesthesia, and operating room staff and all were in agreement as to the patient, procedure, and site of procedure.  Tourniquet at the proximal aspect of the extremity was inflated to 250 mmHg after exsanguination of the arm with an Esmarch bandage.  Incision was made at the dorsum of the [facility] joint of the right thumb.  This was carried in subcutaneous tissues by spreading technique.  Bipolar electrocautery was used to obtain hemostasis.  The dorsal cutaneous branch of nerve was identified and protected throughout the case.  The capsule was sharply incised.  The radial artery had been identified and was protected throughout the case.  The trapezium was identified.  This was confirmed with C arm.  The trapezium was carefully freed up of soft tissue attachments using the [facility] blade.  The trapezium was then removed with the rongeurs.  There was an osteophyte between the index and thumb metacarpals which was also removed.  Good removal of the trapezium was achieved.  The FCR tendon was identified.  Placed under traction.  One half of the tendon was harvested creating a distally based tendon graft.  The microlink suture was used.  The guidepin was placed through the base of the thumb metacarpal and across the index finger metacarpal.  This was confirmed on C arm.  An incision was made at the dorsum of the hand to retrieve the guidepin.  The microlink suture anchor was placed.  The anchor was tied over the index metacarpal while creating a space between the index and thumb metacarpals with a hemostat.  The FCR tendon graft was then  passed around a portion of the APL tendon and sutured back to itself.  It was then sutured to the APL tendon as well to provide the suspensioplasty.  The wound was copiously irrigated with sterile saline.  C-arm was used in AP lateral oblique projections to ensure appropriate suspension of the thumb metacarpal which was  the case.  The capsule was repaired as best possible using a 4-0 Vicryl suture.  Inverted interrupted Vicryl sutures were placed in subcutaneous tissues and skin was closed with 4-0 nylon in a horizontal mattress fashion.  The wounds were dressed with sterile Xeroform 4 x 4's and wrapped with a Kerlix bandage.  Spica splint was placed and wrapped with Kerlix and Ace bandage.  The tourniquet was deflated at 65 minutes.  Fingertips were pink with brisk capillary refill after deflation of tourniquet.  The operative  drapes were broken down.  The patient was awoken from anesthesia safely.  She was transferred back to the stretcher and taken to PACU in stable condition.  I will see her back in the office in 1 week for postoperative followup.  I will give her a prescription for Norco 5/325 1-2 tabs PO q6 hours prn pain, dispense # 25.   Betha Loa, MD Electronically signed, 07/29/21

## 2021-07-30 ENCOUNTER — Encounter (HOSPITAL_BASED_OUTPATIENT_CLINIC_OR_DEPARTMENT_OTHER): Payer: Self-pay | Admitting: Orthopedic Surgery

## 2021-09-06 ENCOUNTER — Encounter: Payer: Self-pay | Admitting: Gastroenterology

## 2021-09-13 IMAGING — MG MM DIGITAL SCREENING BILAT W/ TOMO AND CAD
8 series · 9 of 24 positions shown · non-contrast
Comparison: None.

CLINICAL DATA: Screening.

EXAM:
DIGITAL SCREENING BILATERAL MAMMOGRAM WITH TOMOSYNTHESIS AND CAD
TECHNIQUE: Bilateral screening digital craniocaudal and mediolateral oblique
mammograms were obtained. Bilateral screening digital breast
tomosynthesis was performed. The images were evaluated with
computer-aided detection.

[R CC synth-2D]
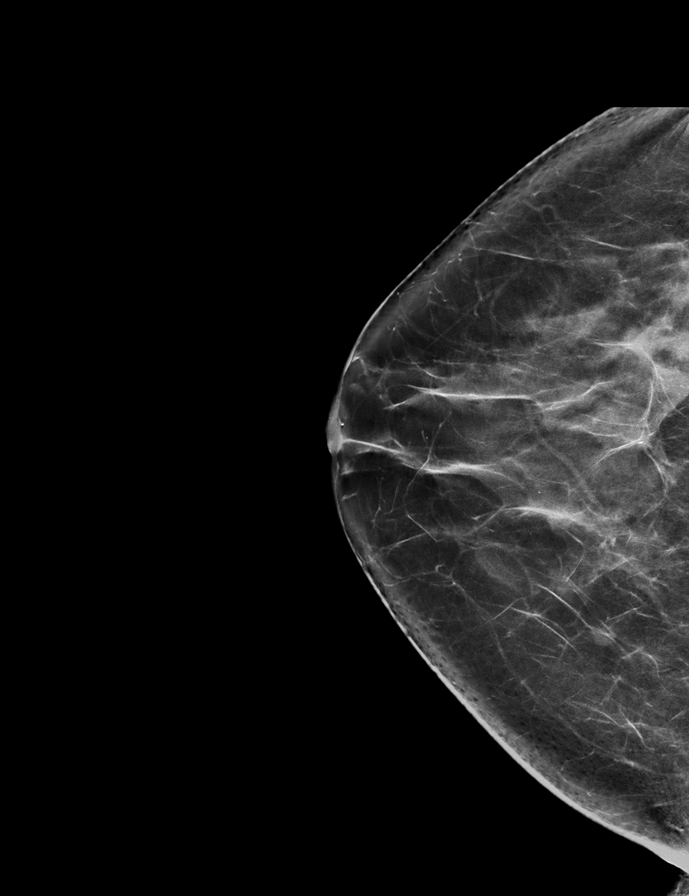

[L CC synth-2D]
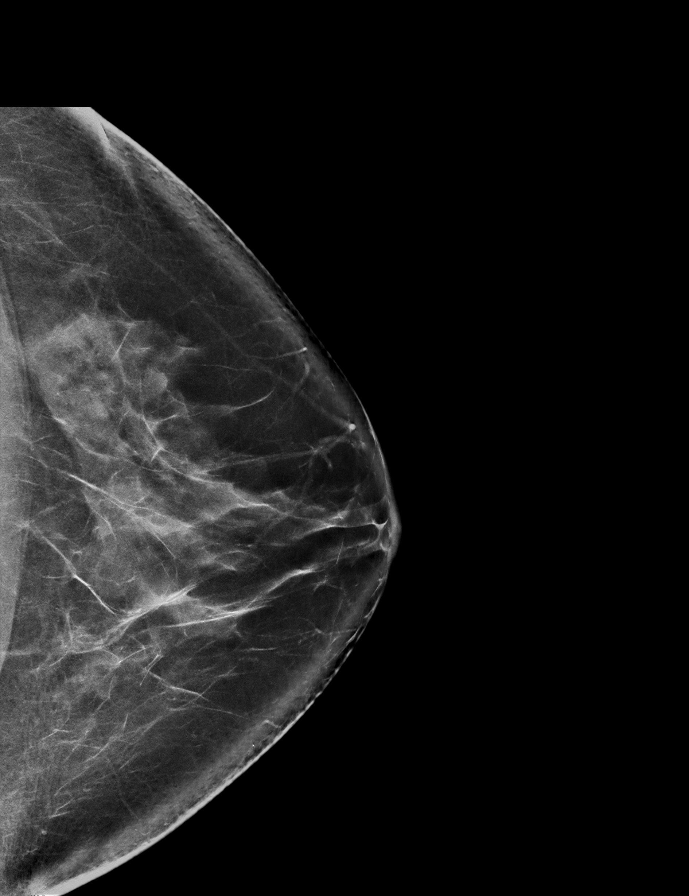

[L MLO synth-2D]
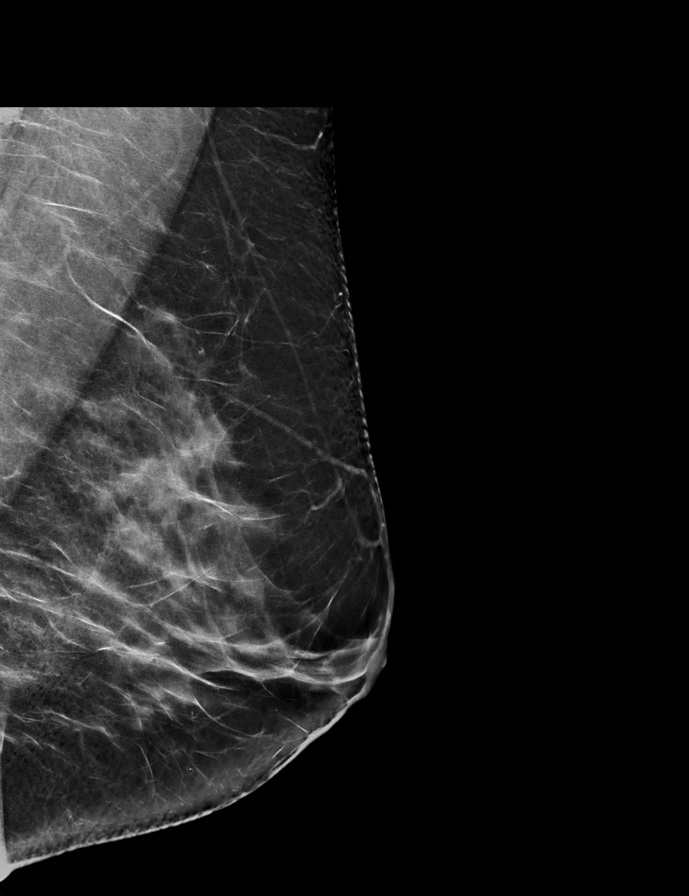

[R MLO synth-2D]
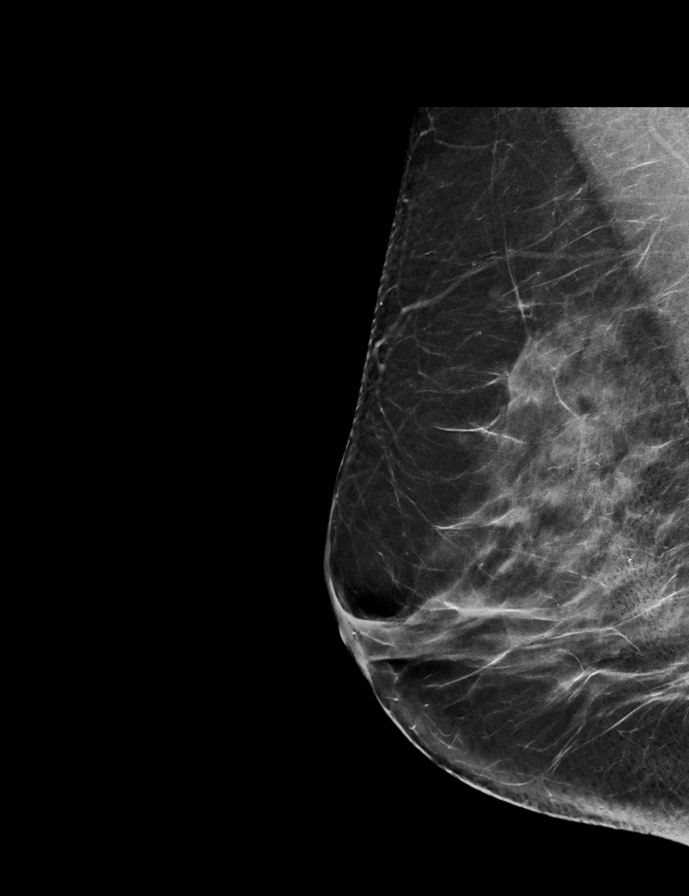

[L CC tomo · 2 of 86 frames shown]
[frame 28/86]
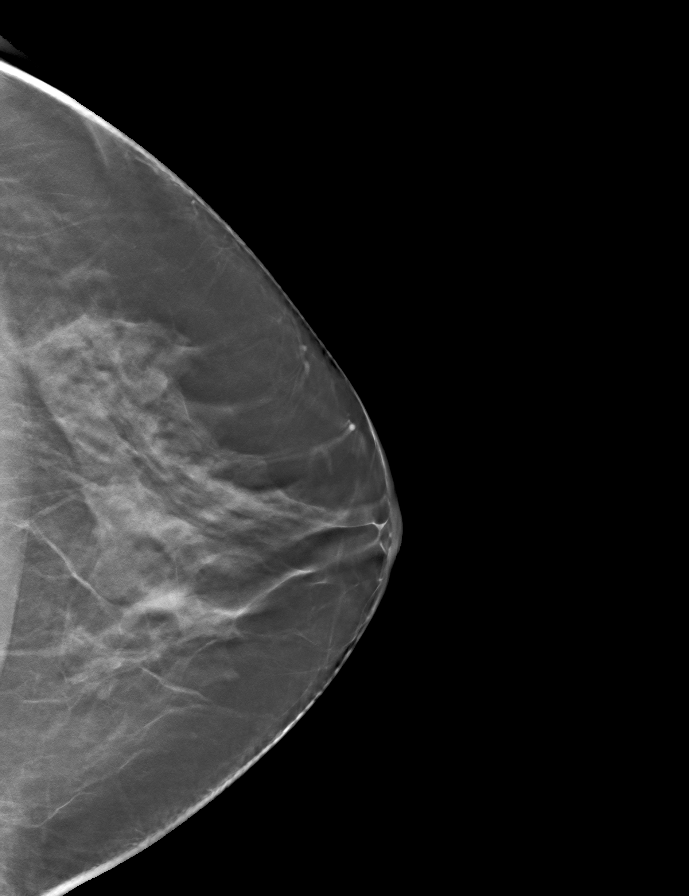
[frame 43/86]
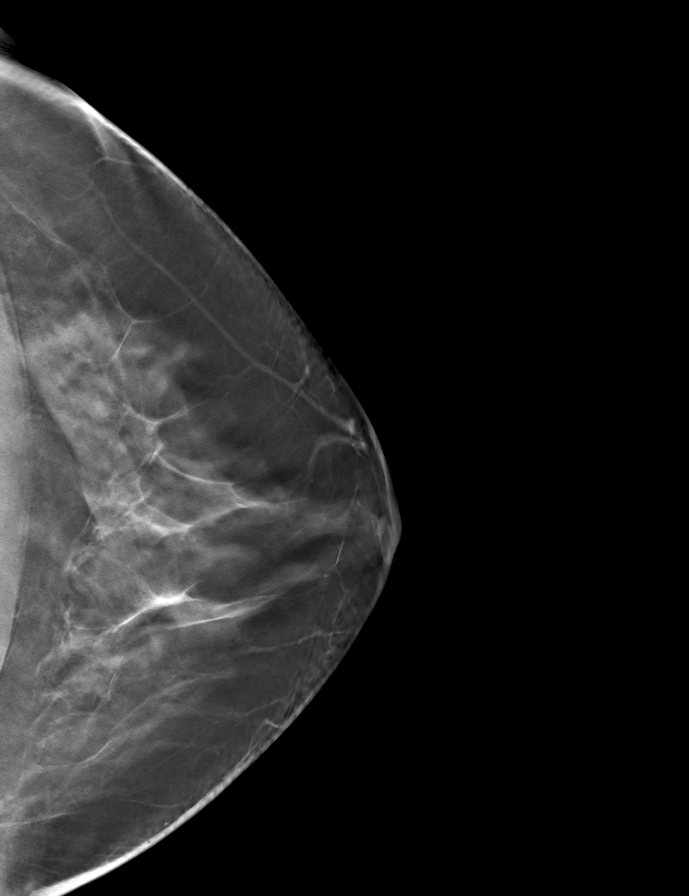

[L MLO tomo · tomo slice 39/78.0]
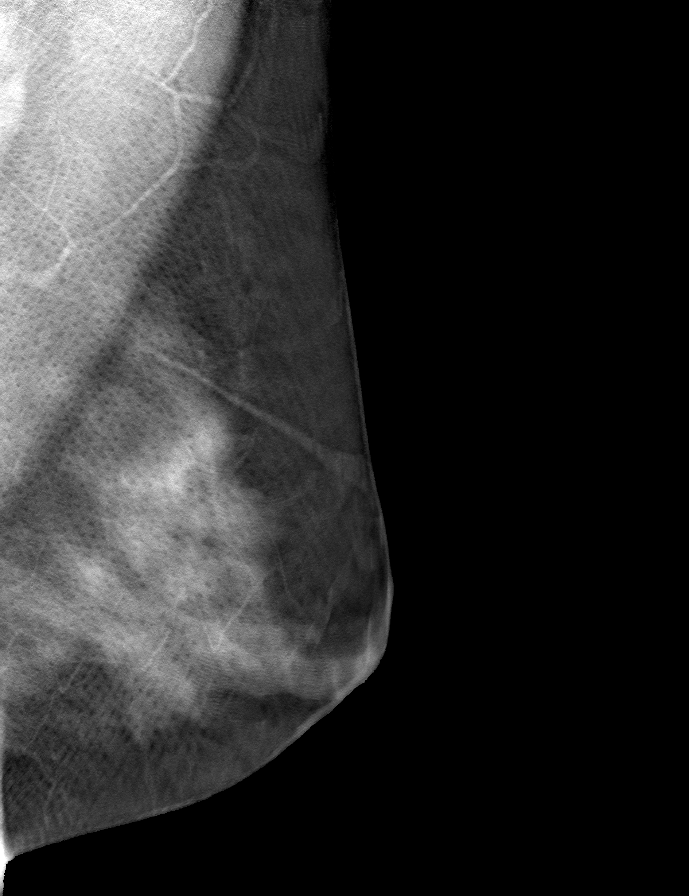

[R CC tomo · tomo slice 37/73.0]
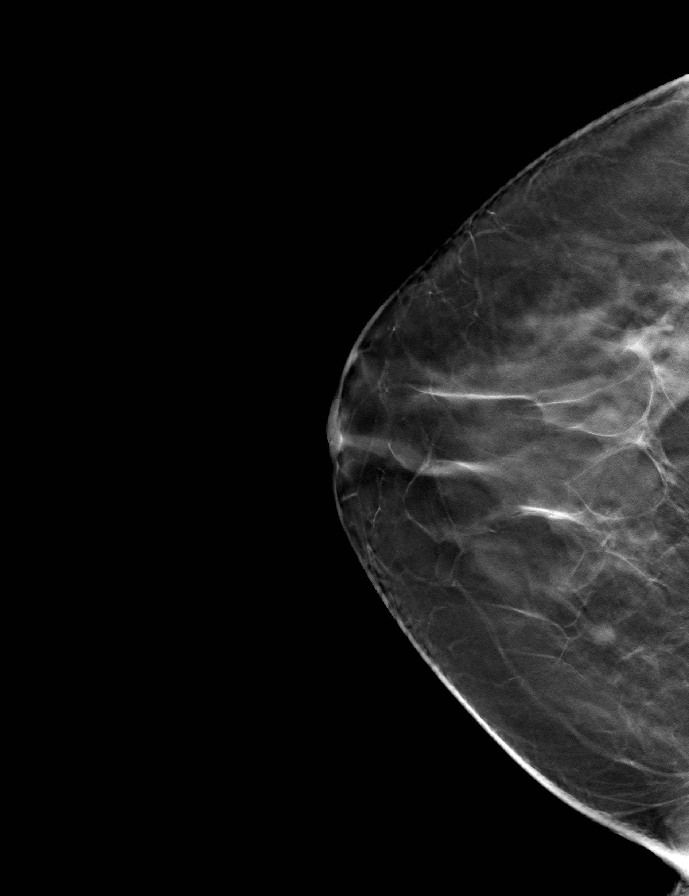

[R MLO tomo · tomo slice 39/76.0]
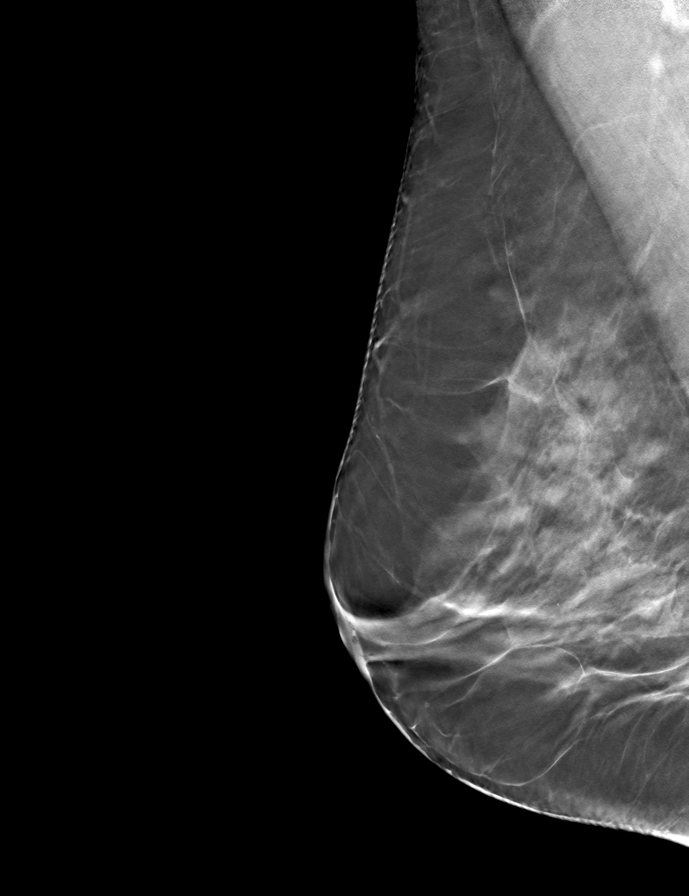

[9 of 24 positions shown; findings below may reference images not displayed]

ACR Breast Density Category c: The breast tissue is heterogeneously
dense, which may obscure small masses.
FINDINGS: In the right breast, a possible mass warrants further evaluation. In
the left breast, no findings suspicious for malignancy.
IMPRESSION: Further evaluation is suggested for possible mass in the right
breast.

RECOMMENDATION:
Ultrasound of the right breast. (Code:[id number])

The patient will be contacted regarding the findings, and additional
imaging will be scheduled.

BI-RADS CATEGORY  0: Incomplete. Need additional imaging evaluation
and/or prior mammograms for comparison.

## 2021-09-21 IMAGING — US US BREAST*R* LIMITED INC AXILLA
1 series · 12 of 13 positions shown · non-contrast
Comparison: Screening mammogram dated 06/03/2020.
COMPARISON: Screening mammogram dated 06/03/2020.

Addendum:
CLINICAL DATA: Screening recall for an oval circumscribed mass in
the inner right breast.

EXAM:
ULTRASOUND OF THE RIGHT BREAST

[Series 1: us breast ltd uni right inc axilla · 12 of 13 slices shown]
[im 1/13]
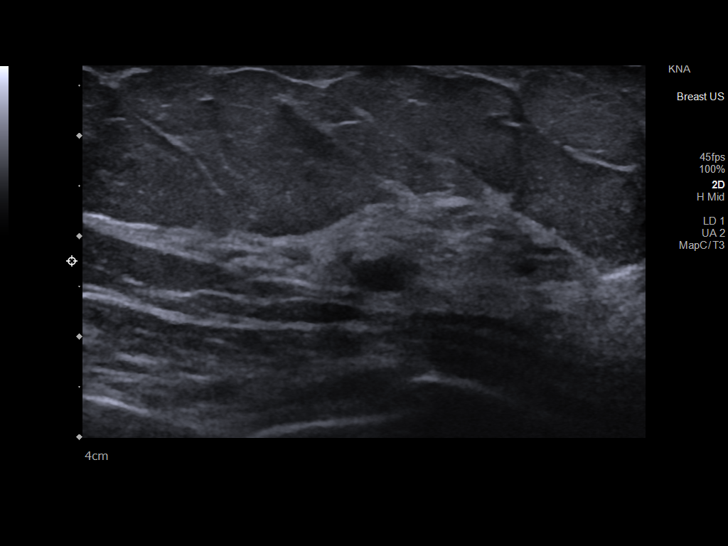
[im 2/13]
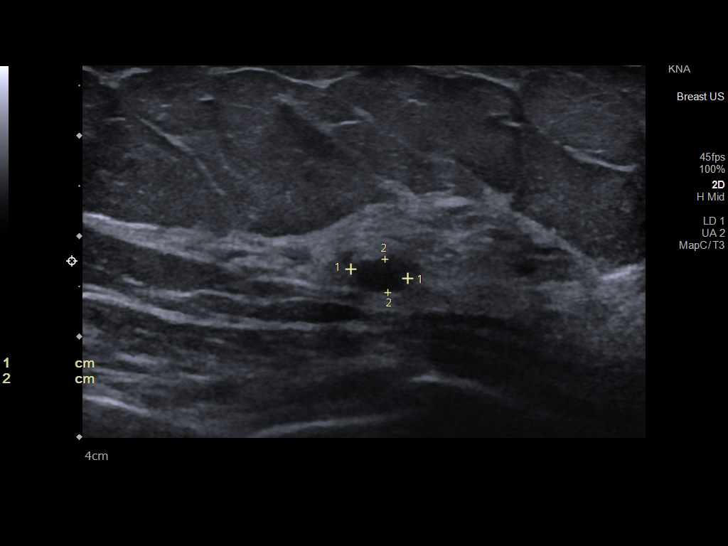
[im 3/13]
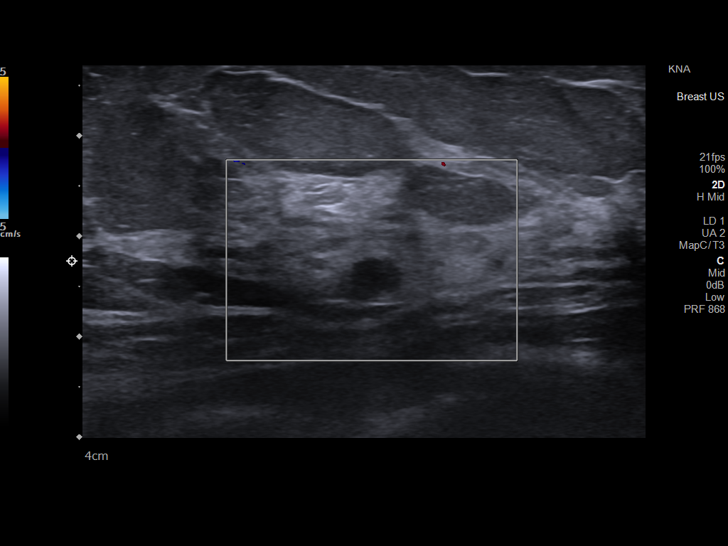
[im 4/13]
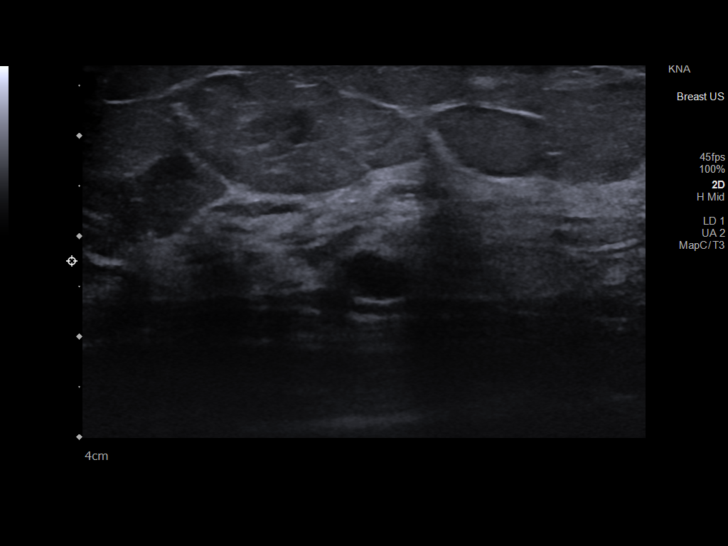
[im 5/13]
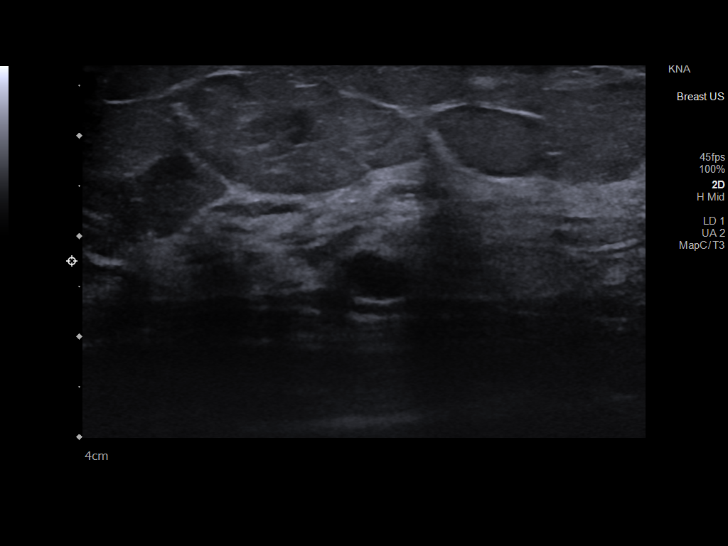
[im 6/13]
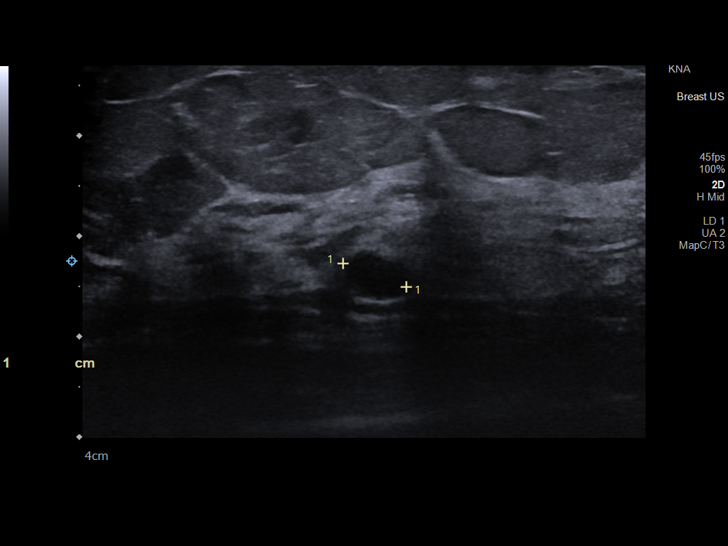
[im 8/13]
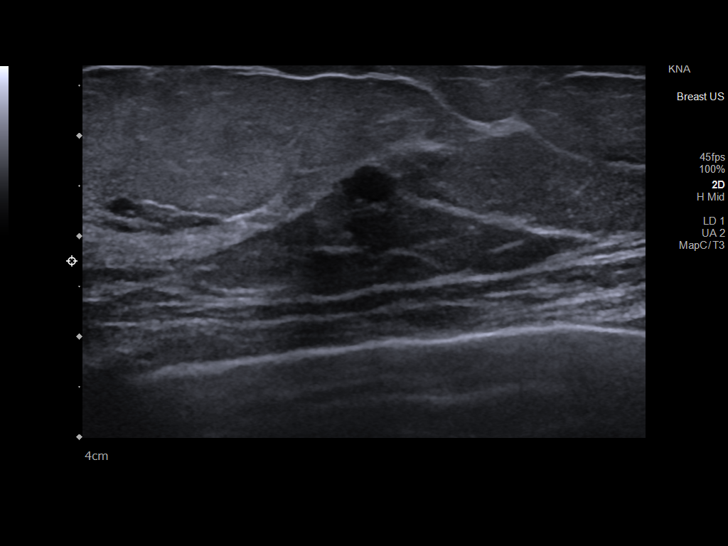
[im 9/13]
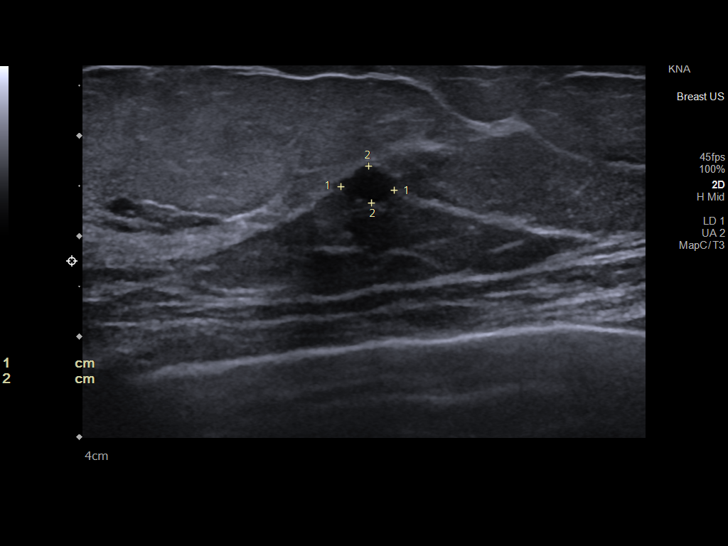
[im 10/13]
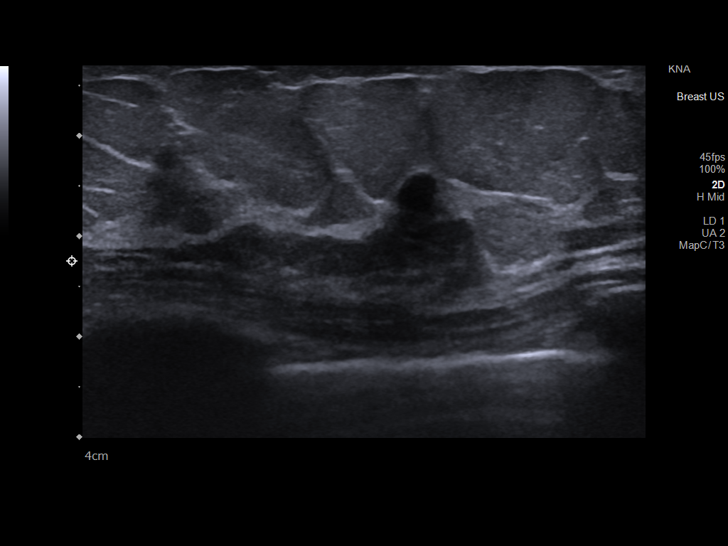
[im 11/13]
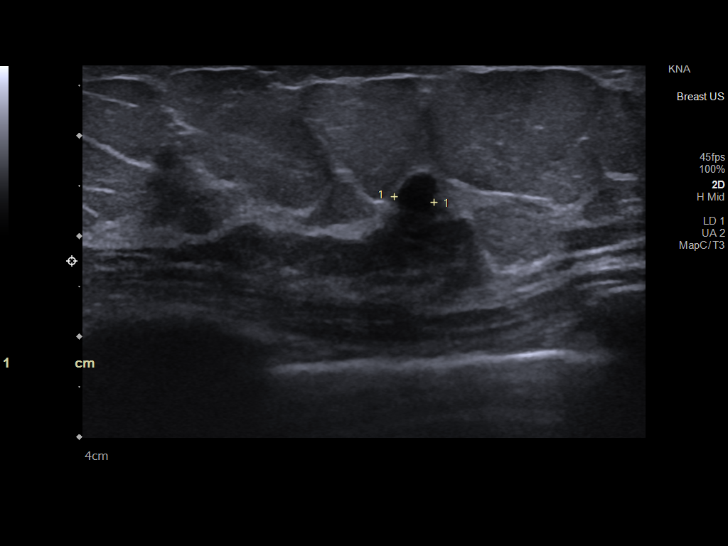
[im 12/13]
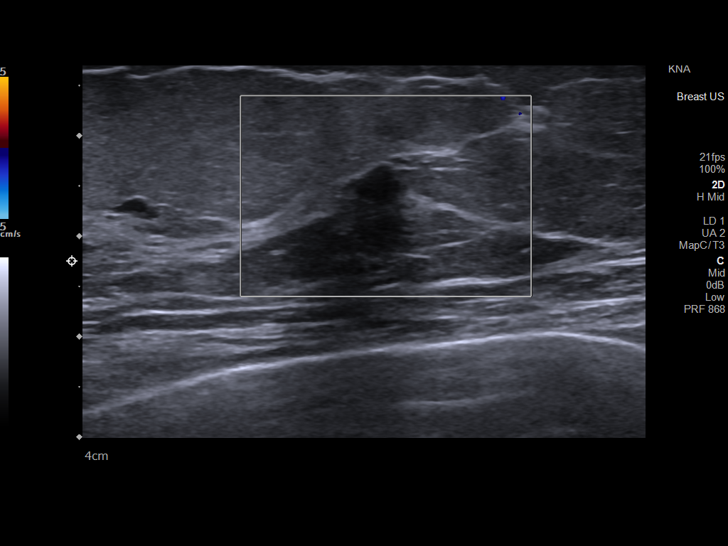
[im 13/13]
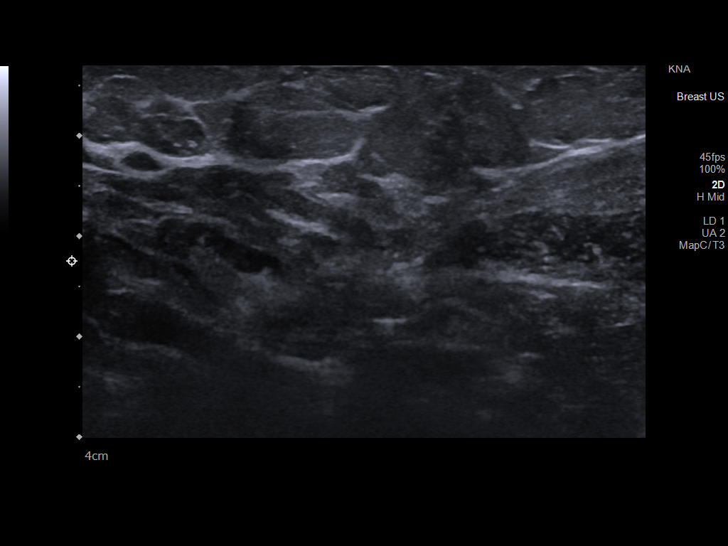

[12 of 13 positions shown; findings below may reference images not displayed]

FINDINGS: Targeted ultrasound of the right breast was performed. Numerous
small cysts and clusters of cysts are identified throughout the
upper and inner right breast. A representative cyst at 11 o'clock 4
cm from nipple posterior depth measures 0.6 x 0.3 x 0.7 cm. There is
an oval hypoechoic mass with slight margin irregularity at 3 o'clock
4 cm from nipple measuring 0.5 x 0.4 x 0.4 cm. This is felt to
correspond well with the mass seen in the inner right breast at
mammography. No lymphadenopathy seen in the right axilla.
IMPRESSION: Indeterminate 0.5 cm mass in the right breast at the 3 o'clock
position. This may represent a cyst however cannot be clearly
characterized as such on today's exam.

RECOMMENDATION:
Recommend attempt at ultrasound-guided aspiration of the mass in the
right breast at the 3 o'clock position. If this does not aspirate,
then biopsy is recommended.

I have discussed the findings and recommendations with the patient.
If applicable, a reminder letter will be sent to the patient
regarding the next appointment.

BI-RADS CATEGORY  4: Suspicious.

ADDENDUM:
[name] prior outside imaging dated 05/02/2017 and 9155 has
become available for comparison. When compared to the prior exams
dating back to 8263 the mass in the inner right breast is now
slightly smaller in size. A prior ultrasound was also performed of
the mass 10/20/2014 which demonstrates this mass to be clearly a
benign cyst.

Given stable mammographic and sonographic findings, the scheduled
biopsy can be canceled and the patient can return to annual
screening mammography.

BI-RADS category:  2: Benign.

*** End of Addendum ***
FINDINGS: Targeted ultrasound of the right breast was performed. Numerous
small cysts and clusters of cysts are identified throughout the
upper and inner right breast. A representative cyst at 11 o'clock 4
cm from nipple posterior depth measures 0.6 x 0.3 x 0.7 cm. There is
an oval hypoechoic mass with slight margin irregularity at 3 o'clock
4 cm from nipple measuring 0.5 x 0.4 x 0.4 cm. This is felt to
correspond well with the mass seen in the inner right breast at
mammography. No lymphadenopathy seen in the right axilla.
IMPRESSION: Indeterminate 0.5 cm mass in the right breast at the 3 o'clock
position. This may represent a cyst however cannot be clearly
characterized as such on today's exam.

RECOMMENDATION:
Recommend attempt at ultrasound-guided aspiration of the mass in the
right breast at the 3 o'clock position. If this does not aspirate,
then biopsy is recommended.

I have discussed the findings and recommendations with the patient.
If applicable, a reminder letter will be sent to the patient
regarding the next appointment.

BI-RADS CATEGORY  4: Suspicious.

## 2021-10-06 ENCOUNTER — Ambulatory Visit: Payer: BC Managed Care – PPO | Admitting: Gastroenterology

## 2021-11-15 ENCOUNTER — Other Ambulatory Visit (HOSPITAL_COMMUNITY): Payer: Self-pay | Admitting: Psychiatry

## 2021-11-15 NOTE — Telephone Encounter (Signed)
Call for appt

## 2021-11-16 ENCOUNTER — Other Ambulatory Visit (HOSPITAL_COMMUNITY): Payer: Self-pay | Admitting: Psychiatry

## 2021-11-16 NOTE — Telephone Encounter (Signed)
Call for appt

## 2021-11-30 NOTE — Telephone Encounter (Signed)
Scheduled 12/06/21

## 2021-12-05 ENCOUNTER — Telehealth: Payer: BC Managed Care – PPO | Admitting: Family

## 2021-12-05 DIAGNOSIS — J019 Acute sinusitis, unspecified: Secondary | ICD-10-CM

## 2021-12-05 MED ORDER — DOXYCYCLINE HYCLATE 100 MG PO TABS: 100.0000 mg | ORAL_TABLET | Freq: Two times a day (BID) | ORAL | 0 refills | Status: DC

## 2021-12-05 NOTE — Progress Notes (Signed)

## 2021-12-06 ENCOUNTER — Telehealth (HOSPITAL_COMMUNITY): Payer: BC Managed Care – PPO | Admitting: Psychiatry

## 2021-12-06 ENCOUNTER — Encounter (HOSPITAL_COMMUNITY): Payer: Self-pay | Admitting: Psychiatry

## 2021-12-06 DIAGNOSIS — F418 Other specified anxiety disorders: Secondary | ICD-10-CM

## 2021-12-06 DIAGNOSIS — F900 Attention-deficit hyperactivity disorder, predominantly inattentive type: Secondary | ICD-10-CM | POA: Diagnosis not present

## 2021-12-06 DIAGNOSIS — F5081 Binge eating disorder: Secondary | ICD-10-CM | POA: Diagnosis not present

## 2021-12-06 MED ORDER — LISDEXAMFETAMINE DIMESYLATE 60 MG PO CAPS: ORAL_CAPSULE | ORAL | 0 refills | Status: DC

## 2021-12-06 MED ORDER — BUPROPION HCL ER (SR) 150 MG PO TB12: 150.0000 mg | ORAL_TABLET | Freq: Two times a day (BID) | ORAL | 5 refills | Status: DC

## 2021-12-06 MED ORDER — FLUOXETINE HCL 10 MG PO CAPS
10.0000 mg | ORAL_CAPSULE | Freq: Every day | ORAL | 0 refills | Status: DC
Start: 2021-12-06 — End: 2022-01-11

## 2021-12-06 MED ORDER — DIAZEPAM 5 MG PO TABS: 5.0000 mg | ORAL_TABLET | Freq: Every day | ORAL | 0 refills | Status: DC | PRN

## 2021-12-06 MED ORDER — LISDEXAMFETAMINE DIMESYLATE 60 MG PO CAPS: 60.0000 mg | ORAL_CAPSULE | ORAL | 0 refills | Status: DC

## 2021-12-06 MED ORDER — LISDEXAMFETAMINE DIMESYLATE 60 MG PO CAPS
60.0000 mg | ORAL_CAPSULE | ORAL | 0 refills | Status: DC
Start: 2021-12-06 — End: 2022-01-11

## 2021-12-06 NOTE — Progress Notes (Signed)
Virtual Visit via Video Note  I connected with Sheri Wood on 12/06/21 at  8:40 AM EDT by a video enabled telemedicine application and verified that I am speaking with the correct person using two identifiers.  Location: Patient: home Provider: office   I discussed the limitations of evaluation and management by telemedicine and the availability of in person appointments. The patient expressed understanding and agreed to proceed.      I discussed the assessment and treatment plan with the patient. The patient was provided an opportunity to ask questions and all were answered. The patient agreed with the plan and demonstrated an understanding of the instructions.   The patient was advised to call back or seek an in-person evaluation if the symptoms worsen or if the condition fails to improve as anticipated.  I provided 15 minutes of non-face-to-face time during this encounter.   Diannia Ruder, MD  [facility] MD/PA/NP OP Progress Note  12/06/2021 8:58 AM Sheri Wood  MRN:  [id number]  Chief Complaint:  Chief Complaint  Patient presents with   ADD   Anxiety   Follow-up   HPI: This patient is a 51 year old married white female who lives with her husband in [place].  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  She is think she is doing fairly well although still has some issues.  She has a good deal of pain from fibromyalgia and seronegative arthritis.  She stopped diclofenac because she was worried about side effects.  I urged her to talk to her PCP about this.  The Prozac in addition to Wellbutrin seems to be helping some of her obsessional symptoms.  The Vyvanse is helping her binge eating disorder and focus.  She occasionally takes Valium for anxiety.  She denies significant depression thoughts of self-harm or suicide.  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  Her sleep is interrupted by hot flashes and I urged her to talk to her OB/GYN about this. Visit Diagnosis:    ICD-10-CM   1. Attention deficit hyperactivity disorder (ADHD), predominantly inattentive type  F90.0     2. Depression with anxiety  F41.8     3. Binge eating disorder  F50.81       Past Psychiatric History: Long-term outpatient treatment for depression and anxiety  Past Medical History:  Past Medical History:  Diagnosis Date   Allergy    seasonal   Anxiety    Depression    Fibromyalgia    Gastritis    history   GERD (gastroesophageal reflux disease)    otc med prn   IBS (irritable bowel syndrome)    Migraine    SVD (spontaneous vaginal delivery)    x 2   Urticaria, chronic    Vertigo     Past Surgical History:  Procedure Laterality Date   CARPOMETACARPEL SUSPENSION PLASTY Right 07/29/2021   Procedure: RIGHT THUMB TRAPEZIECTOMY AND SUSPENSIONPLASTY;  Surgeon: Betha Loa, MD;  Location: [facility];  Service: Orthopedics;  Laterality: Right;  75 MIN   CHOLECYSTECTOMY N/A 04/08/2020   Procedure: LAPAROSCOPIC CHOLECYSTECTOMY;  Surgeon: [name], MD;  Location: [facility];  Service: General;  Laterality: N/A;   COLONOSCOPY  2008   negative   DIAGNOSTIC LAPAROSCOPY     fibroids   DILATION  AND CURETTAGE OF UTERUS     DILITATION & CURRETTAGE/HYSTROSCOPY WITH NOVASURE ABLATION N/A 05/06/2013   Procedure: DILATATION & CURETTAGE/HYSTEROSCOPY WITH NOVASURE ABLATION;  Surgeon: Alphonsus Sias. Ernestina Penna, MD;  Location: [facility];  Service: Gynecology;  Laterality: N/A;   EYE SURGERY     bilateral lasik   FOOT SURGERY  2017   LAPAROSCOPIC BILATERAL SALPINGECTOMY Bilateral 05/06/2013   Procedure: LAPAROSCOPIC BILATERAL SALPINGECTOMY;  Surgeon: Tresa Endo A. Ernestina Penna, MD;  Location: [facility];  Service: Gynecology;  Laterality: Bilateral;   TENDON TRANSFER Right 07/29/2021   Procedure: TENDON  TRANSFER;  Surgeon: Betha Loa, MD;  Location: [facility];  Service: Orthopedics;  Laterality: Right;   WISDOM TOOTH EXTRACTION      Family Psychiatric History: See below  Family History:  Family History  Problem Relation Age of Onset   Alcohol abuse Brother    Drug abuse Brother    Depression Mother    Anxiety disorder Mother    Anxiety disorder Brother    Depression Brother    Bipolar disorder Maternal Uncle    Anxiety disorder Paternal Grandfather    Depression Paternal Grandfather    Bipolar disorder Cousin    Breast cancer Maternal Aunt     Social History:  Social History   Socioeconomic History   Marital status: Married    Spouse name: Not on file   Number of children: Not on file   Years of education: Not on file   Highest education level: Not on file  Occupational History   Not on file  Tobacco Use   Smoking status: Former    Packs/day: 0.75    Years: 10.00    Total pack years: 7.50    Types: Cigarettes    Quit date: 02/08/1988    Years since quitting: 33.8   Smokeless tobacco: Never  Vaping Use   Vaping Use: Former  Substance and Sexual Activity   Alcohol use: No   Drug use: No   Sexual activity: Yes    Birth control/protection: None  Other Topics Concern   Not on file  Social History Narrative   Not on file   Social Determinants of Health   Financial Resource Strain: Not on file  Food Insecurity: Not on file  Transportation Needs: Not on file  Physical Activity: Not on file  Stress: Not on file  Social Connections: Not on file    Allergies:  Allergies  Allergen Reactions   Ciprofloxacin Other (See Comments)    Severe muscle and joint pain   Cefzil [Cefprozil]     possible hives   Augmentin [Amoxicillin-Pot Clavulanate] Nausea And Vomiting    Throat felt funny and gave pt anxiety   Bee Venom Swelling    Metabolic Disorder Labs: No results found for: "HGBA1C", "MPG" No results found for: "PROLACTIN" Lab Results   Component Value Date   CHOL 192 04/02/2015   TRIG 69 04/02/2015   HDL 60 04/02/2015   CHOLHDL 3.2 04/02/2015   VLDL 14 04/02/2015   LDLCALC 118 (H) 04/02/2015   Lab Results  Component Value Date   TSH 0.922 01/14/2020   TSH 1.940 03/23/2015    Therapeutic Level Labs: No results found for: "LITHIUM" No results found for: "VALPROATE" No results found for: "CBMZ"  Current Medications: Current Outpatient Medications  Medication Sig Dispense Refill   buPROPion (WELLBUTRIN SR) 150 MG 12 hr tablet Take 1 tablet (150 mg total) by mouth 2 (two) times daily. 60 tablet 5   diazepam (VALIUM) 5  MG tablet Take 1 tablet (5 mg total) by mouth daily as needed. 30 tablet 0   doxycycline (VIBRA-TABS) 100 MG tablet Take 1 tablet (100 mg total) by mouth 2 (two) times daily. 20 tablet 0   eletriptan (RELPAX) 40 MG tablet Take 1 tablet (40 mg total) by mouth every 2 (two) hours as needed for migraine (May repeat in 2 hours if headache persists or recurs.). 10 tablet 0   fluconazole (DIFLUCAN) 150 MG tablet Take 1 tablet (150 mg total) by mouth every 3 (three) days as needed. 2 tablet 0   FLUoxetine (PROZAC) 10 MG capsule Take 1 capsule (10 mg total) by mouth daily. 30 capsule 0   lisdexamfetamine (VYVANSE) 60 MG capsule Take 1 capsule (60 mg total) by mouth every morning. 30 capsule 0   lisdexamfetamine (VYVANSE) 60 MG capsule Take 1 capsule (60 mg total) by mouth every morning. 30 capsule 0   lisdexamfetamine (VYVANSE) 60 MG capsule TAKE (1) CAPSULE BY MOUTH EACH MORNING. 30 capsule 0   pramipexole (MIRAPEX) 1.5 MG tablet Take 1 tablet (1.5 mg total) by mouth at bedtime. 30 tablet 5   SPRINTEC 28 0.25-35 MG-MCG tablet Take 1 tablet by mouth at bedtime.     No current facility-administered medications for this visit.     Musculoskeletal: Strength & Muscle Tone: within normal limits Gait & Station: normal Patient leans: N/A  Psychiatric Specialty Exam: Review of Systems  Constitutional:   Positive for fatigue.  Musculoskeletal:  Positive for arthralgias, joint swelling and myalgias.  All other systems reviewed and are negative.   There were no vitals taken for this visit.There is no height or weight on file to calculate BMI.  General Appearance: Casual, Neat, and Well Groomed  Eye Contact:  Good  Speech:  Clear and Coherent  Volume:  Normal  Mood:  Euthymic  Affect:  Congruent  Thought Process:  Goal Directed  Orientation:  Full (Time, Place, and Person)  Thought Content: WDL   Suicidal Thoughts:  No  Homicidal Thoughts:  No  Memory:  Immediate;   Good Recent;   Good Remote;   Good  Judgement:  Good  Insight:  Good  Psychomotor Activity:  Decreased  Concentration:  Concentration: Good and Attention Span: Good  Recall:  Good  Fund of Knowledge: Good  Language: Good  Akathisia:  No  Handed:  Right  AIMS (if indicated): not done  Assets:  Communication Skills Desire for Improvement Resilience Social Support Talents/Skills  ADL's:  Intact  Cognition: WNL  Sleep:  Fair   Screenings: GAD-7    Flowsheet Row Telemedicine from 06/07/2019 in [facility] Office Visit from 12/28/2017 in [facility]  Total GAD-7 Score 14 12      PHQ2-9    Flowsheet Row [facility] Video Visit from 04/29/2021 in [facility] Video Visit from 03/18/2021 in [facility] Video Visit from 09/10/2020 in [facility] Video Visit from 05/18/2020 in [facility]  PHQ-2 Total Score 2 1 1  0 0  PHQ-9 Total Score 10 -- -- -- --      Flowsheet Row Admission (Discharged) from 07/29/2021 in [facility] Video Visit from [facility]/29/2021 in [facility] Video Visit from [facility]/29/2021 [facility]  C-SSRS RISK CATEGORY No Risk No Risk No Risk        Assessment and Plan: This  patient is a 51 year old female with a history of fibromyalgia joint pain depression anxiety and binge eating disorder.  She feels stable on her current regimen.  She will continue Wellbutrin SR 150 mg twice daily for depression, Prozac 10 mg daily for OCD symptoms Vyvanse 60 mg daily for ADD and binge eating disorder and Valium 5 mg daily as needed for anxiety and sleep.  She will return to see me in 3 months  Collaboration of Care: Collaboration of Care: Primary Care Provider AEB notes will be shared with PCP at patient's request  Patient/Guardian was advised Release of Information must be obtained prior to any record release in order to collaborate their care with an outside provider. Patient/Guardian was advised if they have not already done so to contact the registration department to sign all necessary forms in order for [place] to release information regarding their care.   Consent: Patient/Guardian gives verbal consent for treatment and assignment of benefits for services provided during this visit. Patient/Guardian expressed understanding and agreed to proceed.    [name], MD 12/06/2021, 8:58 AM

## 2021-12-18 ENCOUNTER — Telehealth: Payer: BC Managed Care – PPO | Admitting: Nurse Practitioner

## 2021-12-18 DIAGNOSIS — B9689 Other specified bacterial agents as the cause of diseases classified elsewhere: Secondary | ICD-10-CM | POA: Diagnosis not present

## 2021-12-18 DIAGNOSIS — J329 Chronic sinusitis, unspecified: Secondary | ICD-10-CM

## 2021-12-18 MED ORDER — DOXYCYCLINE HYCLATE 100 MG PO TABS: 100.0000 mg | ORAL_TABLET | Freq: Two times a day (BID) | ORAL | 0 refills | Status: AC

## 2021-12-18 NOTE — Progress Notes (Signed)
I have spent 5 minutes in review of e-visit questionnaire, review and updating patient chart, medical decision making and response to patient.  ° °Sheri Wood W Brendan Gadson, NP ° °  °

## 2021-12-18 NOTE — Progress Notes (Signed)
Hello!!!   We can try to increase the doxycycline to 14 days. The issue is you have intolerances to the other antibiotics that are used for sinus infections. If it does not clear after this round we would have to instruct you to follow up with your PCP for possible imaging.

## 2022-01-03 ENCOUNTER — Telehealth: Payer: BC Managed Care – PPO | Admitting: Nurse Practitioner

## 2022-01-03 DIAGNOSIS — M62838 Other muscle spasm: Secondary | ICD-10-CM | POA: Diagnosis not present

## 2022-01-03 MED ORDER — CYCLOBENZAPRINE HCL 10 MG PO TABS: 10.0000 mg | ORAL_TABLET | Freq: Three times a day (TID) | ORAL | 0 refills | Status: DC | PRN

## 2022-01-03 NOTE — Progress Notes (Signed)
We are sorry that you are not feeling well.  Here is how we plan to help!  Based on what you have shared with me it looks like you mostly have acute back pain.  Acute back pain is defined as musculoskeletal pain that can resolve in 1-3 weeks with conservative treatment.  I have prescribed Flexeril 10 mg every eight hours as needed which is a muscle relaxer . You can continue to use ibuprofen as well as needed up to 600mg  every 6 hours with food. Some patients experience stomach irritation or in increased heartburn with anti-inflammatory drugs.  Please keep in mind that muscle relaxer's can cause fatigue and should not be taken while at work or driving.  Back pain is very common.  The pain often gets better over time.  The cause of back pain is usually not dangerous.  Most people can learn to manage their back pain on their own.  Home Care Stay active.  Start with short walks on flat ground if you can.  Try to walk farther each day. Do not sit, drive or stand in one place for more than 30 minutes.  Do not stay in bed. Do not avoid exercise or work.  Activity can help your back heal faster. Be careful when you bend or lift an object.  Bend at your knees, keep the object close to you, and do not twist. Sleep on a firm mattress.  Lie on your side, and bend your knees.  If you lie on your back, put a pillow under your knees. Only take medicines as told by your doctor. Put ice on the injured area. Put ice in a plastic bag Place a towel between your skin and the bag Leave the ice on for 15-20 minutes, 3-4 times a day for the first 2-3 days. 210 After that, you can switch between ice and heat packs. Ask your doctor about back exercises or massage. Avoid feeling anxious or stressed.  Find good ways to deal with stress, such as exercise.  Get Help Right Way If: Your pain does not go away with rest or medicine. Your pain does not go away in 1 week. You have new problems. You do not feel well. The  pain spreads into your legs. You cannot control when you poop (bowel movement) or pee (urinate) You feel sick to your stomach (nauseous) or throw up (vomit) You have belly (abdominal) pain. You feel like you may pass out (faint). If you develop a fever.  Make Sure you: Understand these instructions. Will watch your condition Will get help right away if you are not doing well or get worse.  Your e-visit answers were reviewed by a board certified advanced clinical practitioner to complete your personal care plan.  Depending on the condition, your plan could have included both over the counter or prescription medications.  If there is a problem please reply  once you have received a response from your provider.  Your safety is important to .  If you have drug allergies check your prescription carefully.    You can use MyChart to ask questions about today's visit, request a non-urgent call back, or ask for a work or school excuse for 24 hours related to this e-Visit. If it has been greater than 24 hours you will need to follow up with your provider, or enter a new e-Visit to address those concerns.  You will get an e-mail in the next two days asking about your experience.  I  hope that your e-visit has been valuable and will speed your recovery. Thank you for using e-visits.  Meds ordered this encounter  Medications   cyclobenzaprine (FLEXERIL) 10 MG tablet    Sig: Take 1 tablet (10 mg total) by mouth 3 (three) times daily as needed for muscle spasms. Will cause drowsiness    Dispense:  30 tablet    Refill:  0    I spent approximately 5 minutes reviewing the patient's history, current symptoms and coordinating their plan of care today.

## 2022-01-11 ENCOUNTER — Telehealth (HOSPITAL_COMMUNITY): Payer: BC Managed Care – PPO | Admitting: Psychiatry

## 2022-01-11 ENCOUNTER — Encounter (HOSPITAL_COMMUNITY): Payer: Self-pay | Admitting: Psychiatry

## 2022-01-11 DIAGNOSIS — F5081 Binge eating disorder: Secondary | ICD-10-CM

## 2022-01-11 DIAGNOSIS — F418 Other specified anxiety disorders: Secondary | ICD-10-CM | POA: Diagnosis not present

## 2022-01-11 DIAGNOSIS — F900 Attention-deficit hyperactivity disorder, predominantly inattentive type: Secondary | ICD-10-CM | POA: Diagnosis not present

## 2022-01-11 MED ORDER — BUPROPION HCL ER (SR) 150 MG PO TB12: 150.0000 mg | ORAL_TABLET | Freq: Two times a day (BID) | ORAL | 5 refills | Status: DC

## 2022-01-11 MED ORDER — LISDEXAMFETAMINE DIMESYLATE 60 MG PO CAPS
60.0000 mg | ORAL_CAPSULE | ORAL | 0 refills | Status: DC
Start: 2022-01-11 — End: 2022-03-15

## 2022-01-11 MED ORDER — FLUOXETINE HCL 20 MG PO CAPS: 20.0000 mg | ORAL_CAPSULE | Freq: Every day | ORAL | 2 refills | Status: DC

## 2022-01-11 MED ORDER — DIAZEPAM 5 MG PO TABS: 5.0000 mg | ORAL_TABLET | Freq: Two times a day (BID) | ORAL | 2 refills | Status: DC | PRN

## 2022-01-11 NOTE — Progress Notes (Signed)
Virtual Visit via Video Note  I connected with Sheri Wood on 01/11/22 at  9:20 AM EST by a video enabled telemedicine application and verified that I am speaking with the correct person using two identifiers.  Location: Patient: home Provider: office   I discussed the limitations of evaluation and management by telemedicine and the availability of in person appointments. The patient expressed understanding and agreed to proceed.     I discussed the assessment and treatment plan with the patient. The patient was provided an opportunity to ask questions and all were answered. The patient agreed with the plan and demonstrated an understanding of the instructions.   The patient was advised to call back or seek an in-person evaluation if the symptoms worsen or if the condition fails to improve as anticipated.  I provided 15 minutes of non-face-to-face time during this encounter.   Diannia Ruder, MD  [facility] MD/PA/NP OP Progress Note  01/11/2022 9:39 AM Sheri Wood  MRN:  [id number]  Chief Complaint:  Chief Complaint  Patient presents with   Anxiety   Depression   ADD   Follow-up   HPI: This patient is a 51 year old married white female who lives with her husband in [place].  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  The patient returns for follow-up as a work in after about 6 weeks.  She thinks that she is getting worse in terms of her depression and anxiety.  She has started a new job which entails learning a new computer system and she has been anxious about this.  She is also having menopausal symptoms such as hot flashes.  She is having more difficulty sleeping a lot more anxiety through the day.  She is getting more depressed although not suicidal.  Both of her children went through "mental health crises" this past year.  Her daughter had to be hospitalized for bipolar disorder and her son got involved with using substances and had to get into  treatment.  They are both doing better now but it took a toll on her.  We suggested going up on the Prozac to help with depression and anxiety as well as increasing Valium to twice a day.  She is also going to talk to OB/GYN about medications for hot flashes. Visit Diagnosis:    ICD-10-CM   1. Attention deficit hyperactivity disorder (ADHD), predominantly inattentive type  F90.0     2. Depression with anxiety  F41.8     3. Binge eating disorder  F50.81       Past Psychiatric History: Long-term outpatient treatment for depression and anxiety  Past Medical History:  Past Medical History:  Diagnosis Date   Allergy    seasonal   Anxiety    Depression    Fibromyalgia    Gastritis    history   GERD (gastroesophageal reflux disease)    otc med prn   IBS (irritable bowel syndrome)    Migraine    SVD (spontaneous vaginal delivery)    x 2   Urticaria, chronic    Vertigo     Past Surgical History:  Procedure Laterality Date   CARPOMETACARPEL SUSPENSION PLASTY Right 07/29/2021   Procedure: RIGHT THUMB TRAPEZIECTOMY AND SUSPENSIONPLASTY;  Surgeon: Betha Loa, MD;  Location: [facility];  Service: Orthopedics;  Laterality: Right;  75 MIN   CHOLECYSTECTOMY N/A 04/08/2020   Procedure: LAPAROSCOPIC CHOLECYSTECTOMY;  Surgeon: [name], MD;  Location: [facility];  Service: General;  Laterality: N/A;   COLONOSCOPY  2008   negative   DIAGNOSTIC LAPAROSCOPY     fibroids   DILATION AND CURETTAGE OF UTERUS     DILITATION & CURRETTAGE/HYSTROSCOPY WITH NOVASURE ABLATION N/A 05/06/2013   Procedure: DILATATION & CURETTAGE/HYSTEROSCOPY WITH NOVASURE ABLATION;  Surgeon: Alphonsus Sias. Ernestina Penna, MD;  Location: [facility];  Service: Gynecology;  Laterality: N/A;   EYE SURGERY     bilateral lasik   FOOT SURGERY  2017   LAPAROSCOPIC BILATERAL SALPINGECTOMY Bilateral 05/06/2013   Procedure: LAPAROSCOPIC BILATERAL SALPINGECTOMY;  Surgeon: Tresa Endo A. Ernestina Penna, MD;  Location: [facility];  Service:  Gynecology;  Laterality: Bilateral;   TENDON TRANSFER Right 07/29/2021   Procedure: TENDON TRANSFER;  Surgeon: Betha Loa, MD;  Location: [facility];  Service: Orthopedics;  Laterality: Right;   WISDOM TOOTH EXTRACTION      Family Psychiatric History: See below  Family History:  Family History  Problem Relation Age of Onset   Alcohol abuse Brother    Drug abuse Brother    Depression Mother    Anxiety disorder Mother    Anxiety disorder Brother    Depression Brother    Bipolar disorder Maternal Uncle    Anxiety disorder Paternal Grandfather    Depression Paternal Grandfather    Bipolar disorder Cousin    Breast cancer Maternal Aunt     Social History:  Social History   Socioeconomic History   Marital status: Married    Spouse name: Not on file   Number of children: Not on file   Years of education: Not on file   Highest education level: Not on file  Occupational History   Not on file  Tobacco Use   Smoking status: Former    Packs/day: 0.75    Years: 10.00    Total pack years: 7.50    Types: Cigarettes    Quit date: 02/08/1988    Years since quitting: 33.9   Smokeless tobacco: Never  Vaping Use   Vaping Use: Former  Substance and Sexual Activity   Alcohol use: No   Drug use: No   Sexual activity: Yes    Birth control/protection: None  Other Topics Concern   Not on file  Social History Narrative   Not on file   Social Determinants of Health   Financial Resource Strain: Not on file  Food Insecurity: Not on file  Transportation Needs: Not on file  Physical Activity: Not on file  Stress: Not on file  Social Connections: Not on file    Allergies:  Allergies  Allergen Reactions   Ciprofloxacin Other (See Comments)    Severe muscle and joint pain   Cefzil [Cefprozil]     possible hives   Augmentin [Amoxicillin-Pot Clavulanate] Nausea And Vomiting    Throat felt funny and gave pt anxiety   Bee Venom Swelling    Metabolic Disorder  Labs: No results found for: "HGBA1C", "MPG" No results found for: "PROLACTIN" Lab Results  Component Value Date   CHOL 192 04/02/2015   TRIG 69 04/02/2015   HDL 60 04/02/2015   CHOLHDL 3.2 04/02/2015   VLDL 14 04/02/2015   LDLCALC 118 (H) 04/02/2015   Lab Results  Component Value Date   TSH 0.922 01/14/2020   TSH 1.940 03/23/2015    Therapeutic Level Labs: No results found for: "LITHIUM" No results found for: "VALPROATE" No results found for: "CBMZ"  Current Medications: Current Outpatient Medications  Medication Sig Dispense Refill   FLUoxetine (PROZAC) 20 MG capsule Take  1 capsule (20 mg total) by mouth daily. 30 capsule 2   buPROPion (WELLBUTRIN SR) 150 MG 12 hr tablet Take 1 tablet (150 mg total) by mouth 2 (two) times daily. 60 tablet 5   cyclobenzaprine (FLEXERIL) 10 MG tablet Take 1 tablet (10 mg total) by mouth 3 (three) times daily as needed for muscle spasms. Will cause drowsiness 30 tablet 0   diazepam (VALIUM) 5 MG tablet Take 1 tablet (5 mg total) by mouth 2 (two) times daily as needed. 60 tablet 2   eletriptan (RELPAX) 40 MG tablet Take 1 tablet (40 mg total) by mouth every 2 (two) hours as needed for migraine (May repeat in 2 hours if headache persists or recurs.). 10 tablet 0   lisdexamfetamine (VYVANSE) 60 MG capsule TAKE (1) CAPSULE BY MOUTH EACH MORNING. 30 capsule 0   lisdexamfetamine (VYVANSE) 60 MG capsule Take 1 capsule (60 mg total) by mouth every morning. 30 capsule 0   lisdexamfetamine (VYVANSE) 60 MG capsule Take 1 capsule (60 mg total) by mouth every morning. 30 capsule 0   pramipexole (MIRAPEX) 1.5 MG tablet Take 1 tablet (1.5 mg total) by mouth at bedtime. 30 tablet 5   SPRINTEC 28 0.25-35 MG-MCG tablet Take 1 tablet by mouth at bedtime.     No current facility-administered medications for this visit.     Musculoskeletal: Strength & Muscle Tone: within normal limits Gait & Station: normal Patient leans: N/A  Psychiatric Specialty  Exam: Review of Systems  Musculoskeletal:  Positive for myalgias.  Psychiatric/Behavioral:  Positive for dysphoric mood and sleep disturbance. The patient is nervous/anxious.   All other systems reviewed and are negative.   There were no vitals taken for this visit.There is no height or weight on file to calculate BMI.  General Appearance: Casual, Neat, and Well Groomed  Eye Contact:  Good  Speech:  Clear and Coherent  Volume:  Normal  Mood:  Anxious and Dysphoric  Affect:  Appropriate and Congruent  Thought Process:  Goal Directed  Orientation:  Full (Time, Place, and Person)  Thought Content: Rumination   Suicidal Thoughts:  No  Homicidal Thoughts:  No  Memory:  Immediate;   Good Recent;   Good Remote;   Good  Judgement:  Good  Insight:  Good  Psychomotor Activity:  Decreased  Concentration:  Concentration: Good and Attention Span: Good  Recall:  Good  Fund of Knowledge: Good  Language: Good  Akathisia:  No  Handed:  Right  AIMS (if indicated): not done  Assets:  Communication Skills Desire for Improvement Physical Health Resilience Social Support Talents/Skills  ADL's:  Intact  Cognition: WNL  Sleep:  Fair   Screenings: GAD-7    Flowsheet Row Telemedicine from 06/07/2019 in [facility] Office Visit from 12/28/2017 in [facility]  Total GAD-7 Score 14 12      PHQ2-9    Flowsheet Row [facility] Video Visit from [facility]/29/2021 [facility] Video Visit from 04/29/2021 in [facility] Video Visit from 03/18/2021 [facility] Video Visit from 09/10/2020 [facility]  PHQ-2 Total Score 4 2 1 1  0  PHQ-9 Total Score 9 10 -- -- --      Flowsheet Row [facility] Admission (Discharged) from 07/29/2021 in [facility] Video Visit from 06/29/2021 in [facility]  C-SSRS RISK CATEGORY No Risk No Risk No Risk        Assessment and Plan: This patient is a 51 year old female with a history of fibromyalgia joint pain depression anxiety binge eating disorder.  She is also developed menopausal symptoms such as hot flashes and poor sleep.  Since she is more depressed now we will increase Prozac to 20 mg for depression and OCD, continue Wellbutrin SR 150 mg twice daily for depression Vyvanse 60 mg daily for ADD and binge eating just order.  She will increase Valium to 5 mg twice daily as needed for anxiety and sleep.  She will return to see me in 2 months  Collaboration of Care: Collaboration of Care: Primary Care Provider AEB notes will be shared with PCP at patient's request  Patient/Guardian was advised Release of Information must be obtained prior to any record release in order to collaborate their care with an outside provider. Patient/Guardian was advised if they have not already done so to contact the registration department to sign all necessary forms in order for us to release information regarding their care.   Consent: Patient/Guardian gives verbal consent for treatment and assignment of benefits for services provided during this visit. Patient/Guardian expressed understanding and agreed to proceed.    Diannia Rudereborah Alyas Creary, MD 01/11/2022, 9:39 AM

## 2022-01-27 ENCOUNTER — Other Ambulatory Visit (HOSPITAL_COMMUNITY): Payer: Self-pay | Admitting: Family Medicine

## 2022-01-27 DIAGNOSIS — E785 Hyperlipidemia, unspecified: Secondary | ICD-10-CM

## 2022-02-03 ENCOUNTER — Other Ambulatory Visit: Payer: Self-pay | Admitting: Obstetrics and Gynecology

## 2022-02-03 DIAGNOSIS — R928 Other abnormal and inconclusive findings on diagnostic imaging of breast: Secondary | ICD-10-CM

## 2022-02-10 ENCOUNTER — Ambulatory Visit (HOSPITAL_COMMUNITY)
Admission: RE | Admit: 2022-02-10 | Discharge: 2022-02-10 | Disposition: A | Discharge status: Home / Self Care | Payer: BC Managed Care – PPO | Source: Ambulatory Visit | Attending: Family Medicine | Admitting: Family Medicine

## 2022-02-10 DIAGNOSIS — E785 Hyperlipidemia, unspecified: Secondary | ICD-10-CM | POA: Insufficient documentation

## 2022-02-22 ENCOUNTER — Telehealth (HOSPITAL_COMMUNITY): Payer: Self-pay | Admitting: *Deleted

## 2022-02-22 ENCOUNTER — Other Ambulatory Visit (HOSPITAL_COMMUNITY): Payer: Self-pay | Admitting: Psychiatry

## 2022-02-22 MED ORDER — LISDEXAMFETAMINE DIMESYLATE 60 MG PO CAPS: ORAL_CAPSULE | ORAL | 0 refills | Status: DC

## 2022-02-22 NOTE — Telephone Encounter (Signed)
sent

## 2022-02-22 NOTE — Telephone Encounter (Signed)
Patient called stating she changed to [place]. Per pt, [facility] can't transfer the Vyvanse over due to law. Per pt she would like for provider to please send in new scripts to [facility]. Per pt she only have 3 more tabs left.

## 2022-02-25 ENCOUNTER — Inpatient Hospital Stay: Admission: RE | Admit: 2022-02-25 | Payer: BC Managed Care – PPO | Source: Ambulatory Visit

## 2022-02-25 ENCOUNTER — Other Ambulatory Visit: Payer: BC Managed Care – PPO

## 2022-03-08 ENCOUNTER — Telehealth (HOSPITAL_COMMUNITY): Payer: BC Managed Care – PPO | Admitting: Psychiatry

## 2022-03-15 ENCOUNTER — Encounter (HOSPITAL_COMMUNITY): Payer: Self-pay | Admitting: Psychiatry

## 2022-03-15 ENCOUNTER — Telehealth (HOSPITAL_COMMUNITY): Payer: BC Managed Care – PPO | Admitting: Psychiatry

## 2022-03-15 DIAGNOSIS — F5081 Binge eating disorder: Secondary | ICD-10-CM | POA: Diagnosis not present

## 2022-03-15 DIAGNOSIS — F900 Attention-deficit hyperactivity disorder, predominantly inattentive type: Secondary | ICD-10-CM | POA: Diagnosis not present

## 2022-03-15 DIAGNOSIS — F418 Other specified anxiety disorders: Secondary | ICD-10-CM

## 2022-03-15 MED ORDER — LISDEXAMFETAMINE DIMESYLATE 70 MG PO CAPS: 70.0000 mg | ORAL_CAPSULE | Freq: Every day | ORAL | 0 refills | Status: DC

## 2022-03-15 MED ORDER — DIAZEPAM 5 MG PO TABS: 5.0000 mg | ORAL_TABLET | Freq: Two times a day (BID) | ORAL | 2 refills | Status: DC | PRN

## 2022-03-15 MED ORDER — BUPROPION HCL ER (SR) 150 MG PO TB12: 150.0000 mg | ORAL_TABLET | Freq: Two times a day (BID) | ORAL | 5 refills | Status: DC

## 2022-03-15 MED ORDER — FLUOXETINE HCL 20 MG PO CAPS: 20.0000 mg | ORAL_CAPSULE | Freq: Every day | ORAL | 2 refills | Status: DC

## 2022-03-15 NOTE — Progress Notes (Signed)
Virtual Visit via Video Note  I connected with Sheri Wood on 03/15/22 at  9:40 AM EST by a video enabled telemedicine application and verified that I am speaking with the correct person using two identifiers.  Location: Patient: home Provider: office   I discussed the limitations of evaluation and management by telemedicine and the availability of in person appointments. The patient expressed understanding and agreed to proceed.    I discussed the assessment and treatment plan with the patient. The patient was provided an opportunity to ask questions and all were answered. The patient agreed with the plan and demonstrated an understanding of the instructions.   The patient was advised to call back or seek an in-person evaluation if the symptoms worsen or if the condition fails to improve as anticipated.  I provided 15 minutes of non-face-to-face time during this encounter.   Sheri Spiller, MD  [facility] MD/PA/NP OP Progress Note  03/15/2022 9:54 AM Sheri Wood  MRN:  [id number]  Chief Complaint:  Chief Complaint  Patient presents with   Depression   Anxiety   Follow-up   HPI: This patient is a 52 year old married white female who lives with her husband in [place]. 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   The patient returns for follow-up after 2 months.  Last time she thought her depression and anxiety were worse so we will increase Prozac to 20 mg daily.  She states that she still has small episodes of depression but they are much better.  Her sleep is variable but generally she is getting enough sleep.  Her children are doing better so she is not quite as worried.  She is using the Valium sporadically for anxiety.  Her OB/GYN suggested she go off her hormone replacement and now she is spotting a little bit probably in perimenopause.  She does have hot flashes at night and palpitations.  She does not think the Vyvanse is helping as much as it used  to for her binge eating disorder so we will go up another notch to 70 mg.  However if the palpitations worsen she will let me know she recently had lab work and a "calcium test" at her primary care and everything was normal Visit Diagnosis:    ICD-10-CM   1. Depression with anxiety  F41.8     2. Attention deficit hyperactivity disorder (ADHD), predominantly inattentive type  F90.0     3. Binge eating disorder  F50.81       Past Psychiatric History: Long-term outpatient treatment for depression and anxiety  Past Medical History:  Past Medical History:  Diagnosis Date   Allergy    seasonal   Anxiety    Depression    Fibromyalgia    Gastritis    history   GERD (gastroesophageal reflux disease)    otc med prn   IBS (irritable bowel syndrome)    Migraine    SVD (spontaneous vaginal delivery)    x 2   Urticaria, chronic    Vertigo     Past Surgical History:  Procedure Laterality Date   CARPOMETACARPEL SUSPENSION PLASTY Right 07/29/2021   Procedure: RIGHT THUMB TRAPEZIECTOMY AND SUSPENSIONPLASTY;  Surgeon: Leanora Cover, MD;  Location: [facility];  Service: Orthopedics;  Laterality: Right;  75 MIN   CHOLECYSTECTOMY N/A 04/08/2020   Procedure: LAPAROSCOPIC CHOLECYSTECTOMY;  Surgeon: Virl Cagey, MD;  Location: [facility];  Service: General;  Laterality: N/A;   COLONOSCOPY  2008  negative   DIAGNOSTIC LAPAROSCOPY     fibroids   DILATION AND CURETTAGE OF UTERUS     DILITATION & CURRETTAGE/HYSTROSCOPY WITH NOVASURE ABLATION N/A 05/06/2013   Procedure: DILATATION & CURETTAGE/HYSTEROSCOPY WITH NOVASURE ABLATION;  Surgeon: Alphonsus Sias. Ernestina Penna, MD;  Location: [facility];  Service: Gynecology;  Laterality: N/A;   EYE SURGERY     bilateral lasik   FOOT SURGERY  2017   LAPAROSCOPIC BILATERAL SALPINGECTOMY Bilateral 05/06/2013   Procedure: LAPAROSCOPIC BILATERAL SALPINGECTOMY;  Surgeon: Tresa Endo A. Ernestina Penna, MD;  Location: [facility];  Service: Gynecology;  Laterality: Bilateral;    TENDON TRANSFER Right 07/29/2021   Procedure: TENDON TRANSFER;  Surgeon: Betha Loa, MD;  Location: [facility];  Service: Orthopedics;  Laterality: Right;   WISDOM TOOTH EXTRACTION      Family Psychiatric History: See below  Family History:  Family History  Problem Relation Age of Onset   Alcohol abuse Brother    Drug abuse Brother    Depression Mother    Anxiety disorder Mother    Anxiety disorder Brother    Depression Brother    Bipolar disorder Maternal Uncle    Anxiety disorder Paternal Grandfather    Depression Paternal Grandfather    Bipolar disorder Cousin    Breast cancer Maternal Aunt     Social History:  Social History   Socioeconomic History   Marital status: Married    Spouse name: Not on file   Number of children: Not on file   Years of education: Not on file   Highest education level: Not on file  Occupational History   Not on file  Tobacco Use   Smoking status: Former    Packs/day: 0.75    Years: 10.00    Total pack years: 7.50    Types: Cigarettes    Quit date: 02/08/1988    Years since quitting: 34.1   Smokeless tobacco: Never  Vaping Use   Vaping Use: Former  Substance and Sexual Activity   Alcohol use: No   Drug use: No   Sexual activity: Yes    Birth control/protection: None  Other Topics Concern   Not on file  Social History Narrative   Not on file   Social Determinants of Health   Financial Resource Strain: Not on file  Food Insecurity: Not on file  Transportation Needs: Not on file  Physical Activity: Not on file  Stress: Not on file  Social Connections: Not on file    Allergies:  Allergies  Allergen Reactions   Ciprofloxacin Other (See Comments)    Severe muscle and joint pain   Cefzil [Cefprozil]     possible hives   Augmentin [Amoxicillin-Pot Clavulanate] Nausea And Vomiting    Throat felt funny and gave pt anxiety   Bee Venom Swelling    Metabolic Disorder Labs: No results found for: "HGBA1C",  "MPG" No results found for: "PROLACTIN" Lab Results  Component Value Date   CHOL 192 04/02/2015   TRIG 69 04/02/2015   HDL 60 04/02/2015   CHOLHDL 3.2 04/02/2015   VLDL 14 04/02/2015   LDLCALC 118 (H) 04/02/2015   Lab Results  Component Value Date   TSH 0.922 01/14/2020   TSH 1.940 03/23/2015    Therapeutic Level Labs: No results found for: "LITHIUM" No results found for: "VALPROATE" No results found for: "CBMZ"  Current Medications: Current Outpatient Medications  Medication Sig Dispense Refill   lisdexamfetamine (VYVANSE) 70 MG capsule Take 1 capsule (70 mg total) by mouth daily. 30  capsule 0   lisdexamfetamine (VYVANSE) 70 MG capsule Take 1 capsule (70 mg total) by mouth daily. 30 capsule 0   lisdexamfetamine (VYVANSE) 70 MG capsule Take 1 capsule (70 mg total) by mouth daily. 30 capsule 0   buPROPion (WELLBUTRIN SR) 150 MG 12 hr tablet Take 1 tablet (150 mg total) by mouth 2 (two) times daily. 60 tablet 5   cyclobenzaprine (FLEXERIL) 10 MG tablet Take 1 tablet (10 mg total) by mouth 3 (three) times daily as needed for muscle spasms. Will cause drowsiness 30 tablet 0   diazepam (VALIUM) 5 MG tablet Take 1 tablet (5 mg total) by mouth 2 (two) times daily as needed. 60 tablet 2   eletriptan (RELPAX) 40 MG tablet Take 1 tablet (40 mg total) by mouth every 2 (two) hours as needed for migraine (May repeat in 2 hours if headache persists or recurs.). 10 tablet 0   FLUoxetine (PROZAC) 20 MG capsule Take 1 capsule (20 mg total) by mouth daily. 30 capsule 2   pramipexole (MIRAPEX) 1.5 MG tablet Take 1 tablet (1.5 mg total) by mouth at bedtime. 30 tablet 5   SPRINTEC 28 0.25-35 MG-MCG tablet Take 1 tablet by mouth at bedtime.     No current facility-administered medications for this visit.     Musculoskeletal: Strength & Muscle Tone: within normal limits Gait & Station: normal Patient leans: N/A  Psychiatric Specialty Exam: Review of Systems  Musculoskeletal:  Positive for  myalgias.  All other systems reviewed and are negative.   There were no vitals taken for this visit.There is no height or weight on file to calculate BMI.  General Appearance: Casual, Neat, and Well Groomed  Eye Contact:  Good  Speech:  Clear and Coherent  Volume:  Normal  Mood:  Euthymic  Affect:  Appropriate and Congruent  Thought Process:  Goal Directed  Orientation:  Full (Time, Place, and Person)  Thought Content: Rumination   Suicidal Thoughts:  No  Homicidal Thoughts:  No  Memory:  Immediate;   Good Recent;   Good Remote;   Fair  Judgement:  Good  Insight:  Good  Psychomotor Activity:  Normal  Concentration:  Concentration: Good and Attention Span: Good  Recall:  Good  Fund of Knowledge: Good  Language: Good  Akathisia:  No  Handed:  Right  AIMS (if indicated): not done  Assets:  Communication Skills Desire for Improvement Physical Health Resilience Social Support Talents/Skills Vocational/Educational  ADL's:  Intact  Cognition: WNL  Sleep:  Fair   Screenings: GAD-7    Flowsheet Row Telemedicine from 06/07/2019 in [facility] Visit from 12/28/2017 in [facility]  Total GAD-7 Score 14 12      PHQ2-9    Flowsheet Row Video Visit from 03/15/2022 in [facility] at [place] Video Visit from 01/11/2022 in [facility] at [place] Video Visit from 06/29/2021 in [facility] at [place] Video Visit from 04/29/2021 in [facility] at [place] Video Visit from 03/18/2021 in [facility] at [facility] Total Score 2 4 2 1 1   PHQ-9 Total Score 8 9 10  -- --      Flowsheet Row Video Visit from 01/11/2022 in [facility] at [place] Admission (Discharged) from 07/29/2021 in [facility] Video Visit from 06/29/2021 in [place] [facility] at  [place] No Risk No Risk No Risk  Assessment and Plan: This patient is a 52 year old female with a history of fibromyalgia joint pain depression anxiety and binge eating disorder.  She also has some perimenopausal symptoms such as hot flashes and palpitations.  She seems better in terms of depression and OCD on the Prozac 20 mg so this will be continued.  She will continue Wellbutrin SR 150 mg twice daily for depression, Valium 5 mg twice daily as needed for anxiety and sleep.  She will increase Vyvanse to 70 mg every morning for ADD and binge eating disorder.  She will return to see me in 3 months  Collaboration of Care: Collaboration of Care: Primary Care Provider AEB notes will be shared with PCP at patient's request  Patient/Guardian was advised Release of Information must be obtained prior to any record release in order to collaborate their care with an outside provider. Patient/Guardian was advised if they have not already done so to contact the registration department to sign all necessary forms in order for [place] to release information regarding their care.   Consent: Patient/Guardian gives verbal consent for treatment and assignment of benefits for services provided during this visit. Patient/Guardian expressed understanding and agreed to proceed.    Sheri Spiller, MD 03/15/2022, 9:54 AM

## 2022-03-28 ENCOUNTER — Other Ambulatory Visit: Payer: Self-pay | Admitting: Obstetrics and Gynecology

## 2022-03-28 ENCOUNTER — Ambulatory Visit: Payer: BC Managed Care – PPO

## 2022-03-28 ENCOUNTER — Ambulatory Visit
Admission: RE | Admit: 2022-03-28 | Discharge: 2022-03-28 | Disposition: A | Discharge status: Home / Self Care | Payer: BC Managed Care – PPO | Source: Ambulatory Visit | Attending: Obstetrics and Gynecology | Admitting: Obstetrics and Gynecology

## 2022-03-28 DIAGNOSIS — R928 Other abnormal and inconclusive findings on diagnostic imaging of breast: Secondary | ICD-10-CM

## 2022-03-28 DIAGNOSIS — N6310 Unspecified lump in the right breast, unspecified quadrant: Secondary | ICD-10-CM

## 2022-04-07 ENCOUNTER — Encounter: Payer: Self-pay | Admitting: Radiology

## 2022-04-11 ENCOUNTER — Other Ambulatory Visit (HOSPITAL_COMMUNITY): Payer: Self-pay | Admitting: Psychiatry

## 2022-04-11 ENCOUNTER — Telehealth (HOSPITAL_COMMUNITY): Payer: Self-pay | Admitting: *Deleted

## 2022-04-11 MED ORDER — LISDEXAMFETAMINE DIMESYLATE 70 MG PO CAPS
70.0000 mg | ORAL_CAPSULE | Freq: Every day | ORAL | 0 refills | Status: DC
Start: 2022-04-11 — End: 2022-10-17

## 2022-04-11 NOTE — Telephone Encounter (Signed)
Patient called stating she would like for provider to please call her Vyvanse name brand to [facility] on [address] in [place]. Per pt the reason for this is due to her official pharmacy do not have it in [place] only have it in stock for the name brand.

## 2022-04-11 NOTE — Telephone Encounter (Signed)
sent 

## 2022-05-02 ENCOUNTER — Telehealth: Payer: BC Managed Care – PPO | Admitting: Family Medicine

## 2022-05-02 DIAGNOSIS — R21 Rash and other nonspecific skin eruption: Secondary | ICD-10-CM

## 2022-05-02 MED ORDER — NYSTATIN 100000 UNIT/GM EX POWD: 1.0000 | Freq: Three times a day (TID) | CUTANEOUS | 0 refills | Status: DC

## 2022-05-02 MED ORDER — NYSTATIN 100000 UNIT/GM EX CREA: 1.0000 | TOPICAL_CREAM | Freq: Two times a day (BID) | CUTANEOUS | 0 refills | Status: DC

## 2022-05-02 NOTE — Progress Notes (Addendum)
E Visit for Rash  We are sorry that you are not feeling well. Here is how we plan to help.  Based upon your presentation it appears you have a fungal infection.  I have prescribed: and Nystatin cream apply to the affected area twice daily   HOME CARE:  Take cool showers and avoid direct sunlight. Apply cool compress or wet dressings. Take a bath in an oatmeal bath.  Sprinkle content of one Aveeno packet under running faucet with comfortably warm water.  Bathe for 15-20 minutes, 1-2 times daily.  Pat dry with a towel. Do not rub the rash. Use hydrocortisone cream. Take an antihistamine like Benadryl for widespread rashes that itch.  The adult dose of Benadryl is 25-50 mg by mouth 4 times daily. Caution:  This type of medication may cause sleepiness.  Do not drink alcohol, drive, or operate dangerous machinery while taking antihistamines.  Do not take these medications if you have prostate enlargement.  Read package instructions thoroughly on all medications that you take.  GET HELP RIGHT AWAY IF:  Symptoms don't go away after treatment. Severe itching that persists. If you rash spreads or swells. If you rash begins to smell. If it blisters and opens or develops a yellow-brown crust. You develop a fever. You have a sore throat. You become short of breath.  MAKE SURE YOU:  Understand these instructions. Will watch your condition. Will get help right away if you are not doing well or get worse.  Thank you for choosing an e-visit.  Your e-visit answers were reviewed by a board certified advanced clinical practitioner to complete your personal care plan. Depending upon the condition, your plan could have included both over the counter or prescription medications.  Please review your pharmacy choice. Make sure the pharmacy is open so you can pick up prescription now. If there is a problem, you may contact your provider through [employer] and have the prescription routed to  another pharmacy.  Your safety is important to [place]. If you have drug allergies check your prescription carefully.   For the next 24 hours you can use MyChart to ask questions about today's visit, request a non-urgent call back, or ask for a work or school excuse. You will get an email in the next two days asking about your experience. I hope that your e-visit has been valuable and will speed your recovery.  I provided 5 minutes of non face-to-face time during this encounter for chart review, medication and order placement, as well as and documentation.

## 2022-05-02 NOTE — Addendum Note (Signed)
Addended by: Perlie Mayo on: 05/02/2022 01:42 PM   Modules accepted: Orders, Level of Service

## 2022-05-15 ENCOUNTER — Telehealth: Payer: BC Managed Care – PPO | Admitting: Family Medicine

## 2022-05-15 ENCOUNTER — Telehealth: Payer: BC Managed Care – PPO | Admitting: Family

## 2022-05-15 DIAGNOSIS — B372 Candidiasis of skin and nail: Secondary | ICD-10-CM

## 2022-05-15 MED ORDER — FLUCONAZOLE 150 MG PO TABS: 150.0000 mg | ORAL_TABLET | ORAL | 0 refills | Status: DC | PRN

## 2022-05-15 MED ORDER — NYSTATIN 100000 UNIT/GM EX POWD: 1.0000 | Freq: Three times a day (TID) | CUTANEOUS | 0 refills | Status: DC

## 2022-05-15 MED ORDER — KETOCONAZOLE 2 % EX CREA
1.0000 | TOPICAL_CREAM | Freq: Every day | CUTANEOUS | 0 refills | Status: DC
Start: 2022-05-15 — End: 2023-03-24

## 2022-05-15 NOTE — Progress Notes (Signed)
   Thank you for the details you included in the comment boxes. Those details are very helpful in determining the best course of treatment for you and help [place] to provide the best care.Because Sheri Wood, we recommend that you convert this visit to a video visit in order for the provider to better assess what is going on.  The provider will be able to give you a more accurate diagnosis and treatment plan if we can more freely discuss your symptoms and with the addition of a virtual examination.   If you convert to a video visit, we will bill your insurance (similar to an office visit) and you will not be charged for this e-Visit. You will be able to stay at home and speak with the first available [facility] advanced practice provider. The link to do a video visit is in the drop down Menu tab of your Welcome screen in MyChart.   have provided 5 minutes of non face to face time during this encounter for chart review and documentation.

## 2022-05-15 NOTE — Progress Notes (Signed)
Virtual Visit Consent   Sheri Wood, you are scheduled for a virtual visit with a [facility] provider today. Just as with appointments in the office, your consent must be obtained to participate. Your consent will be active for this visit and any virtual visit you may have with one of our providers in the next 365 days. If you have a MyChart account, a copy of this consent can be sent to you electronically.  As this is a virtual visit, video technology does not allow for your provider to perform a traditional examination. This may limit your provider's ability to fully assess your condition. If your provider identifies any concerns that need to be evaluated in person or the need to arrange testing (such as labs, EKG, etc.), we will make arrangements to do so. Although advances in technology are sophisticated, we cannot ensure that it will always work on either your end or our end. If the connection with a video visit is poor, the visit may have to be switched to a telephone visit. With either a video or telephone visit, we are not always able to ensure that we have a secure connection.  By engaging in this virtual visit, you consent to the provision of healthcare and authorize for your insurance to be billed (if applicable) for the services provided during this visit. Depending on your insurance coverage, you may receive a charge related to this service.  I need to obtain your verbal consent now. Are you willing to proceed with your visit today? Sheri Wood has provided verbal consent on 05/15/2022 for a virtual visit (video or telephone). Sheri Rodney, FNP  Date: 05/15/2022 3:17 PM  Virtual Visit via Video Note   I, Sheri Wood, connected with  Sheri Wood  ([id number], 1970/08/01) on 05/15/22 at  3:15 PM EDT by a video-enabled telemedicine application and verified that I am speaking with the correct person using two identifiers.  Location: Patient: Virtual Visit Location Patient:  Home Provider: Virtual Visit Location Provider: Home Office   I discussed the limitations of evaluation and management by telemedicine and the availability of in person appointments. The patient expressed understanding and agreed to proceed.    History of Present Illness: Sheri Wood is a 52 y.o. who identifies as a female who was assigned female at birth, and is being seen today for rash under bilateral breast that is worse under left.  HPI: Rash This is a new problem. The current episode started 1 to 4 weeks ago. The problem has been waxing and waning since onset. Location: under bilateral breast. The rash is characterized by burning, itchiness, pain, redness and blistering. Past treatments include anti-itch cream (nystatin). The treatment provided mild relief.    Problems:  Patient Active Problem List   Diagnosis Date Noted   Calculus of gallbladder without cholecystitis without obstruction 03/31/2020   Binge eating disorder 06/30/2016   Morbid obesity due to excess calories 06/17/2015   Restless leg syndrome 06/08/2013   Fibromyalgia 10/12/2012   Migraine headache without aura 09/17/2012   Depression with anxiety 09/17/2012    Allergies:  Allergies  Allergen Reactions   Ciprofloxacin Other (See Comments)    Severe muscle and joint pain   Cefzil [Cefprozil]     possible hives   Augmentin [Amoxicillin-Pot Clavulanate] Nausea And Vomiting    Throat felt funny and gave pt anxiety   Bee Venom Swelling   Medications:  Current Outpatient Medications:    fluconazole (DIFLUCAN) 150 MG tablet,  Take 1 tablet (150 mg total) by mouth every three (3) days as needed., Disp: 3 tablet, Rfl: 0   ketoconazole (NIZORAL) 2 % cream, Apply 1 Application topically daily., Disp: 15 g, Rfl: 0   nystatin (MYCOSTATIN/NYSTOP) powder, Apply 1 Application topically 3 (three) times daily., Disp: 15 g, Rfl: 0   buPROPion (WELLBUTRIN SR) 150 MG 12 hr tablet, Take 1 tablet (150 mg total) by mouth 2  (two) times daily., Disp: 60 tablet, Rfl: 5   cyclobenzaprine (FLEXERIL) 10 MG tablet, Take 1 tablet (10 mg total) by mouth 3 (three) times daily as needed for muscle spasms. Will cause drowsiness, Disp: 30 tablet, Rfl: 0   diazepam (VALIUM) 5 MG tablet, Take 1 tablet (5 mg total) by mouth 2 (two) times daily as needed., Disp: 60 tablet, Rfl: 2   eletriptan (RELPAX) 40 MG tablet, Take 1 tablet (40 mg total) by mouth every 2 (two) hours as needed for migraine (May repeat in 2 hours if headache persists or recurs.)., Disp: 10 tablet, Rfl: 0   FLUoxetine (PROZAC) 20 MG capsule, Take 1 capsule (20 mg total) by mouth daily., Disp: 30 capsule, Rfl: 2   lisdexamfetamine (VYVANSE) 70 MG capsule, Take 1 capsule (70 mg total) by mouth daily., Disp: 30 capsule, Rfl: 0   lisdexamfetamine (VYVANSE) 70 MG capsule, Take 1 capsule (70 mg total) by mouth daily., Disp: 30 capsule, Rfl: 0   lisdexamfetamine (VYVANSE) 70 MG capsule, Take 1 capsule (70 mg total) by mouth daily., Disp: 30 capsule, Rfl: 0   nystatin cream (MYCOSTATIN), Apply 1 Application topically 2 (two) times daily., Disp: 30 g, Rfl: 0   pramipexole (MIRAPEX) 1.5 MG tablet, Take 1 tablet (1.5 mg total) by mouth at bedtime., Disp: 30 tablet, Rfl: 5   SPRINTEC 28 0.25-35 MG-MCG tablet, Take 1 tablet by mouth at bedtime., Disp: , Rfl:   Observations/Objective: Patient is well-developed, well-nourished in no acute distress.  Resting comfortably at home.  Head is normocephalic, atraumatic.  No labored breathing. Speech is clear and coherent with logical content.  Patient is alert and oriented at baseline.  Erythemas rash under bilateral breast  Assessment and Plan: 1. Skin yeast infection - fluconazole (DIFLUCAN) 150 MG tablet; Take 1 tablet (150 mg total) by mouth every three (3) days as needed.  Dispense: 3 tablet; Refill: 0 - ketoconazole (NIZORAL) 2 % cream; Apply 1 Application topically daily.  Dispense: 15 g; Refill: 0 - nystatin  (MYCOSTATIN/NYSTOP) powder; Apply 1 Application topically 3 (three) times daily.  Dispense: 15 g; Refill: 0  Keep clean and dry Avoid scratching  Follow up if symptoms worsen or do not improve   Follow Up Instructions: I discussed the assessment and treatment plan with the patient. The patient was provided an opportunity to ask questions and all were answered. The patient agreed with the plan and demonstrated an understanding of the instructions.  A copy of instructions were sent to the patient via MyChart unless otherwise noted below.     The patient was advised to call back or seek an in-person evaluation if the symptoms worsen or if the condition fails to improve as anticipated.  Time:  I spent 9 minutes with the patient via telehealth technology discussing the above problems/concerns.    Sheri Rodney, FNP

## 2022-06-01 ENCOUNTER — Other Ambulatory Visit: Payer: Self-pay | Admitting: Orthopedic Surgery

## 2022-06-01 DIAGNOSIS — M25551 Pain in right hip: Secondary | ICD-10-CM

## 2022-06-01 DIAGNOSIS — R1030 Lower abdominal pain, unspecified: Secondary | ICD-10-CM

## 2022-06-08 ENCOUNTER — Telehealth (HOSPITAL_COMMUNITY): Payer: Self-pay | Admitting: *Deleted

## 2022-06-08 NOTE — Telephone Encounter (Signed)
Per pt, she want to be changed from [facility] to Cymbalta and she will stop the Prozac and will continue with Wellburtrin and Vyvanse due to fibromyalgia chronic pain.

## 2022-06-08 NOTE — Telephone Encounter (Signed)
I don't prescribe the lyrica so she needs to address this with that prescriber before we make changes

## 2022-06-09 NOTE — Telephone Encounter (Signed)
Informed patient and she verbalized understanding. Per pt she will call her PCP

## 2022-06-09 NOTE — Telephone Encounter (Signed)
LMOM

## 2022-06-24 ENCOUNTER — Encounter (HOSPITAL_COMMUNITY): Payer: Self-pay | Admitting: Psychiatry

## 2022-06-24 ENCOUNTER — Telehealth (INDEPENDENT_AMBULATORY_CARE_PROVIDER_SITE_OTHER): Payer: BC Managed Care – PPO | Admitting: Psychiatry

## 2022-06-24 DIAGNOSIS — F418 Other specified anxiety disorders: Secondary | ICD-10-CM

## 2022-06-24 DIAGNOSIS — F5081 Binge eating disorder: Secondary | ICD-10-CM | POA: Diagnosis not present

## 2022-06-24 MED ORDER — DIAZEPAM 5 MG PO TABS: 5.0000 mg | ORAL_TABLET | Freq: Two times a day (BID) | ORAL | 2 refills | Status: DC | PRN

## 2022-06-24 MED ORDER — LISDEXAMFETAMINE DIMESYLATE 70 MG PO CAPS
70.0000 mg | ORAL_CAPSULE | Freq: Every day | ORAL | 0 refills | Status: AC
Start: 2022-06-24 — End: ?

## 2022-06-24 MED ORDER — BUPROPION HCL ER (SR) 150 MG PO TB12: 150.0000 mg | ORAL_TABLET | Freq: Two times a day (BID) | ORAL | 5 refills | Status: DC

## 2022-06-24 MED ORDER — LISDEXAMFETAMINE DIMESYLATE 70 MG PO CAPS: 70.0000 mg | ORAL_CAPSULE | Freq: Every day | ORAL | 0 refills | Status: DC

## 2022-06-24 MED ORDER — DULOXETINE HCL 60 MG PO CPEP: 60.0000 mg | ORAL_CAPSULE | Freq: Every day | ORAL | 2 refills | Status: DC

## 2022-06-24 NOTE — Progress Notes (Signed)
Virtual Visit via Video Note  I connected with Sheri Wood on 06/24/22 at  9:40 AM EDT by a video enabled telemedicine application and verified that I am speaking with the correct person using two identifiers.  Location: Patient: home Provider: office   I discussed the limitations of evaluation and management by telemedicine and the availability of in person appointments. The patient expressed understanding and agreed to proceed.    I discussed the assessment and treatment plan with the patient. The patient was provided an opportunity to ask questions and all were answered. The patient agreed with the plan and demonstrated an understanding of the instructions.   The patient was advised to call back or seek an in-person evaluation if the symptoms worsen or if the condition fails to improve as anticipated.  I provided 15 minutes of non-face-to-face time during this encounter.   Diannia Ruder, MD  [facility] MD/PA/NP OP Progress Note  06/24/2022 9:56 AM Sheri Wood  MRN:  [id number]  Chief Complaint:  Chief Complaint  Patient presents with   Anxiety   Depression   ADD   Follow-up   HPI:  This patient is a 52 year old married white female who lives with her husband in [place]. 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   The patient returns for follow-up after 3 months regarding her depression anxiety and binge eating disorder.  Last time we increased the Vyvanse and it has helped with her focus and also helped her cut down on the eating.  However she is having more fibromyalgia aches and pains and would like to switch Prozac to Cymbalta and I think this is reasonable.  Her depression is actually not too bad right now and she is enjoying her job.  The Valium continues to help with anxiety but she uses it sparingly.  Most of the time she is sleeping pretty well.  She denies any thoughts of self-harm or suicide Visit Diagnosis:    ICD-10-CM   1. Depression  with anxiety  F41.8     2. Binge eating disorder  F50.81       Past Psychiatric History: Long-term outpatient treatment for depression and anxiety  Past Medical History:  Past Medical History:  Diagnosis Date   Allergy    seasonal   Anxiety    Depression    Fibromyalgia    Gastritis    history   GERD (gastroesophageal reflux disease)    otc med prn   IBS (irritable bowel syndrome)    Migraine    SVD (spontaneous vaginal delivery)    x 2   Urticaria, chronic    Vertigo     Past Surgical History:  Procedure Laterality Date   CARPOMETACARPEL SUSPENSION PLASTY Right 07/29/2021   Procedure: RIGHT THUMB TRAPEZIECTOMY AND SUSPENSIONPLASTY;  Surgeon: Betha Loa, MD;  Location: [facility];  Service: Orthopedics;  Laterality: Right;  75 MIN   CHOLECYSTECTOMY N/A 04/08/2020   Procedure: LAPAROSCOPIC CHOLECYSTECTOMY;  Surgeon: [name], MD;  Location: [facility];  Service: General;  Laterality: N/A;   COLONOSCOPY  2008   negative   DIAGNOSTIC LAPAROSCOPY     fibroids   DILATION AND CURETTAGE OF UTERUS     DILITATION & CURRETTAGE/HYSTROSCOPY WITH NOVASURE ABLATION N/A 05/06/2013   Procedure: DILATATION & CURETTAGE/HYSTEROSCOPY WITH NOVASURE ABLATION;  Surgeon: Alphonsus Sias. Ernestina Penna, MD;  Location: [facility];  Service: Gynecology;  Laterality: N/A;   EYE SURGERY     bilateral lasik  FOOT SURGERY  2017   LAPAROSCOPIC BILATERAL SALPINGECTOMY Bilateral 05/06/2013   Procedure: LAPAROSCOPIC BILATERAL SALPINGECTOMY;  Surgeon: Tresa Endo A. Ernestina Penna, MD;  Location: [facility];  Service: Gynecology;  Laterality: Bilateral;   TENDON TRANSFER Right 07/29/2021   Procedure: TENDON TRANSFER;  Surgeon: Betha Loa, MD;  Location: [facility];  Service: Orthopedics;  Laterality: Right;   WISDOM TOOTH EXTRACTION      Family Psychiatric History: see below  Family History:  Family History  Problem Relation Age of Onset   Alcohol abuse Brother    Drug abuse Brother     Depression Mother    Anxiety disorder Mother    Anxiety disorder Brother    Depression Brother    Bipolar disorder Maternal Uncle    Anxiety disorder Paternal Grandfather    Depression Paternal Grandfather    Bipolar disorder Cousin    Breast cancer Maternal Aunt     Social History:  Social History   Socioeconomic History   Marital status: Married    Spouse name: Not on file   Number of children: Not on file   Years of education: Not on file   Highest education level: Not on file  Occupational History   Not on file  Tobacco Use   Smoking status: Former    Packs/day: 0.75    Years: 10.00    Additional pack years: 0.00    Total pack years: 7.50    Types: Cigarettes    Quit date: 02/08/1988    Years since quitting: 34.3   Smokeless tobacco: Never  Vaping Use   Vaping Use: Former  Substance and Sexual Activity   Alcohol use: No   Drug use: No   Sexual activity: Yes    Birth control/protection: None  Other Topics Concern   Not on file  Social History Narrative   Not on file   Social Determinants of Health   Financial Resource Strain: Not on file  Food Insecurity: Not on file  Transportation Needs: Not on file  Physical Activity: Not on file  Stress: Not on file  Social Connections: Not on file    Allergies:  Allergies  Allergen Reactions   Ciprofloxacin Other (See Comments)    Severe muscle and joint pain   Cefzil [Cefprozil]     possible hives   Augmentin [Amoxicillin-Pot Clavulanate] Nausea And Vomiting    Throat felt funny and gave pt anxiety   Bee Venom Swelling    Metabolic Disorder Labs: No results found for: "HGBA1C", "MPG" No results found for: "PROLACTIN" Lab Results  Component Value Date   CHOL 192 04/02/2015   TRIG 69 04/02/2015   HDL 60 04/02/2015   CHOLHDL 3.2 04/02/2015   VLDL 14 04/02/2015   LDLCALC 118 (H) 04/02/2015   Lab Results  Component Value Date   TSH 0.922 01/14/2020   TSH 1.940 03/23/2015    Therapeutic Level  Labs: No results found for: "LITHIUM" No results found for: "VALPROATE" No results found for: "CBMZ"  Current Medications: Current Outpatient Medications  Medication Sig Dispense Refill   DULoxetine (CYMBALTA) 60 MG capsule Take 1 capsule (60 mg total) by mouth daily. 30 capsule 2   buPROPion (WELLBUTRIN SR) 150 MG 12 hr tablet Take 1 tablet (150 mg total) by mouth 2 (two) times daily. 60 tablet 5   cyclobenzaprine (FLEXERIL) 10 MG tablet Take 1 tablet (10 mg total) by mouth 3 (three) times daily as needed for muscle spasms. Will cause drowsiness 30 tablet 0  diazepam (VALIUM) 5 MG tablet Take 1 tablet (5 mg total) by mouth 2 (two) times daily as needed. 60 tablet 2   eletriptan (RELPAX) 40 MG tablet Take 1 tablet (40 mg total) by mouth every 2 (two) hours as needed for migraine (May repeat in 2 hours if headache persists or recurs.). 10 tablet 0   fluconazole (DIFLUCAN) 150 MG tablet Take 1 tablet (150 mg total) by mouth every three (3) days as needed. 3 tablet 0   ketoconazole (NIZORAL) 2 % cream Apply 1 Application topically daily. 15 g 0   lisdexamfetamine (VYVANSE) 70 MG capsule Take 1 capsule (70 mg total) by mouth daily. 30 capsule 0   lisdexamfetamine (VYVANSE) 70 MG capsule Take 1 capsule (70 mg total) by mouth daily. 30 capsule 0   lisdexamfetamine (VYVANSE) 70 MG capsule Take 1 capsule (70 mg total) by mouth daily. 30 capsule 0   nystatin (MYCOSTATIN/NYSTOP) powder Apply 1 Application topically 3 (three) times daily. 15 g 0   nystatin cream (MYCOSTATIN) Apply 1 Application topically 2 (two) times daily. 30 g 0   pramipexole (MIRAPEX) 1.5 MG tablet Take 1 tablet (1.5 mg total) by mouth at bedtime. 30 tablet 5   SPRINTEC 28 0.25-35 MG-MCG tablet Take 1 tablet by mouth at bedtime.     No current facility-administered medications for this visit.     Musculoskeletal: Strength & Muscle Tone: within normal limits Gait & Station: normal Patient leans: N/A  Psychiatric Specialty  Exam: Review of Systems  Constitutional:  Positive for fatigue.  Musculoskeletal:  Positive for myalgias.  All other systems reviewed and are negative.   There were no vitals taken for this visit.There is no height or weight on file to calculate BMI.  General Appearance: Casual and Fairly Groomed  Eye Contact:  Good  Speech:  Clear and Coherent  Volume:  Normal  Mood:  Euthymic  Affect:  Congruent  Thought Process:  Goal Directed  Orientation:  Full (Time, Place, and Person)  Thought Content: WDL   Suicidal Thoughts:  No  Homicidal Thoughts:  No  Memory:  Immediate;   Good Recent;   Good Remote;   Good  Judgement:  Good  Insight:  Good  Psychomotor Activity:  Decreased  Concentration:  Concentration: Good and Attention Span: Good  Recall:  Good  Fund of Knowledge: Good  Language: Good  Akathisia:  No  Handed:  Right  AIMS (if indicated): not done  Assets:  Communication Skills Desire for Improvement Physical Health Resilience Social Support Vocational/Educational  ADL's:  Intact  Cognition: WNL  Sleep:  Fair   Screenings: GAD-7    Flowsheet Row Telemedicine from 06/07/2019 in [facility] Office Visit from 12/28/2017 in [facility]  Total GAD-7 Score 14 12      PHQ2-9    Flowsheet Row Video Visit from 03/15/2022 in [facility] at [place] Video Visit from 01/11/2022 in [facility] at [place] Video Visit from 06/29/2021 in [facility] at [place] Video Visit from 04/29/2021 in [facility] at [place] Video Visit from 03/18/2021 in [facility] at [facility] Total Score 2 4 2 1 1   PHQ-9 Total Score 8 9 10  -- --      Flowsheet Row Video Visit from 01/11/2022 in [facility] at [place] Admission (Discharged) from 07/29/2021 in [facility] Video Visit from  06/29/2021 in [facility]  at [facility] RISK CATEGORY No Risk No Risk No Risk        Assessment and Plan: This patient is a 52 year old female with a history of fibromyalgia joint pain depression anxiety and binge eating disorder.  She will would like to switch Prozac to Cymbalta so we will start with 60 mg daily.  She will also continue Wellbutrin SR 150 mg twice daily for depression, Valium 5 mg twice daily as needed for anxiety and sleep and Vyvanse 70 mg every morning for ADD and binge eating disorder.  She will return to see me in 6 weeks  Collaboration of Care: Collaboration of Care: Primary Care Provider AEB notes will be shared with PCP at patient's request  Patient/Guardian was advised Release of Information must be obtained prior to any record release in order to collaborate their care with an outside provider. Patient/Guardian was advised if they have not already done so to contact the registration department to sign all necessary forms in order for [place] to release information regarding their care.   Consent: Patient/Guardian gives verbal consent for treatment and assignment of benefits for services provided during this visit. Patient/Guardian expressed understanding and agreed to proceed.    Diannia Ruder, MD 06/24/2022, 9:56 AM

## 2022-07-07 ENCOUNTER — Telehealth: Payer: BC Managed Care – PPO | Admitting: Physician Assistant

## 2022-07-07 DIAGNOSIS — R21 Rash and other nonspecific skin eruption: Secondary | ICD-10-CM

## 2022-07-07 NOTE — Progress Notes (Signed)
Because of recurring rash despite prior treatmments, I feel your condition warrants further evaluation and I recommend that you be seen in a face to face visit.   NOTE: There will be NO CHARGE for this eVisit   If you are having a true medical emergency please call 911.      For an urgent face to face visit, [place] has eight urgent care centers for your convenience:   NEW!! [facility] Health Urgent Care Center at [facility] Get Driving Directions [phone] [address], [address] [place], [address]    [facility] Health Urgent Care Center at [facility] Get Driving Directions [phone] [address] [place], [place] [address]   [facility] Health Urgent Care Center [facility]) Get Driving Directions [phone] [address] [place], [place] [address]  [facility] Health Urgent Care Center [facility]) Get Driving Directions [phone] [address] [place],  [place]  [address]  [facility] Health Urgent Care Center [facility] - at [employer]  [phone] [place],  [place] [address]   [facility] Health Urgent Care at [facility] Get Driving Directions [phone] [address] [place] [address] [place], [place] [address]   [facility] Health Urgent Care at [facility] Get Driving Directions  [phone] [address] [place], [place] [address]   [facility] Health Urgent Care at [facility] Directions [phone] [address] [place], [place] [address]  Your MyChart E-visit questionnaire answers were reviewed by a board certified advanced clinical practitioner to complete your personal care plan based on your specific symptoms.  Thank you for using e-Visits.

## 2022-08-05 ENCOUNTER — Telehealth (INDEPENDENT_AMBULATORY_CARE_PROVIDER_SITE_OTHER): Payer: BC Managed Care – PPO | Admitting: Psychiatry

## 2022-08-05 ENCOUNTER — Encounter (HOSPITAL_COMMUNITY): Payer: Self-pay | Admitting: Psychiatry

## 2022-08-05 DIAGNOSIS — F418 Other specified anxiety disorders: Secondary | ICD-10-CM

## 2022-08-05 DIAGNOSIS — F5081 Binge eating disorder: Secondary | ICD-10-CM

## 2022-08-05 DIAGNOSIS — F900 Attention-deficit hyperactivity disorder, predominantly inattentive type: Secondary | ICD-10-CM | POA: Diagnosis not present

## 2022-08-05 NOTE — Progress Notes (Signed)
BH MD/PA/NP OP Progress Note  08/05/2022 9:50 AM Sheri Wood  MRN:  [id number]  Chief Complaint:  Chief Complaint  Patient presents with   ADHD   Anxiety   Depression   Follow-up   HPI: This patient is a 52 year old married white female who lives with her husband in [place]. 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   The patient returns for follow-up after 6 weeks regarding her depression anxiety and binge eating disorder.  Last time we changed her Prozac to Cymbalta because of her fibromyalgia pain.  She states that it has helped a good deal.  However back in March she developed a rash under her breast which resolved with antifungal ointment.  However since then she has had more rashes on her lower back under her arms and spreading onto her torso.  It is not entirely clear that this is from the Cymbalta but her primary doctor wanted to discuss possibly stopping it and/or trying steroids.  Since the patient has had such good benefit from Cymbalta and has not had any other symptoms such as shortness of breath or trouble breathing I suggested she do as a course of prednisone first to see if it will clear up.  She is in agreement.  Her mood is good as was her focus and the Vyvanse continues to help to decrease appetite and to help her focus. Visit Diagnosis:    ICD-10-CM   1. Depression with anxiety  F41.8     2. Binge eating disorder  F50.81     3. Attention deficit hyperactivity disorder (ADHD), predominantly inattentive type  F90.0       Past Psychiatric History: Long-term outpatient treatment for depression and anxiety  Past Medical History:  Past Medical History:  Diagnosis Date   Allergy    seasonal   Anxiety    Depression    Fibromyalgia    Gastritis    history   GERD (gastroesophageal reflux disease)    otc med prn   IBS (irritable bowel syndrome)    Migraine    SVD (spontaneous vaginal delivery)    x 2   Urticaria, chronic     Vertigo     Past Surgical History:  Procedure Laterality Date   CARPOMETACARPEL SUSPENSION PLASTY Right 07/29/2021   Procedure: RIGHT THUMB TRAPEZIECTOMY AND SUSPENSIONPLASTY;  Surgeon: Betha Loa, MD;  Location: [facility];  Service: Orthopedics;  Laterality: Right;  75 MIN   CHOLECYSTECTOMY N/A 04/08/2020   Procedure: LAPAROSCOPIC CHOLECYSTECTOMY;  Surgeon: [name], MD;  Location: [facility];  Service: General;  Laterality: N/A;   COLONOSCOPY  2008   negative   DIAGNOSTIC LAPAROSCOPY     fibroids   DILATION AND CURETTAGE OF UTERUS     DILITATION & CURRETTAGE/HYSTROSCOPY WITH NOVASURE ABLATION N/A 05/06/2013   Procedure: DILATATION & CURETTAGE/HYSTEROSCOPY WITH NOVASURE ABLATION;  Surgeon: Alphonsus Sias. Ernestina Penna, MD;  Location: [facility];  Service: Gynecology;  Laterality: N/A;   EYE SURGERY     bilateral lasik   FOOT SURGERY  2017   LAPAROSCOPIC BILATERAL SALPINGECTOMY Bilateral 05/06/2013   Procedure: LAPAROSCOPIC BILATERAL SALPINGECTOMY;  Surgeon: Tresa Endo A. Ernestina Penna, MD;  Location: [facility];  Service: Gynecology;  Laterality: Bilateral;   TENDON TRANSFER Right 07/29/2021   Procedure: TENDON TRANSFER;  Surgeon: Betha Loa, MD;  Location: [facility];  Service: Orthopedics;  Laterality: Right;   WISDOM TOOTH EXTRACTION      Family Psychiatric History: See below  Family History:  Family History  Problem Relation Age of Onset   Alcohol abuse Brother    Drug abuse Brother    Depression Mother    Anxiety disorder Mother    Anxiety disorder Brother    Depression Brother    Bipolar disorder Maternal Uncle    Anxiety disorder Paternal Grandfather    Depression Paternal Grandfather    Bipolar disorder Cousin    Breast cancer Maternal Aunt     Social History:  Social History   Socioeconomic History   Marital status: Married    Spouse name: Not on file   Number of children: Not on file   Years of education: Not on file   Highest education level: Not  on file  Occupational History   Not on file  Tobacco Use   Smoking status: Former    Packs/day: 0.75    Years: 10.00    Additional pack years: 0.00    Total pack years: 7.50    Types: Cigarettes    Quit date: 02/08/1988    Years since quitting: 34.5   Smokeless tobacco: Never  Vaping Use   Vaping Use: Former  Substance and Sexual Activity   Alcohol use: No   Drug use: No   Sexual activity: Yes    Birth control/protection: None  Other Topics Concern   Not on file  Social History Narrative   Not on file   Social Determinants of Health   Financial Resource Strain: Not on file  Food Insecurity: Not on file  Transportation Needs: Not on file  Physical Activity: Not on file  Stress: Not on file  Social Connections: Not on file    Allergies:  Allergies  Allergen Reactions   Ciprofloxacin Other (See Comments)    Severe muscle and joint pain   Cefzil [Cefprozil]     possible hives   Augmentin [Amoxicillin-Pot Clavulanate] Nausea And Vomiting    Throat felt funny and gave pt anxiety   Bee Venom Swelling    Metabolic Disorder Labs: No results found for: "HGBA1C", "MPG" No results found for: "PROLACTIN" Lab Results  Component Value Date   CHOL 192 04/02/2015   TRIG 69 04/02/2015   HDL 60 04/02/2015   CHOLHDL 3.2 04/02/2015   VLDL 14 04/02/2015   LDLCALC 118 (H) 04/02/2015   Lab Results  Component Value Date   TSH 0.922 01/14/2020   TSH 1.940 03/23/2015    Therapeutic Level Labs: No results found for: "LITHIUM" No results found for: "VALPROATE" No results found for: "CBMZ"  Current Medications: Current Outpatient Medications  Medication Sig Dispense Refill   buPROPion (WELLBUTRIN SR) 150 MG 12 hr tablet Take 1 tablet (150 mg total) by mouth 2 (two) times daily. 60 tablet 5   cyclobenzaprine (FLEXERIL) 10 MG tablet Take 1 tablet (10 mg total) by mouth 3 (three) times daily as needed for muscle spasms. Will cause drowsiness 30 tablet 0   diazepam (VALIUM) 5  MG tablet Take 1 tablet (5 mg total) by mouth 2 (two) times daily as needed. 60 tablet 2   DULoxetine (CYMBALTA) 60 MG capsule Take 1 capsule (60 mg total) by mouth daily. 30 capsule 2   eletriptan (RELPAX) 40 MG tablet Take 1 tablet (40 mg total) by mouth every 2 (two) hours as needed for migraine (May repeat in 2 hours if headache persists or recurs.). 10 tablet 0   fluconazole (DIFLUCAN) 150 MG tablet Take 1 tablet (150 mg total) by mouth every three (3) days  as needed. 3 tablet 0   ketoconazole (NIZORAL) 2 % cream Apply 1 Application topically daily. 15 g 0   lisdexamfetamine (VYVANSE) 70 MG capsule Take 1 capsule (70 mg total) by mouth daily. 30 capsule 0   lisdexamfetamine (VYVANSE) 70 MG capsule Take 1 capsule (70 mg total) by mouth daily. 30 capsule 0   lisdexamfetamine (VYVANSE) 70 MG capsule Take 1 capsule (70 mg total) by mouth daily. 30 capsule 0   nystatin (MYCOSTATIN/NYSTOP) powder Apply 1 Application topically 3 (three) times daily. 15 g 0   nystatin cream (MYCOSTATIN) Apply 1 Application topically 2 (two) times daily. 30 g 0   pramipexole (MIRAPEX) 1.5 MG tablet Take 1 tablet (1.5 mg total) by mouth at bedtime. 30 tablet 5   SPRINTEC 28 0.25-35 MG-MCG tablet Take 1 tablet by mouth at bedtime.     No current facility-administered medications for this visit.     Musculoskeletal: Strength & Muscle Tone: within normal limits Gait & Station: normal Patient leans: N/A  Psychiatric Specialty Exam: Review of Systems  Musculoskeletal:  Positive for myalgias.  Skin:  Positive for rash.  All other systems reviewed and are negative.   There were no vitals taken for this visit.There is no height or weight on file to calculate BMI.  General Appearance: Casual, Neat, and Well Groomed  Eye Contact:  Good  Speech:  Clear and Coherent  Volume:  Normal  Mood:  Euthymic  Affect:  Congruent  Thought Process:  Goal Directed  Orientation:  Full (Time, Place, and Person)  Thought  Content: WDL   Suicidal Thoughts:  No  Homicidal Thoughts:  No  Memory:  Immediate;   Good Recent;   Good Remote;   Good  Judgement:  Good  Insight:  Good  Psychomotor Activity:  Normal  Concentration:  Concentration: Good and Attention Span: Good  Recall:  Good  Fund of Knowledge: Good  Language: Good  Akathisia:  No  Handed:  Right  AIMS (if indicated): not done  Assets:  Communication Skills Desire for Improvement Physical Health Resilience Social Support  ADL's:  Intact  Cognition: WNL  Sleep:  Good   Screenings: GAD-7    Flowsheet Row Telemedicine from 06/07/2019 in [facility] Office Visit from 12/28/2017 in [facility]  Total GAD-7 Score 14 12      PHQ2-9    Flowsheet Row Video Visit from 03/15/2022 in [facility] at [place] Video Visit from 01/11/2022 in [facility] at [place] Video Visit from 06/29/2021 in [facility] at [place] Video Visit from 04/29/2021 in [facility] at [place] Video Visit from 03/18/2021 in [facility] at [facility] Total Score 2 4 2 1 1   PHQ-9 Total Score 8 9 10  -- --      Flowsheet Row Video Visit from 01/11/2022 in [facility] at [place] Admission (Discharged) from 07/29/2021 in [facility] Video Visit from 06/29/2021 in [facility] at [place]  C-SSRS RISK CATEGORY No Risk No Risk No Risk        Assessment and Plan: This patient is a 52 year old female with a history of fibromyalgia, joint pain depression anxiety and binge eating disorder.  The Cymbalta has helped her mood as well as her aches and pains from fibromyalgia.  She has developed a rash which seems to predate the Cymbalta.  For now I suggested she try the prednisone  taper from her primary care and call if this does not improve and we  can discuss changing the Cymbalta to something else.  She will also continue Wellbutrin SR 150 mg daily for depression, Valium 5 mg twice daily as needed for anxiety or sleep and Vyvanse 70 mg every morning for ADD and binge eating disorder.  She will return to see me in 3 months  Collaboration of Care: Collaboration of Care: Primary Care Provider AEB notes will be shared with PCP at patient's request  Patient/Guardian was advised Release of Information must be obtained prior to any record release in order to collaborate their care with an outside provider. Patient/Guardian was advised if they have not already done so to contact the registration department to sign all necessary forms in order for [place] to release information regarding their care.   Consent: Patient/Guardian gives verbal consent for treatment and assignment of benefits for services provided during this visit. Patient/Guardian expressed understanding and agreed to proceed.    Diannia Ruder, MD 08/05/2022, 9:50 AM

## 2022-08-15 ENCOUNTER — Other Ambulatory Visit (HOSPITAL_COMMUNITY): Payer: Self-pay | Admitting: Psychiatry

## 2022-08-15 ENCOUNTER — Encounter (HOSPITAL_COMMUNITY): Payer: Self-pay

## 2022-08-15 MED ORDER — DULOXETINE HCL 30 MG PO CPEP: ORAL_CAPSULE | ORAL | 0 refills | Status: DC

## 2022-09-27 ENCOUNTER — Other Ambulatory Visit: Payer: BC Managed Care – PPO

## 2022-10-04 ENCOUNTER — Ambulatory Visit
Admission: RE | Admit: 2022-10-04 | Discharge: 2022-10-04 | Disposition: A | Discharge status: Home / Self Care | Payer: BC Managed Care – PPO | Source: Ambulatory Visit | Attending: Obstetrics and Gynecology | Admitting: Obstetrics and Gynecology

## 2022-10-04 DIAGNOSIS — N6310 Unspecified lump in the right breast, unspecified quadrant: Secondary | ICD-10-CM

## 2022-10-04 DIAGNOSIS — R928 Other abnormal and inconclusive findings on diagnostic imaging of breast: Secondary | ICD-10-CM

## 2022-10-06 ENCOUNTER — Other Ambulatory Visit: Payer: Self-pay | Admitting: Obstetrics and Gynecology

## 2022-10-06 DIAGNOSIS — R928 Other abnormal and inconclusive findings on diagnostic imaging of breast: Secondary | ICD-10-CM

## 2022-10-17 ENCOUNTER — Other Ambulatory Visit (HOSPITAL_COMMUNITY): Payer: Self-pay | Admitting: Psychiatry

## 2022-10-17 ENCOUNTER — Telehealth (HOSPITAL_COMMUNITY): Payer: Self-pay | Admitting: *Deleted

## 2022-10-17 MED ORDER — LISDEXAMFETAMINE DIMESYLATE 70 MG PO CAPS: 70.0000 mg | ORAL_CAPSULE | Freq: Every day | ORAL | 0 refills | Status: DC

## 2022-10-17 NOTE — Telephone Encounter (Signed)
sent 

## 2022-10-17 NOTE — Telephone Encounter (Signed)
Patient called requesting refills for her Vyvase to be sent to [facility] on [address] in [place].

## 2022-10-18 NOTE — Telephone Encounter (Signed)
LMOM

## 2022-11-08 ENCOUNTER — Encounter (HOSPITAL_COMMUNITY): Payer: Self-pay | Admitting: Psychiatry

## 2022-11-08 ENCOUNTER — Telehealth (INDEPENDENT_AMBULATORY_CARE_PROVIDER_SITE_OTHER): Payer: BC Managed Care – PPO | Admitting: Psychiatry

## 2022-11-08 DIAGNOSIS — F900 Attention-deficit hyperactivity disorder, predominantly inattentive type: Secondary | ICD-10-CM | POA: Diagnosis not present

## 2022-11-08 DIAGNOSIS — F418 Other specified anxiety disorders: Secondary | ICD-10-CM

## 2022-11-08 DIAGNOSIS — F5081 Binge eating disorder, mild: Secondary | ICD-10-CM | POA: Diagnosis not present

## 2022-11-08 DIAGNOSIS — M797 Fibromyalgia: Secondary | ICD-10-CM

## 2022-11-08 MED ORDER — LISDEXAMFETAMINE DIMESYLATE 70 MG PO CAPS: 70.0000 mg | ORAL_CAPSULE | Freq: Every day | ORAL | 0 refills | Status: DC

## 2022-11-08 MED ORDER — DULOXETINE HCL 60 MG PO CPEP: 60.0000 mg | ORAL_CAPSULE | Freq: Every day | ORAL | 2 refills | Status: DC

## 2022-11-08 MED ORDER — BUPROPION HCL ER (SR) 150 MG PO TB12: 150.0000 mg | ORAL_TABLET | Freq: Two times a day (BID) | ORAL | 5 refills | Status: DC

## 2022-11-08 MED ORDER — DIAZEPAM 5 MG PO TABS: 5.0000 mg | ORAL_TABLET | Freq: Two times a day (BID) | ORAL | 2 refills | Status: DC | PRN

## 2022-11-08 NOTE — Progress Notes (Signed)
Virtual Visit via Video Note  I connected with Sheri Wood on 11/08/22 at  9:40 AM EDT by a video enabled telemedicine application and verified that I am speaking with the correct person using two identifiers.  Location: Patient: home Provider: office   I discussed the limitations of evaluation and management by telemedicine and the availability of in person appointments. The patient expressed understanding and agreed to proceed.     I discussed the assessment and treatment plan with the patient. The patient was provided an opportunity to ask questions and all were answered. The patient agreed with the plan and demonstrated an understanding of the instructions.   The patient was advised to call back or seek an in-person evaluation if the symptoms worsen or if the condition fails to improve as anticipated.  I provided 15 minutes of non-face-to-face time during this encounter.   Diannia Ruder, MD  [facility] MD/PA/NP OP Progress Note  11/08/2022 10:00 AM Sheri Wood  MRN:  [id number]  Chief Complaint:  Chief Complaint  Patient presents with   Depression   Anxiety   Follow-up   HPI: This patient is a 52 year old married white female who lives with her husband in [place].  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  The patient returns for follow-up after 3 months regarding depression anxiety ADD and binge eating disorder as well as fibromyalgia and chronic pain.  She states that the rash that she had a few months ago is finally starting to abate.  We had tapered off Cymbalta 3 months ago but that did not affect the rash for quite a while.  She was on several rounds of prednisone.  She does want to retry the Cymbalta because it really helped the chronic pain and her mood.  She has been more depressed since stopping it although she is still on Wellbutrin.  The Vyvanse is helping her focus and binge eating.  Given that she is having these rashes as well as  joint pain and severe fibromyalgia and fatigue I suggested she ask her doctor for referral to rheumatology.  She states that she has had test done at his office for lupus and other autoimmune disorders but it seems like something is being missed here.  She is in agreement. Visit Diagnosis:    ICD-10-CM   1. Depression with anxiety  F41.8     2. Attention deficit hyperactivity disorder (ADHD), predominantly inattentive type  F90.0     3. 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       Past Psychiatric History: Long-term outpatient treatment for depression and anxiety  Past Medical History:  Past Medical History:  Diagnosis Date   Allergy    seasonal   Anxiety    Depression    Fibromyalgia    Gastritis    history   GERD (gastroesophageal reflux disease)    otc med prn   IBS (irritable bowel syndrome)    Migraine    SVD (spontaneous vaginal delivery)    x 2   Urticaria, chronic    Vertigo     Past Surgical History:  Procedure Laterality Date   CARPOMETACARPEL SUSPENSION PLASTY Right 07/29/2021   Procedure: RIGHT THUMB TRAPEZIECTOMY AND SUSPENSIONPLASTY;  Surgeon: Betha Loa, MD;  Location: [facility];  Service: Orthopedics;  Laterality: Right;  75 MIN   CHOLECYSTECTOMY N/A 04/08/2020   Procedure: LAPAROSCOPIC CHOLECYSTECTOMY;  Surgeon: [name], MD;  Location: [facility];  Service: General;  Laterality: N/A;  COLONOSCOPY  2008   negative   DIAGNOSTIC LAPAROSCOPY     fibroids   DILATION AND CURETTAGE OF UTERUS     DILITATION & CURRETTAGE/HYSTROSCOPY WITH NOVASURE ABLATION N/A 05/06/2013   Procedure: DILATATION & CURETTAGE/HYSTEROSCOPY WITH NOVASURE ABLATION;  Surgeon: Alphonsus Sias. Ernestina Penna, MD;  Location: [facility];  Service: Gynecology;  Laterality: N/A;   EYE SURGERY     bilateral lasik   FOOT SURGERY  2017   LAPAROSCOPIC BILATERAL SALPINGECTOMY Bilateral 05/06/2013   Procedure: LAPAROSCOPIC BILATERAL SALPINGECTOMY;  Surgeon: Tresa Endo A. Ernestina Penna, MD;  Location: [facility];  Service: Gynecology;  Laterality: Bilateral;   TENDON TRANSFER Right 07/29/2021   Procedure: TENDON TRANSFER;  Surgeon: Betha Loa, MD;  Location: [facility];  Service: Orthopedics;  Laterality: Right;   WISDOM TOOTH EXTRACTION      Family Psychiatric History: See below  Family History:  Family History  Problem Relation Age of Onset   Alcohol abuse Brother    Drug abuse Brother    Depression Mother    Anxiety disorder Mother    Anxiety disorder Brother    Depression Brother    Bipolar disorder Maternal Uncle    Anxiety disorder Paternal Grandfather    Depression Paternal Grandfather    Bipolar disorder Cousin    Breast cancer Maternal Aunt     Social History:  Social History   Socioeconomic History   Marital status: Married    Spouse name: Not on file   Number of children: Not on file   Years of education: Not on file   Highest education level: Not on file  Occupational History   Not on file  Tobacco Use   Smoking status: Former    Current packs/day: 0.00    Average packs/day: 0.8 packs/day for 10.0 years (7.5 ttl pk-yrs)    Types: Cigarettes    Start date: 02/07/1978    Quit date: 02/08/1988    Years since quitting: 34.7   Smokeless tobacco: Never  Vaping Use   Vaping status: Former  Substance and Sexual Activity   Alcohol use: No   Drug use: No   Sexual activity: Yes    Birth control/protection: None  Other Topics Concern   Not on file  Social History Narrative   Not on file   Social Determinants of Health   Financial Resource Strain: Not on file  Food Insecurity: Not on file  Transportation Needs: Not on file  Physical Activity: Not on file  Stress: Not on file  Social Connections: Not on file    Allergies:  Allergies  Allergen Reactions   Ciprofloxacin Other (See Comments)    Severe muscle and joint pain   Cefzil [Cefprozil]     possible hives   Augmentin [Amoxicillin-Pot Clavulanate] Nausea And Vomiting    Throat felt  funny and gave pt anxiety   Bee Venom Swelling    Metabolic Disorder Labs: No results found for: "HGBA1C", "MPG" No results found for: "PROLACTIN" Lab Results  Component Value Date   CHOL 192 04/02/2015   TRIG 69 04/02/2015   HDL 60 04/02/2015   CHOLHDL 3.2 04/02/2015   VLDL 14 04/02/2015   LDLCALC 118 (H) 04/02/2015   Lab Results  Component Value Date   TSH 0.922 01/14/2020   TSH 1.940 03/23/2015    Therapeutic Level Labs: No results found for: "LITHIUM" No results found for: "VALPROATE" No results found for: "CBMZ"  Current Medications: Current Outpatient Medications  Medication Sig Dispense Refill   DULoxetine (CYMBALTA)  60 MG capsule Take 1 capsule (60 mg total) by mouth daily. 30 capsule 2   buPROPion (WELLBUTRIN SR) 150 MG 12 hr tablet Take 1 tablet (150 mg total) by mouth 2 (two) times daily. 60 tablet 5   cyclobenzaprine (FLEXERIL) 10 MG tablet Take 1 tablet (10 mg total) by mouth 3 (three) times daily as needed for muscle spasms. Will cause drowsiness 30 tablet 0   diazepam (VALIUM) 5 MG tablet Take 1 tablet (5 mg total) by mouth 2 (two) times daily as needed. 60 tablet 2   DULoxetine (CYMBALTA) 30 MG capsule Take one daily for 1 week, then one every other day until gone 14 capsule 0   eletriptan (RELPAX) 40 MG tablet Take 1 tablet (40 mg total) by mouth every 2 (two) hours as needed for migraine (May repeat in 2 hours if headache persists or recurs.). 10 tablet 0   fluconazole (DIFLUCAN) 150 MG tablet Take 1 tablet (150 mg total) by mouth every three (3) days as needed. 3 tablet 0   ketoconazole (NIZORAL) 2 % cream Apply 1 Application topically daily. 15 g 0   lisdexamfetamine (VYVANSE) 70 MG capsule Take 1 capsule (70 mg total) by mouth daily. 30 capsule 0   lisdexamfetamine (VYVANSE) 70 MG capsule Take 1 capsule (70 mg total) by mouth daily. 30 capsule 0   lisdexamfetamine (VYVANSE) 70 MG capsule Take 1 capsule (70 mg total) by mouth daily. 30 capsule 0   nystatin  (MYCOSTATIN/NYSTOP) powder Apply 1 Application topically 3 (three) times daily. 15 g 0   nystatin cream (MYCOSTATIN) Apply 1 Application topically 2 (two) times daily. 30 g 0   pramipexole (MIRAPEX) 1.5 MG tablet Take 1 tablet (1.5 mg total) by mouth at bedtime. 30 tablet 5   SPRINTEC 28 0.25-35 MG-MCG tablet Take 1 tablet by mouth at bedtime.     No current facility-administered medications for this visit.     Musculoskeletal: Strength & Muscle Tone: within normal limits Gait & Station: normal Patient leans: N/A  Psychiatric Specialty Exam: Review of Systems  Constitutional:  Positive for fatigue.  Musculoskeletal:  Positive for arthralgias and myalgias.  Psychiatric/Behavioral:  Positive for dysphoric mood.   All other systems reviewed and are negative.   There were no vitals taken for this visit.There is no height or weight on file to calculate BMI.  General Appearance: Casual, Neat, and Well Groomed  Eye Contact:  Good  Speech:  Clear and Coherent  Volume:  Normal  Mood:  Dysphoric  Affect:  Congruent  Thought Process:  Goal Directed  Orientation:  Full (Time, Place, and Person)  Thought Content: Rumination   Suicidal Thoughts:  No  Homicidal Thoughts:  No  Memory:  Immediate;   Good Recent;   Good Remote;   Good  Judgement:  Good  Insight:  Good  Psychomotor Activity:  Decreased  Concentration:  Concentration: Good and Attention Span: Good  Recall:  Good  Fund of Knowledge: Good  Language: Good  Akathisia:  No  Handed:  Right  AIMS (if indicated): not done  Assets:  Communication Skills Desire for Improvement Resilience Social Support Talents/Skills Vocational/Educational  ADL's:  Intact  Cognition: WNL  Sleep:  Fair   Screenings: GAD-7    Flowsheet Row Telemedicine from 06/07/2019 in [facility] Office Visit from 12/28/2017 in [facility]  Total GAD-7 Score 14 12      PHQ2-9    Flowsheet Row Video Visit from 03/15/2022  in [facility]  Behavioral Health at [facility] Video Visit from 01/11/2022 in [facility] Health Outpatient Behavioral Health at [place] Video Visit from 06/29/2021 in [facility] Outpatient Behavioral Health at [place] Video Visit from 04/29/2021 in [facility] Outpatient Behavioral Health at [place] Video Visit from 03/18/2021 in [facility] Outpatient Behavioral Health at [facility] Total Score 2 4 2 1 1   PHQ-9 Total Score 8 9 10  -- --      Flowsheet Row Video Visit from 01/11/2022 in [facility] Outpatient Behavioral Health at [place] Admission (Discharged) from 07/29/2021 in [facility] Video Visit from 06/29/2021 in [facility] Outpatient Behavioral Health at [facility] RISK CATEGORY No Risk No Risk No Risk        Assessment and Plan: This patient is a 52 year old female with a history of fibromyalgia and joint pain depression anxiety binge eating disorder and recent rash.  Since the rash persisted so long after stopping Cymbalta we determined that this was probably not the cause.  Therefore she will restart Cymbalta at 60 mg daily as it helped her fibromyalgia and depressive symptoms.  She will also continue Wellbutrin SR 150 mg twice daily for depression, Valium 5 mg twice daily as needed for anxiety and Vyvanse 70 mg every morning for ADD and binge eating disorder.  She will return to see me in 2 months  Collaboration of Care: Collaboration of Care: Primary Care Provider AEB notes will be shared with PCP at patient's request  Patient/Guardian was advised Release of Information must be obtained prior to any record release in order to collaborate their care with an outside provider. Patient/Guardian was advised if they have not already done so to contact the registration department to sign all necessary forms in order for [place] to release information regarding their care.   Consent: Patient/Guardian gives verbal consent for treatment and assignment of benefits for  services provided during this visit. Patient/Guardian expressed understanding and agreed to proceed.    Diannia Ruder, MD 11/08/2022, 10:00 AM

## 2022-11-28 ENCOUNTER — Telehealth (HOSPITAL_COMMUNITY): Payer: Self-pay | Admitting: *Deleted

## 2022-11-28 ENCOUNTER — Other Ambulatory Visit (HOSPITAL_COMMUNITY): Payer: Self-pay | Admitting: Psychiatry

## 2022-11-28 MED ORDER — LISDEXAMFETAMINE DIMESYLATE 70 MG PO CAPS: 70.0000 mg | ORAL_CAPSULE | Freq: Every day | ORAL | 0 refills | Status: DC

## 2022-11-28 NOTE — Telephone Encounter (Signed)
Patient called stating that her original pharmacy do not have her Vyvanse in stock. Per pt she would like for provider to please send script to [facility] [address] in [place].

## 2022-12-16 ENCOUNTER — Telehealth (HOSPITAL_COMMUNITY): Payer: Self-pay | Admitting: *Deleted

## 2022-12-16 ENCOUNTER — Other Ambulatory Visit (HOSPITAL_COMMUNITY): Payer: Self-pay | Admitting: Psychiatry

## 2022-12-16 MED ORDER — DULOXETINE HCL 30 MG PO CPEP: ORAL_CAPSULE | ORAL | 0 refills | Status: DC

## 2022-12-16 NOTE — Telephone Encounter (Signed)
LMOM

## 2022-12-16 NOTE — Telephone Encounter (Signed)
Patient called stating she is on Cymbalta and it is just not working. Per pt it's just not working at all and would like to taper back off of it. Patient number is [phone].

## 2022-12-20 ENCOUNTER — Telehealth: Payer: BC Managed Care – PPO | Admitting: Physician Assistant

## 2022-12-20 DIAGNOSIS — L03211 Cellulitis of face: Secondary | ICD-10-CM | POA: Diagnosis not present

## 2022-12-20 MED ORDER — DOXYCYCLINE HYCLATE 100 MG PO TABS
100.0000 mg | ORAL_TABLET | Freq: Two times a day (BID) | ORAL | 0 refills | Status: DC
Start: 2022-12-20 — End: 2023-03-03

## 2022-12-20 NOTE — Progress Notes (Signed)
E Visit for Cellulitis  We are sorry that you are not feeling well. Here is how we plan to help!  Based on what you shared with me it looks like you have cellulitis.  Cellulitis looks like areas of skin redness, swelling, and warmth; it develops as a result of bacteria entering under the skin. Little red spots and/or bleeding can be seen in skin, and tiny surface sacs containing fluid can occur. Fever can be present. Cellulitis is almost always on one side of a body, and the lower limbs are the most common site of involvement.   I have prescribed:  Doxycycline 100 mg twice daily for 7 days.   HOME CARE:  Take your medications as ordered and take all of them, even if the skin irritation appears to be healing.   GET HELP RIGHT AWAY IF:  Symptoms that don't begin to go away within 48 hours. Severe redness persists or worsens If the area turns color, spreads or swells. If it blisters and opens, develops yellow-brown crust or bleeds. You develop a fever or chills. If the pain increases or becomes unbearable.  Are unable to keep fluids and food down.  MAKE SURE YOU   Understand these instructions. Will watch your condition. Will get help right away if you are not doing well or get worse.  Thank you for choosing an e-visit.  Your e-visit answers were reviewed by a board certified advanced clinical practitioner to complete your personal care plan. Depending upon the condition, your plan could have included both over the counter or prescription medications.  Please review your pharmacy choice. Make sure the pharmacy is open so you can pick up prescription now. If there is a problem, you may contact your provider through [employer] and have the prescription routed to another pharmacy.  Your safety is important to [place]. If you have drug allergies check your prescription carefully.   For the next 24 hours you can use MyChart to ask questions about today's visit, request a non-urgent call  back, or ask for a work or school excuse. You will get an email in the next two days asking about your experience. I hope that your e-visit has been valuable and will speed your recovery.

## 2022-12-20 NOTE — Progress Notes (Signed)
I have spent 5 minutes in review of e-visit questionnaire, review and updating patient chart, medical decision making and response to patient.   Mia Milan Cody Jacklynn Dehaas, PA-C    

## 2023-01-02 ENCOUNTER — Telehealth (HOSPITAL_COMMUNITY): Payer: Self-pay | Admitting: *Deleted

## 2023-01-02 ENCOUNTER — Other Ambulatory Visit (HOSPITAL_COMMUNITY): Payer: Self-pay | Admitting: Psychiatry

## 2023-01-02 MED ORDER — LISDEXAMFETAMINE DIMESYLATE 70 MG PO CAPS: 70.0000 mg | ORAL_CAPSULE | Freq: Every day | ORAL | 0 refills | Status: DC

## 2023-01-02 NOTE — Telephone Encounter (Signed)
Patient called stating she is needing refills for her Vyvanse. Per pt she did not realized she was out until it was too late now she's out. Per pt she wouldlike it to go to the [facility] off of [address].

## 2023-01-02 NOTE — Telephone Encounter (Signed)
sent 

## 2023-01-03 ENCOUNTER — Telehealth (HOSPITAL_COMMUNITY): Payer: Self-pay | Admitting: *Deleted

## 2023-01-03 ENCOUNTER — Other Ambulatory Visit (HOSPITAL_COMMUNITY): Payer: Self-pay | Admitting: Psychiatry

## 2023-01-03 MED ORDER — LISDEXAMFETAMINE DIMESYLATE 70 MG PO CAPS: 70.0000 mg | ORAL_CAPSULE | Freq: Every day | ORAL | 0 refills | Status: DC

## 2023-01-03 NOTE — Telephone Encounter (Signed)
Per pt she would like for provider to please send her script to [facility] on [address]. Per pt [facility] don't always have it in stock.

## 2023-01-04 NOTE — Telephone Encounter (Signed)
LMOM

## 2023-01-11 ENCOUNTER — Telehealth (HOSPITAL_COMMUNITY): Payer: BC Managed Care – PPO | Admitting: Psychiatry

## 2023-01-16 ENCOUNTER — Ambulatory Visit (HOSPITAL_COMMUNITY)
Admission: RE | Admit: 2023-01-16 | Discharge: 2023-01-16 | Disposition: A | Discharge status: Home / Self Care | Payer: BC Managed Care – PPO | Source: Ambulatory Visit | Attending: Nurse Practitioner | Admitting: Nurse Practitioner

## 2023-01-16 ENCOUNTER — Other Ambulatory Visit (HOSPITAL_COMMUNITY): Payer: Self-pay | Admitting: Nurse Practitioner

## 2023-01-16 DIAGNOSIS — J069 Acute upper respiratory infection, unspecified: Secondary | ICD-10-CM | POA: Diagnosis present

## 2023-01-30 ENCOUNTER — Other Ambulatory Visit (HOSPITAL_COMMUNITY): Payer: Self-pay | Admitting: Family Medicine

## 2023-01-30 ENCOUNTER — Ambulatory Visit (HOSPITAL_COMMUNITY)
Admission: RE | Admit: 2023-01-30 | Discharge: 2023-01-30 | Disposition: A | Discharge status: Home / Self Care | Payer: BC Managed Care – PPO | Source: Ambulatory Visit | Attending: Family Medicine | Admitting: Family Medicine

## 2023-01-30 DIAGNOSIS — R0600 Dyspnea, unspecified: Secondary | ICD-10-CM | POA: Diagnosis present

## 2023-02-16 ENCOUNTER — Telehealth (HOSPITAL_COMMUNITY): Payer: Self-pay | Admitting: *Deleted

## 2023-02-16 ENCOUNTER — Other Ambulatory Visit (HOSPITAL_COMMUNITY): Payer: Self-pay | Admitting: Psychiatry

## 2023-02-16 MED ORDER — LISDEXAMFETAMINE DIMESYLATE 70 MG PO CAPS: 70.0000 mg | ORAL_CAPSULE | Freq: Every day | ORAL | 0 refills | Status: DC

## 2023-02-16 NOTE — Telephone Encounter (Signed)
 Patient called stating she is needing refills for her Vyvanse  but did not lease the name of the pharmacy she would like the script to be sent to. Called patient to get information and was not able to reach her. Staff left message for patient to call office back with information. Office number provided.

## 2023-02-16 NOTE — Telephone Encounter (Signed)
 sent

## 2023-02-16 NOTE — Telephone Encounter (Signed)
 Patient called and stated she is needing refills for her Vyvanse to be sent to [facility]. Per pt pharmacy has the name brand in stock.

## 2023-02-16 NOTE — Telephone Encounter (Signed)
 LMOM

## 2023-02-24 ENCOUNTER — Emergency Department (HOSPITAL_COMMUNITY)
Admission: EM | Admit: 2023-02-24 | Discharge: 2023-02-24 | Disposition: A | Discharge status: Home / Self Care | Payer: 59 | Attending: Emergency Medicine | Admitting: Emergency Medicine

## 2023-02-24 ENCOUNTER — Other Ambulatory Visit: Payer: Self-pay

## 2023-02-24 ENCOUNTER — Emergency Department (HOSPITAL_COMMUNITY): Payer: 59

## 2023-02-24 ENCOUNTER — Encounter (HOSPITAL_COMMUNITY): Payer: Self-pay

## 2023-02-24 DIAGNOSIS — R1031 Right lower quadrant pain: Secondary | ICD-10-CM | POA: Insufficient documentation

## 2023-02-24 DIAGNOSIS — R109 Unspecified abdominal pain: Secondary | ICD-10-CM

## 2023-02-24 DIAGNOSIS — R1011 Right upper quadrant pain: Secondary | ICD-10-CM | POA: Diagnosis not present

## 2023-02-24 LAB — URINALYSIS, ROUTINE W REFLEX MICROSCOPIC
Bacteria, UA: NONE SEEN
Bilirubin Urine: NEGATIVE
Glucose, UA: NEGATIVE mg/dL
Hgb urine dipstick: NEGATIVE
Ketones, ur: NEGATIVE mg/dL
Nitrite: NEGATIVE
Protein, ur: NEGATIVE mg/dL
Specific Gravity, Urine: 1.005 (ref 1.005–1.030)
pH: 7 (ref 5.0–8.0)

## 2023-02-24 LAB — CBC WITH DIFFERENTIAL/PLATELET
Abs Immature Granulocytes: 0.03 10*3/uL (ref 0.00–0.07)
Basophils Absolute: 0.1 10*3/uL (ref 0.0–0.1)
Basophils Relative: 1 %
Eosinophils Absolute: 0.4 10*3/uL (ref 0.0–0.5)
Eosinophils Relative: 4 %
HCT: 41.8 % (ref 36.0–46.0)
Hemoglobin: 14 g/dL (ref 12.0–15.0)
Immature Granulocytes: 0 %
Lymphocytes Relative: 26 %
Lymphs Abs: 3 10*3/uL (ref 0.7–4.0)
MCH: 29.7 pg (ref 26.0–34.0)
MCHC: 33.5 g/dL (ref 30.0–36.0)
MCV: 88.6 fL (ref 80.0–100.0)
Monocytes Absolute: 1.1 10*3/uL — ABNORMAL HIGH (ref 0.1–1.0)
Monocytes Relative: 9 %
Neutro Abs: 7 10*3/uL (ref 1.7–7.7)
Neutrophils Relative %: 60 %
Platelets: 316 10*3/uL (ref 150–400)
RBC: 4.72 MIL/uL (ref 3.87–5.11)
RDW: 14.1 % (ref 11.5–15.5)
WBC: 11.6 10*3/uL — ABNORMAL HIGH (ref 4.0–10.5)
nRBC: 0 % (ref 0.0–0.2)

## 2023-02-24 LAB — BASIC METABOLIC PANEL
Anion gap: 7 (ref 5–15)
BUN: 8 mg/dL (ref 6–20)
CO2: 26 mmol/L (ref 22–32)
Calcium: 9.2 mg/dL (ref 8.9–10.3)
Chloride: 106 mmol/L (ref 98–111)
Creatinine, Ser: 0.73 mg/dL (ref 0.44–1.00)
GFR, Estimated: >60 mL/min (ref >60)
Glucose, Bld: 112 mg/dL — ABNORMAL HIGH (ref 70–99)
Potassium: 3.2 mmol/L — ABNORMAL LOW (ref 3.5–5.1)
Sodium: 139 mmol/L (ref 135–145)

## 2023-02-24 MED ORDER — ONDANSETRON HCL 4 MG/2ML IJ SOLN
4.0000 mg | Freq: Once | INTRAMUSCULAR | Status: AC
Start: 2023-02-24 — End: 2023-02-24
  Administered 2023-02-24: 4 mg via INTRAVENOUS
  Filled 2023-02-24: qty 2

## 2023-02-24 MED ORDER — HYDROCODONE-ACETAMINOPHEN 5-325 MG PO TABS: 1.0000 | ORAL_TABLET | Freq: Four times a day (QID) | ORAL | 0 refills | Status: DC | PRN

## 2023-02-24 MED ORDER — MORPHINE SULFATE (PF) 4 MG/ML IV SOLN
4.0000 mg | Freq: Once | INTRAVENOUS | Status: AC
Start: 2023-02-24 — End: 2023-02-24
  Administered 2023-02-24: 4 mg via INTRAVENOUS
  Filled 2023-02-24: qty 1

## 2023-02-24 MED ORDER — KETOROLAC TROMETHAMINE 30 MG/ML IJ SOLN
30.0000 mg | Freq: Once | INTRAMUSCULAR | Status: AC
Start: 2023-02-24 — End: 2023-02-24
  Administered 2023-02-24: 30 mg via INTRAVENOUS
  Filled 2023-02-24: qty 1

## 2023-02-24 NOTE — ED Provider Notes (Signed)
[place] EMERGENCY DEPARTMENT AT [facility] Provider Note   CSN: [id number] Arrival date & time: 02/24/23  0007     History  Chief Complaint  Patient presents with   Muscle Pain    Right side      Sheri Wood is a 53 y.o. female.  Patient is a 53 year old female with past medical history of fibromyalgia, depression, anxiety, migraines, restless legs.  Patient presenting today for evaluation of right flank pain.  This started earlier today shortly after coughing forcefully.  She describes a spasm to her right flank that radiates into her right lower abdomen.  She denies any abdominal or urinary complaints.  She denies any difficulty breathing.  No fevers or chills.  She does state that she has had a cough for the past 2 months.  The history is provided by the patient.       Home Medications Prior to Admission medications   Medication Sig Start Date End Date Taking? Authorizing Provider  buPROPion (WELLBUTRIN SR) 150 MG 12 hr tablet Take 1 tablet (150 mg total) by mouth 2 (two) times daily. 11/08/22 11/08/23  Myrlene Broker, MD  cyclobenzaprine (FLEXERIL) 10 MG tablet Take 1 tablet (10 mg total) by mouth 3 (three) times daily as needed for muscle spasms. Will cause drowsiness 01/03/22   Viviano Simas, FNP  diazepam (VALIUM) 5 MG tablet Take 1 tablet (5 mg total) by mouth 2 (two) times daily as needed. 11/08/22   Myrlene Broker, MD  doxycycline (VIBRA-TABS) 100 MG tablet Take 1 tablet (100 mg total) by mouth 2 (two) times daily. 12/20/22   Waldon Merl, PA-C  DULoxetine (CYMBALTA) 30 MG capsule Take one daily for 1 week, then one every other day until gone 12/16/22   Myrlene Broker, MD  DULoxetine (CYMBALTA) 60 MG capsule Take 1 capsule (60 mg total) by mouth daily. 11/08/22 11/08/23  Myrlene Broker, MD  eletriptan (RELPAX) 40 MG tablet Take 1 tablet (40 mg total) by mouth every 2 (two) hours as needed for migraine (May repeat in 2 hours if headache persists or  recurs.). 05/15/21   Daphine Deutscher, Mary-Margaret, FNP  fluconazole (DIFLUCAN) 150 MG tablet Take 1 tablet (150 mg total) by mouth every three (3) days as needed. 05/15/22   Junie Spencer, FNP  ketoconazole (NIZORAL) 2 % cream Apply 1 Application topically daily. 05/15/22   Junie Spencer, FNP  lisdexamfetamine (VYVANSE) 70 MG capsule Take 1 capsule (70 mg total) by mouth daily. 11/08/22   Myrlene Broker, MD  lisdexamfetamine (VYVANSE) 70 MG capsule Take 1 capsule (70 mg total) by mouth daily. 11/28/22   Myrlene Broker, MD  lisdexamfetamine (VYVANSE) 70 MG capsule Take 1 capsule (70 mg total) by mouth daily. 02/16/23   Myrlene Broker, MD  nystatin (MYCOSTATIN/NYSTOP) powder Apply 1 Application topically 3 (three) times daily. 05/15/22   Junie Spencer, FNP  nystatin cream (MYCOSTATIN) Apply 1 Application topically 2 (two) times daily. 05/02/22   Freddy Finner, NP  pramipexole (MIRAPEX) 1.5 MG tablet Take 1 tablet (1.5 mg total) by mouth at bedtime. 02/04/20   Campbell Riches, NP  SPRINTEC 28 0.25-35 MG-MCG tablet Take 1 tablet by mouth at bedtime. 03/06/20   [provider]      Allergies    Ciprofloxacin, Cefzil [cefprozil], Augmentin [amoxicillin-pot clavulanate], and Bee venom    Review of Systems   Review of Systems  All other systems reviewed and are negative.  Physical Exam Updated Vital Signs BP (!) 144/129   Pulse (!) 105   Temp 97.6 F (36.4 C)   Resp 20   Ht 5\' 7"  (1.702 m)   Wt 104.8 kg   SpO2 95%   BMI 36.18 kg/m  Physical Exam Vitals and nursing note reviewed.  Constitutional:      General: She is not in acute distress.    Appearance: She is well-developed. She is not diaphoretic.  HENT:     Head: Normocephalic and atraumatic.  Cardiovascular:     Rate and Rhythm: Normal rate and regular rhythm.     Heart sounds: No murmur heard.    No friction rub. No gallop.  Pulmonary:     Effort: Pulmonary effort is normal. No respiratory distress.     Breath sounds:  Normal breath sounds. No wheezing.  Abdominal:     General: Bowel sounds are normal. There is no distension.     Palpations: Abdomen is soft.     Tenderness: There is abdominal tenderness. There is right CVA tenderness. There is no left CVA tenderness, guarding or rebound.     Comments: There is tenderness to palpation in the right upper quadrant, right lower quadrant, and right flank.  Musculoskeletal:        General: Normal range of motion.     Cervical back: Normal range of motion and neck supple.  Skin:    General: Skin is warm and dry.  Neurological:     General: No focal deficit present.     Mental Status: She is alert and oriented to person, place, and time.     ED Results / Procedures / Treatments   Labs (all labs ordered are listed, but only abnormal results are displayed) Labs Reviewed  BASIC METABOLIC PANEL  CBC WITH DIFFERENTIAL/PLATELET  URINALYSIS, ROUTINE W REFLEX MICROSCOPIC    EKG None  Radiology No results found.  Procedures Procedures    Medications Ordered in ED Medications  ondansetron (ZOFRAN) injection 4 mg (has no administration in time range)  morphine (PF) 4 MG/ML injection 4 mg (has no administration in time range)  ketorolac (TORADOL) 30 MG/ML injection 30 mg (has no administration in time range)    ED Course/ Medical Decision Making/ A&P  Patient is a 53 year old female presenting with complaints of severe right flank pain that seem to begin after an episode of forceful coughing.  Patient arrives here very uncomfortable, but with stable vital signs and is afebrile.  Physical examination reveals tenderness to the right flank and right upper and right lower quadrants.  Laboratory studies obtained including CBC, metabolic panel, and urinalysis, all of which are unremarkable.  Urinalysis is clear with no evidence for infection.  CT with renal protocol obtained showing no evidence for renal calculus or other intra-abdominal process.  Chest  x-ray shows no pneumothorax or evidence for rib fracture or infiltrate.  Patient has received Toradol and morphine for pain along with Zofran for nausea and seems to be feeling considerably improved.  At this point, I feel as though she can safely be discharged.  Symptoms most likely related to a musculoskeletal etiology.  She will be discharged with NSAIDs and hydrocodone.  Final Clinical Impression(s) / ED Diagnoses Final diagnoses:  None    Rx / DC Orders ED Discharge Orders     None         Geoffery Lyons, MD 02/24/23 0202

## 2023-02-24 NOTE — Discharge Instructions (Addendum)
Begin taking ibuprofen 600 mg every 6 hours as needed for pain.  Begin taking hydrocodone as prescribed as needed for pain not relieved with ibuprofen.  Follow-up with primary doctor if not improving in the next week, and return to the ER if symptoms significantly worsen or change.

## 2023-02-24 NOTE — ED Triage Notes (Addendum)
Pov from home cc of right flank/back pain that started after she coughed really hard. - says that she has had a cough for 2 months.  Hurts with movement. C /o spasms.  Started yesterday but getting worse Unable to get relief even with muscle relaxers.  Took ibuprofen and muscle relaxer around 730pm

## 2023-02-27 MED FILL — Hydrocodone-Acetaminophen Tab 5-325 MG: ORAL | Qty: 6 | Status: AC

## 2023-02-28 ENCOUNTER — Other Ambulatory Visit (HOSPITAL_COMMUNITY): Payer: Self-pay | Admitting: Radiology

## 2023-02-28 DIAGNOSIS — R0600 Dyspnea, unspecified: Secondary | ICD-10-CM

## 2023-03-03 ENCOUNTER — Emergency Department (HOSPITAL_COMMUNITY): Payer: 59

## 2023-03-03 ENCOUNTER — Encounter (HOSPITAL_COMMUNITY): Payer: Self-pay

## 2023-03-03 ENCOUNTER — Other Ambulatory Visit: Payer: Self-pay

## 2023-03-03 ENCOUNTER — Emergency Department (HOSPITAL_COMMUNITY)
Admission: EM | Admit: 2023-03-03 | Discharge: 2023-03-03 | Disposition: A | Discharge status: Home / Self Care | Payer: 59 | Attending: Emergency Medicine | Admitting: Emergency Medicine

## 2023-03-03 DIAGNOSIS — R0602 Shortness of breath: Secondary | ICD-10-CM | POA: Diagnosis present

## 2023-03-03 DIAGNOSIS — Z20822 Contact with and (suspected) exposure to covid-19: Secondary | ICD-10-CM | POA: Insufficient documentation

## 2023-03-03 DIAGNOSIS — J40 Bronchitis, not specified as acute or chronic: Secondary | ICD-10-CM | POA: Diagnosis not present

## 2023-03-03 LAB — CBC WITH DIFFERENTIAL/PLATELET
Abs Immature Granulocytes: 0.07 10*3/uL (ref 0.00–0.07)
Basophils Absolute: 0.1 10*3/uL (ref 0.0–0.1)
Basophils Relative: 1 %
Eosinophils Absolute: 0.8 10*3/uL — ABNORMAL HIGH (ref 0.0–0.5)
Eosinophils Relative: 5 %
HCT: 43.6 % (ref 36.0–46.0)
Hemoglobin: 14.4 g/dL (ref 12.0–15.0)
Immature Granulocytes: 0 %
Lymphocytes Relative: 18 %
Lymphs Abs: 2.9 10*3/uL (ref 0.7–4.0)
MCH: 29.6 pg (ref 26.0–34.0)
MCHC: 33 g/dL (ref 30.0–36.0)
MCV: 89.7 fL (ref 80.0–100.0)
Monocytes Absolute: 1.1 10*3/uL — ABNORMAL HIGH (ref 0.1–1.0)
Monocytes Relative: 7 %
Neutro Abs: 11.1 10*3/uL — ABNORMAL HIGH (ref 1.7–7.7)
Neutrophils Relative %: 69 %
Platelets: 303 10*3/uL (ref 150–400)
RBC: 4.86 MIL/uL (ref 3.87–5.11)
RDW: 13.8 % (ref 11.5–15.5)
WBC: 15.9 10*3/uL — ABNORMAL HIGH (ref 4.0–10.5)
nRBC: 0 % (ref 0.0–0.2)

## 2023-03-03 LAB — RESP PANEL BY RT-PCR (RSV, FLU A&B, COVID)  RVPGX2
Influenza A by PCR: NEGATIVE
Influenza B by PCR: NEGATIVE
Resp Syncytial Virus by PCR: NEGATIVE
SARS Coronavirus 2 by RT PCR: NEGATIVE

## 2023-03-03 LAB — BRAIN NATRIURETIC PEPTIDE: B Natriuretic Peptide: 21 pg/mL (ref 0.0–100.0)

## 2023-03-03 LAB — BASIC METABOLIC PANEL
Anion gap: 12 (ref 5–15)
BUN: 10 mg/dL (ref 6–20)
CO2: 26 mmol/L (ref 22–32)
Calcium: 9.4 mg/dL (ref 8.9–10.3)
Chloride: 104 mmol/L (ref 98–111)
Creatinine, Ser: 0.81 mg/dL (ref 0.44–1.00)
GFR, Estimated: >60 mL/min (ref >60)
Glucose, Bld: 120 mg/dL — ABNORMAL HIGH (ref 70–99)
Potassium: 4 mmol/L (ref 3.5–5.1)
Sodium: 142 mmol/L (ref 135–145)

## 2023-03-03 MED ORDER — ALBUTEROL SULFATE HFA 108 (90 BASE) MCG/ACT IN AERS: 2.0000 | INHALATION_SPRAY | RESPIRATORY_TRACT | Status: DC

## 2023-03-03 MED ORDER — ALBUTEROL SULFATE (2.5 MG/3ML) 0.083% IN NEBU
INHALATION_SOLUTION | RESPIRATORY_TRACT | Status: AC
  Administered 2023-03-03: 2.5 mg
  Filled 2023-03-03: qty 3

## 2023-03-03 MED ORDER — DEXAMETHASONE SODIUM PHOSPHATE 10 MG/ML IJ SOLN
10.0000 mg | Freq: Once | INTRAMUSCULAR | Status: AC
  Administered 2023-03-03: 10 mg via INTRAVENOUS
  Filled 2023-03-03: qty 1

## 2023-03-03 MED ORDER — ALBUTEROL SULFATE (2.5 MG/3ML) 0.083% IN NEBU: 2.5000 mg | INHALATION_SOLUTION | Freq: Once | RESPIRATORY_TRACT | Status: DC

## 2023-03-03 MED ORDER — ALBUTEROL SULFATE HFA 108 (90 BASE) MCG/ACT IN AERS
2.0000 | INHALATION_SPRAY | RESPIRATORY_TRACT | Status: DC | PRN
  Administered 2023-03-03: 2 via RESPIRATORY_TRACT
  Filled 2023-03-03 (×2): qty 6.7

## 2023-03-03 MED ORDER — AEROCHAMBER PLUS FLO-VU MEDIUM MISC
1.0000 | Freq: Once | Status: AC
  Administered 2023-03-03: 1
  Filled 2023-03-03: qty 1

## 2023-03-03 MED ORDER — ALBUTEROL SULFATE (2.5 MG/3ML) 0.083% IN NEBU
5.0000 mg | INHALATION_SOLUTION | Freq: Once | RESPIRATORY_TRACT | Status: AC
  Administered 2023-03-03: 5 mg via RESPIRATORY_TRACT
  Filled 2023-03-03: qty 6

## 2023-03-03 MED ORDER — AZITHROMYCIN 250 MG PO TABS
250.0000 mg | ORAL_TABLET | Freq: Every day | ORAL | 0 refills | Status: DC
Start: 2023-03-03 — End: 2023-03-24

## 2023-03-03 MED ORDER — IPRATROPIUM-ALBUTEROL 0.5-2.5 (3) MG/3ML IN SOLN
RESPIRATORY_TRACT | Status: AC
  Administered 2023-03-03: 3 mL
  Filled 2023-03-03: qty 3

## 2023-03-03 MED ORDER — IPRATROPIUM-ALBUTEROL 0.5-2.5 (3) MG/3ML IN SOLN: 3.0000 mL | Freq: Once | RESPIRATORY_TRACT | Status: DC

## 2023-03-03 MED ORDER — PREDNISONE 20 MG PO TABS
40.0000 mg | ORAL_TABLET | Freq: Every day | ORAL | 0 refills | Status: DC
Start: 2023-03-03 — End: 2023-03-24

## 2023-03-03 NOTE — ED Provider Notes (Signed)
[place] EMERGENCY DEPARTMENT AT [facility] Provider Note   CSN: [id number] Arrival date & time: 03/03/23  0104     History  Chief Complaint  Patient presents with   Shortness of Breath    Sheri Wood is a 53 y.o. female.  Presents to the emergency department for evaluation of shortness of breath.  She has had some cough and congestion for a number of days but her breathing significantly worsened today.  She denies a history of asthma.       Home Medications Prior to Admission medications   Medication Sig Start Date End Date Taking? Authorizing Provider  azithromycin (ZITHROMAX) 250 MG tablet Take 1 tablet (250 mg total) by mouth daily. Take first 2 tablets together, then 1 every day until finished. 03/03/23  Yes Zhanae Proffit, Canary Brim, MD  predniSONE (DELTASONE) 20 MG tablet Take 2 tablets (40 mg total) by mouth daily with breakfast. 03/03/23  Yes Roxana Lai, Canary Brim, MD  buPROPion (WELLBUTRIN SR) 150 MG 12 hr tablet Take 1 tablet (150 mg total) by mouth 2 (two) times daily. 11/08/22 11/08/23  Myrlene Broker, MD  cyclobenzaprine (FLEXERIL) 10 MG tablet Take 1 tablet (10 mg total) by mouth 3 (three) times daily as needed for muscle spasms. Will cause drowsiness 01/03/22   Viviano Simas, FNP  diazepam (VALIUM) 5 MG tablet Take 1 tablet (5 mg total) by mouth 2 (two) times daily as needed. 11/08/22   Myrlene Broker, MD  DULoxetine (CYMBALTA) 30 MG capsule Take one daily for 1 week, then one every other day until gone 12/16/22   Myrlene Broker, MD  DULoxetine (CYMBALTA) 60 MG capsule Take 1 capsule (60 mg total) by mouth daily. 11/08/22 11/08/23  Myrlene Broker, MD  eletriptan (RELPAX) 40 MG tablet Take 1 tablet (40 mg total) by mouth every 2 (two) hours as needed for migraine (May repeat in 2 hours if headache persists or recurs.). 05/15/21   Daphine Deutscher, Mary-Margaret, FNP  fluconazole (DIFLUCAN) 150 MG tablet Take 1 tablet (150 mg total) by mouth every three (3) days as  needed. 05/15/22   Junie Spencer, FNP  HYDROcodone-acetaminophen (NORCO) 5-325 MG tablet Take 1-2 tablets by mouth every 6 (six) hours as needed. 02/24/23   Geoffery Lyons, MD  HYDROcodone-acetaminophen (NORCO) 5-325 MG tablet Take 1-2 tablets by mouth every 6 (six) hours as needed. 02/24/23   Geoffery Lyons, MD  ketoconazole (NIZORAL) 2 % cream Apply 1 Application topically daily. 05/15/22   Junie Spencer, FNP  lisdexamfetamine (VYVANSE) 70 MG capsule Take 1 capsule (70 mg total) by mouth daily. 11/08/22   Myrlene Broker, MD  lisdexamfetamine (VYVANSE) 70 MG capsule Take 1 capsule (70 mg total) by mouth daily. 11/28/22   Myrlene Broker, MD  lisdexamfetamine (VYVANSE) 70 MG capsule Take 1 capsule (70 mg total) by mouth daily. 02/16/23   Myrlene Broker, MD  nystatin (MYCOSTATIN/NYSTOP) powder Apply 1 Application topically 3 (three) times daily. 05/15/22   Junie Spencer, FNP  nystatin cream (MYCOSTATIN) Apply 1 Application topically 2 (two) times daily. 05/02/22   Freddy Finner, NP  pramipexole (MIRAPEX) 1.5 MG tablet Take 1 tablet (1.5 mg total) by mouth at bedtime. 02/04/20   Campbell Riches, NP  SPRINTEC 28 0.25-35 MG-MCG tablet Take 1 tablet by mouth at bedtime. 03/06/20   [provider]      Allergies    Ciprofloxacin, Cefzil [cefprozil], Augmentin [amoxicillin-pot clavulanate], and Bee venom    Review  of Systems   Review of Systems  Physical Exam Updated Vital Signs BP 136/80   Pulse 91   Temp 97.9 F (36.6 C)   Resp 14   Ht 5\' 7"  (1.702 m)   Wt 104.8 kg   SpO2 98%   BMI 36.18 kg/m  Physical Exam Vitals and nursing note reviewed.  Constitutional:      General: She is not in acute distress.    Appearance: She is well-developed.  HENT:     Head: Normocephalic and atraumatic.     Mouth/Throat:     Mouth: Mucous membranes are moist.  Eyes:     General: Vision grossly intact. Gaze aligned appropriately.     Extraocular Movements: Extraocular movements intact.      Conjunctiva/sclera: Conjunctivae normal.  Cardiovascular:     Rate and Rhythm: Normal rate and regular rhythm.     Pulses: Normal pulses.     Heart sounds: Normal heart sounds, S1 normal and S2 normal. No murmur heard.    No friction rub. No gallop.  Pulmonary:     Effort: Tachypnea and accessory muscle usage present. No respiratory distress.     Breath sounds: Decreased breath sounds and wheezing present.  Abdominal:     General: Bowel sounds are normal.     Palpations: Abdomen is soft.     Tenderness: There is no abdominal tenderness. There is no guarding or rebound.     Hernia: No hernia is present.  Musculoskeletal:        General: No swelling.     Cervical back: Full passive range of motion without pain, normal range of motion and neck supple. No spinous process tenderness or muscular tenderness. Normal range of motion.     Right lower leg: No edema.     Left lower leg: No edema.  Skin:    General: Skin is warm and dry.     Capillary Refill: Capillary refill takes less than 2 seconds.     Findings: No ecchymosis, erythema, rash or wound.  Neurological:     General: No focal deficit present.     Mental Status: She is alert and oriented to person, place, and time.     GCS: GCS eye subscore is 4. GCS verbal subscore is 5. GCS motor subscore is 6.     Cranial Nerves: Cranial nerves 2-12 are intact.     Sensory: Sensation is intact.     Motor: Motor function is intact.     Coordination: Coordination is intact.  Psychiatric:        Attention and Perception: Attention normal.        Mood and Affect: Mood normal.        Speech: Speech normal.        Behavior: Behavior normal.     ED Results / Procedures / Treatments   Labs (all labs ordered are listed, but only abnormal results are displayed) Labs Reviewed  CBC WITH DIFFERENTIAL/PLATELET - Abnormal; Notable for the following components:      Result Value   WBC 15.9 (*)    Neutro Abs 11.1 (*)    Monocytes Absolute 1.1 (*)     Eosinophils Absolute 0.8 (*)    All other components within normal limits  BASIC METABOLIC PANEL - Abnormal; Notable for the following components:   Glucose, Bld 120 (*)    All other components within normal limits  RESP PANEL BY RT-PCR (RSV, FLU A&B, COVID)  RVPGX2  BRAIN NATRIURETIC PEPTIDE  EKG EKG Interpretation Date/Time:  Friday March 03 2023 01:25:40 EST Ventricular Rate:  102 PR Interval:  146 QRS Duration:  68 QT Interval:  380 QTC Calculation: 495 R Axis:   78  Text Interpretation: Sinus tachycardia Biatrial enlargement Borderline prolonged QT interval Confirmed by Gilda Crease [phone]) on 03/03/2023 1:32:35 AM  Radiology DG Chest Port 1 View Result Date: 03/03/2023 CLINICAL DATA:  Dyspnea EXAM: PORTABLE CHEST 1 VIEW COMPARISON:  None Available. FINDINGS: The heart size and mediastinal contours are within normal limits. Both lungs are clear. The visualized skeletal structures are unremarkable. IMPRESSION: No active disease. Electronically Signed   By: Helyn Numbers M.D.   On: 03/03/2023 02:05    Procedures Procedures    Medications Ordered in ED Medications  albuterol (VENTOLIN HFA) 108 (90 Base) MCG/ACT inhaler 2 puff (has no administration in time range)  dexamethasone (DECADRON) injection 10 mg (has no administration in time range)  albuterol (PROVENTIL) (2.5 MG/3ML) 0.083% nebulizer solution 5 mg (has no administration in time range)  ipratropium-albuterol (DUONEB) 0.5-2.5 (3) MG/3ML nebulizer solution (3 mLs  Given 03/03/23 0136)  albuterol (PROVENTIL) (2.5 MG/3ML) 0.083% nebulizer solution (2.5 mg  Given 03/03/23 0136)    ED Course/ Medical Decision Making/ A&P                                 Medical Decision Making Amount and/or Complexity of Data Reviewed Labs: ordered. Radiology: ordered.  Risk Prescription drug management.   Patient presents to the emergency department for evaluation of difficulty breathing.  Patient has acute  wheezing and bronchospasm apparent on arrival.  She is not requiring supplemental oxygen.  Patient denies history of reactive airway disease.  She reports that she had RSV around Thanksgiving and has had some breathing difficulties since.  Patient reports improvement after albuterol nebulizer treatment.  COVID, influenza, RSV negative.  Chest x-ray does not show any evidence of pneumonia.  Patient has maintain oxygen saturations of 97 to 98% on room air.  Presentation consistent with acute bronchospasm, secondary to upper respiratory infection.  Will treat with steroids, albuterol, Zithromax, follow-up.  Given return precautions.        Final Clinical Impression(s) / ED Diagnoses Final diagnoses:  Bronchitis    Rx / DC Orders ED Discharge Orders          Ordered    predniSONE (DELTASONE) 20 MG tablet  Daily with breakfast        03/03/23 0324    azithromycin (ZITHROMAX) 250 MG tablet  Daily        03/03/23 0324              Gilda Crease, MD 03/03/23 [phone]

## 2023-03-03 NOTE — ED Triage Notes (Signed)
Pov from home. Cc of sob since today. Audible wheezing heard. Used albuterol inhaler around 9pm.

## 2023-03-03 NOTE — ED Notes (Signed)
Reviewed D/C information with the patient, pt verbalized understanding. No additional concerns at this time.

## 2023-03-03 NOTE — ED Notes (Signed)
RT contacted about the patients increase wheezing and WOB.

## 2023-03-06 ENCOUNTER — Ambulatory Visit: Payer: BC Managed Care – PPO | Admitting: Dermatology

## 2023-03-10 ENCOUNTER — Encounter (HOSPITAL_COMMUNITY): Payer: Self-pay | Admitting: Psychiatry

## 2023-03-10 ENCOUNTER — Telehealth (INDEPENDENT_AMBULATORY_CARE_PROVIDER_SITE_OTHER): Payer: 59 | Admitting: Psychiatry

## 2023-03-10 DIAGNOSIS — F900 Attention-deficit hyperactivity disorder, predominantly inattentive type: Secondary | ICD-10-CM

## 2023-03-10 DIAGNOSIS — F5081 Binge eating disorder, mild: Secondary | ICD-10-CM

## 2023-03-10 DIAGNOSIS — F418 Other specified anxiety disorders: Secondary | ICD-10-CM

## 2023-03-10 MED ORDER — LISDEXAMFETAMINE DIMESYLATE 70 MG PO CAPS: 70.0000 mg | ORAL_CAPSULE | Freq: Every day | ORAL | 0 refills | Status: DC

## 2023-03-10 MED ORDER — FLUOXETINE HCL 20 MG PO CAPS: 20.0000 mg | ORAL_CAPSULE | Freq: Every day | ORAL | 2 refills | Status: DC

## 2023-03-10 MED ORDER — BUPROPION HCL ER (SR) 150 MG PO TB12: 150.0000 mg | ORAL_TABLET | Freq: Two times a day (BID) | ORAL | 5 refills | Status: DC

## 2023-03-10 NOTE — Progress Notes (Signed)
Virtual Visit via Video Note  I connected with Sheri Wood on 03/10/23 at 11:20 AM EST by a video enabled telemedicine application and verified that I am speaking with the correct person using two identifiers.  Location: Patient: home Provider: office   I discussed the limitations of evaluation and management by telemedicine and the availability of in person appointments. The patient expressed understanding and agreed to proceed.     I discussed the assessment and treatment plan with the patient. The patient was provided an opportunity to ask questions and all were answered. The patient agreed with the plan and demonstrated an understanding of the instructions.   The patient was advised to call back or seek an in-person evaluation if the symptoms worsen or if the condition fails to improve as anticipated.  I provided 20 minutes of non-face-to-face time during this encounter.   Diannia Ruder, MD  [facility] MD/PA/NP OP Progress Note  03/10/2023 11:34 AM Sheri Wood  MRN:  [id number]  Chief Complaint:  Chief Complaint  Patient presents with   ADD   Depression   Follow-up   HPI: This patient is a 53 year old married white female who lives with her husband in [place]. She is a Patent examiner for [employer]   Returns for follow-up after 3 months regarding her depression anxiety ADD and binge eating disorder.  She also has chronic fatigue fibromyalgia and chronic pain.  She states now her PCP thinks she might have Sjogren's syndrome.  When I last saw her 3 months ago she wanted to retry the Cymbalta even though it caused rash.  Apparently she did not do so but instead has gone back to Prozac 20 mg that she had at home.  She states that she began to get depressed again over the holidays and missing her mother and is she is also dealing with her husband's chronic illnesses.  She has been on it about a month and does think that has helped her depression as  well as OCD symptoms.  She continues to overeat and has gained 30 pounds.  The Vyvanse helps with the fatigue but it is not helping curb her appetite.  I told her to talk to her PCP about getting on a GLP-1 if insurance will cover it.  Her mood is better now that she is on the Prozac and she denies any thoughts of self-harm or suicide. Visit Diagnosis:    ICD-10-CM   1. Depression with anxiety  F41.8     2. Attention deficit hyperactivity disorder (ADHD), predominantly inattentive type  F90.0     3. 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       Past Psychiatric History: Long-term outpatient treatment  Past Medical History:  Past Medical History:  Diagnosis Date   Allergy    seasonal   Anxiety    Depression    Fibromyalgia    Gastritis    history   GERD (gastroesophageal reflux disease)    otc med prn   IBS (irritable bowel syndrome)    Migraine    SVD (spontaneous vaginal delivery)    x 2   Urticaria, chronic    Vertigo     Past Surgical History:  Procedure Laterality Date   CARPOMETACARPEL SUSPENSION PLASTY Right 07/29/2021   Procedure: RIGHT THUMB TRAPEZIECTOMY AND SUSPENSIONPLASTY;  Surgeon: Betha Loa, MD;  Location: [facility];  Service: Orthopedics;  Laterality: Right;  75 MIN   CHOLECYSTECTOMY N/A 04/08/2020   Procedure: LAPAROSCOPIC CHOLECYSTECTOMY;  Surgeon: [name], MD;  Location: [facility];  Service: General;  Laterality: N/A;   COLONOSCOPY  2008   negative   DIAGNOSTIC LAPAROSCOPY     fibroids   DILATION AND CURETTAGE OF UTERUS     DILITATION & CURRETTAGE/HYSTROSCOPY WITH NOVASURE ABLATION N/A 05/06/2013   Procedure: DILATATION & CURETTAGE/HYSTEROSCOPY WITH NOVASURE ABLATION;  Surgeon: Alphonsus Sias. Ernestina Penna, MD;  Location: [facility];  Service: Gynecology;  Laterality: N/A;   EYE SURGERY     bilateral lasik   FOOT SURGERY  2017   LAPAROSCOPIC BILATERAL SALPINGECTOMY Bilateral 05/06/2013   Procedure: LAPAROSCOPIC BILATERAL SALPINGECTOMY;  Surgeon:  Tresa Endo A. Ernestina Penna, MD;  Location: [facility];  Service: Gynecology;  Laterality: Bilateral;   TENDON TRANSFER Right 07/29/2021   Procedure: TENDON TRANSFER;  Surgeon: Betha Loa, MD;  Location: [facility];  Service: Orthopedics;  Laterality: Right;   WISDOM TOOTH EXTRACTION      Family Psychiatric History: See below  Family History:  Family History  Problem Relation Age of Onset   Alcohol abuse Brother    Drug abuse Brother    Depression Mother    Anxiety disorder Mother    Anxiety disorder Brother    Depression Brother    Bipolar disorder Maternal Uncle    Anxiety disorder Paternal Grandfather    Depression Paternal Grandfather    Bipolar disorder Cousin    Breast cancer Maternal Aunt     Social History:  Social History   Socioeconomic History   Marital status: Married    Spouse name: Not on file   Number of children: Not on file   Years of education: Not on file   Highest education level: Not on file  Occupational History   Not on file  Tobacco Use   Smoking status: Former    Current packs/day: 0.00    Average packs/day: 0.8 packs/day for 10.0 years (7.5 ttl pk-yrs)    Types: Cigarettes    Start date: 02/07/1978    Quit date: 02/08/1988    Years since quitting: 35.1   Smokeless tobacco: Never  Vaping Use   Vaping status: Former  Substance and Sexual Activity   Alcohol use: No   Drug use: No   Sexual activity: Yes    Birth control/protection: None  Other Topics Concern   Not on file  Social History Narrative   Not on file   Social Drivers of Health   Financial Resource Strain: Not on file  Food Insecurity: Not on file  Transportation Needs: Not on file  Physical Activity: Not on file  Stress: Not on file  Social Connections: Not on file    Allergies:  Allergies  Allergen Reactions   Ciprofloxacin Other (See Comments)    Severe muscle and joint pain   Cefzil [Cefprozil]     possible hives   Augmentin [Amoxicillin-Pot Clavulanate] Nausea  And Vomiting    Throat felt funny and gave pt anxiety   Bee Venom Swelling    Metabolic Disorder Labs: No results found for: "HGBA1C", "MPG" No results found for: "PROLACTIN" Lab Results  Component Value Date   CHOL 192 04/02/2015   TRIG 69 04/02/2015   HDL 60 04/02/2015   CHOLHDL 3.2 04/02/2015   VLDL 14 04/02/2015   LDLCALC 118 (H) 04/02/2015   Lab Results  Component Value Date   TSH 0.922 01/14/2020   TSH 1.940 03/23/2015    Therapeutic Level Labs: No results found for: "LITHIUM" No results found for: "VALPROATE" No results found  for: "CBMZ"  Current Medications: Current Outpatient Medications  Medication Sig Dispense Refill   FLUoxetine (PROZAC) 20 MG capsule Take 1 capsule (20 mg total) by mouth daily. 30 capsule 2   azithromycin (ZITHROMAX) 250 MG tablet Take 1 tablet (250 mg total) by mouth daily. Take first 2 tablets together, then 1 every day until finished. 6 tablet 0   buPROPion (WELLBUTRIN SR) 150 MG 12 hr tablet Take 1 tablet (150 mg total) by mouth 2 (two) times daily. 60 tablet 5   diazepam (VALIUM) 5 MG tablet Take 1 tablet (5 mg total) by mouth 2 (two) times daily as needed. 60 tablet 2   DULoxetine (CYMBALTA) 30 MG capsule Take one daily for 1 week, then one every other day until gone 14 capsule 0   eletriptan (RELPAX) 40 MG tablet Take 1 tablet (40 mg total) by mouth every 2 (two) hours as needed for migraine (May repeat in 2 hours if headache persists or recurs.). 10 tablet 0   HYDROcodone-acetaminophen (NORCO) 5-325 MG tablet Take 1-2 tablets by mouth every 6 (six) hours as needed. 6 tablet 0   HYDROcodone-acetaminophen (NORCO) 5-325 MG tablet Take 1-2 tablets by mouth every 6 (six) hours as needed. 10 tablet 0   ketoconazole (NIZORAL) 2 % cream Apply 1 Application topically daily. 15 g 0   lisdexamfetamine (VYVANSE) 70 MG capsule Take 1 capsule (70 mg total) by mouth daily. 30 capsule 0   lisdexamfetamine (VYVANSE) 70 MG capsule Take 1 capsule (70 mg  total) by mouth daily. 30 capsule 0   lisdexamfetamine (VYVANSE) 70 MG capsule Take 1 capsule (70 mg total) by mouth daily. 30 capsule 0   nystatin (MYCOSTATIN/NYSTOP) powder Apply 1 Application topically 3 (three) times daily. 15 g 0   nystatin cream (MYCOSTATIN) Apply 1 Application topically 2 (two) times daily. 30 g 0   pramipexole (MIRAPEX) 1.5 MG tablet Take 1 tablet (1.5 mg total) by mouth at bedtime. 30 tablet 5   predniSONE (DELTASONE) 20 MG tablet Take 2 tablets (40 mg total) by mouth daily with breakfast. 10 tablet 0   No current facility-administered medications for this visit.     Musculoskeletal: Strength & Muscle Tone: within normal limits Gait & Station: normal Patient leans: N/A  Psychiatric Specialty Exam: Review of Systems  Constitutional:  Positive for appetite change and fatigue.  Musculoskeletal:  Positive for arthralgias and myalgias.  All other systems reviewed and are negative.   There were no vitals taken for this visit.There is no height or weight on file to calculate BMI.  General Appearance: Casual and Fairly Groomed  Eye Contact:  Good  Speech:  Clear and Coherent  Volume:  Normal  Mood:  Euthymic  Affect:  Congruent  Thought Process:  Goal Directed  Orientation:  Full (Time, Place, and Person)  Thought Content: Rumination   Suicidal Thoughts:  No  Homicidal Thoughts:  No  Memory:  Immediate;   Good Recent;   Good Remote;   Good  Judgement:  Good  Insight:  Good  Psychomotor Activity:  Normal  Concentration:  Concentration: Good and Attention Span: Good  Recall:  Good  Fund of Knowledge: Good  Language: Good  Akathisia:  No  Handed:  Right  AIMS (if indicated): not done  Assets:  Communication Skills Desire for Improvement Resilience Social Support Talents/Skills  ADL's:  Intact  Cognition: WNL  Sleep:  Good   Screenings: GAD-7    Flowsheet Row Telemedicine from 06/07/2019 in [facility]  Visit from 12/28/2017  in [facility]  Total GAD-7 Score 14 12      PHQ2-9    Flowsheet Row Video Visit from 03/15/2022 in [facility] [place] Video Visit from 01/11/2022 in [facility] [place] Video Visit from 06/29/2021 in [facility] [place] Video Visit from 04/29/2021 in [facility] [place] Video Visit from 03/18/2021 in [facility] Total Score 2 4 2 1 1   PHQ-9 Total Score 8 9 10  -- --      Flowsheet Row ED from 02/24/2023 in [facility] Department at [facility] Video Visit from 01/11/2022 in [facility] [place] Admission (Discharged) from 07/29/2021 in [facility]  C-SSRS RISK CATEGORY No Risk No Risk No Risk        Assessment and Plan: This patient is a 53 year old female with a history of fibromyalgia joint pain depression anxiety binge eating disorder.  She states that her depression worsened over the holidays and she is restarted Prozac 20 mg daily along with the Wellbutrin SR 150 mg twice daily for depression.  She has had some relief in symptoms so these will be continued.  She will also continue Vyvanse 70 mg each morning for ADD and binge eating disorder.  She will return to see me in 2 months  Collaboration of Care: Collaboration of Care: Primary Care Provider AEB notes will be shared with PCP  Patient/Guardian was advised Release of Information must be obtained prior to any record release in order to collaborate their care with an outside provider. Patient/Guardian was advised if they have not already done so to contact the registration department to sign all necessary forms in order for [place] to release information regarding their care.   Consent: Patient/Guardian gives verbal consent for treatment and assignment of benefits for services provided during  this visit. Patient/Guardian expressed understanding and agreed to proceed.    Diannia Ruder, MD 03/10/2023, 11:34 AM

## 2023-03-24 ENCOUNTER — Telehealth: Payer: 59 | Admitting: Physician Assistant

## 2023-03-24 DIAGNOSIS — L03211 Cellulitis of face: Secondary | ICD-10-CM | POA: Diagnosis not present

## 2023-03-24 MED ORDER — DOXYCYCLINE HYCLATE 100 MG PO TABS
100.0000 mg | ORAL_TABLET | Freq: Two times a day (BID) | ORAL | 0 refills | Status: DC
Start: 2023-03-24 — End: 2023-03-31

## 2023-03-24 NOTE — Progress Notes (Signed)
E-Visit for Cellulitis  We are sorry that you are not feeling well. Here is how we plan to help!  Based on what you shared with me it looks like you have cellulitis.  Cellulitis looks like areas of skin redness, swelling, and warmth; it develops as a result of bacteria entering under the skin. Little red spots and/or bleeding can be seen in skin, and tiny surface sacs containing fluid can occur. Fever can be present. Cellulitis is almost always on one side of a body, and the lower limbs are the most common site of involvement.   I have prescribed:  Doxycycline 100mg  Take 1 tablet twice daily fro 7 days  HOME CARE:  Take your medications as ordered and take all of them, even if the skin irritation appears to be healing.   GET HELP RIGHT AWAY IF:  Symptoms that don't begin to go away within 48 hours. Severe redness persists or worsens If the area turns color, spreads or swells. If it blisters and opens, develops yellow-brown crust or bleeds. You develop a fever or chills. If the pain increases or becomes unbearable.  Are unable to keep fluids and food down.  MAKE SURE YOU   Understand these instructions. Will watch your condition. Will get help right away if you are not doing well or get worse.  Thank you for choosing an e-visit.  Your e-visit answers were reviewed by a board certified advanced clinical practitioner to complete your personal care plan. Depending upon the condition, your plan could have included both over the counter or prescription medications.  Please review your pharmacy choice. Make sure the pharmacy is open so you can pick up prescription now. If there is a problem, you may contact your provider through [employer] and have the prescription routed to another pharmacy.  Your safety is important to [place]. If you have drug allergies check your prescription carefully.   For the next 24 hours you can use MyChart to ask questions about today's visit, request a  non-urgent call back, or ask for a work or school excuse. You will get an email in the next two days asking about your experience. I hope that your e-visit has been valuable and will speed your recovery.   I have spent 5 minutes in review of e-visit questionnaire, review and updating patient chart, medical decision making and response to patient.   Margaretann Loveless, PA-C

## 2023-03-28 ENCOUNTER — Encounter (HOSPITAL_COMMUNITY): Payer: 59

## 2023-03-31 ENCOUNTER — Telehealth: Payer: 59 | Admitting: Physician Assistant

## 2023-03-31 DIAGNOSIS — L03211 Cellulitis of face: Secondary | ICD-10-CM

## 2023-03-31 MED ORDER — DOXYCYCLINE HYCLATE 100 MG PO TABS: 100.0000 mg | ORAL_TABLET | Freq: Two times a day (BID) | ORAL | 0 refills | Status: AC

## 2023-03-31 NOTE — Progress Notes (Signed)
 E-Visit for Cellulitis  We are sorry that you are not feeling well. Here is how we plan to help!  Based on what you shared with me it looks like you have cellulitis.  Cellulitis looks like areas of skin redness, swelling, and warmth; it develops as a result of bacteria entering under the skin. Little red spots and/or bleeding can be seen in skin, and tiny surface sacs containing fluid can occur. Fever can be present. Cellulitis is almost always on one side of a body, and the lower limbs are the most common site of involvement.   We can continue doxycycline for another week, if no improvement please follow up your primary care physician for in person visit.   HOME CARE:  Take your medications as ordered and take all of them, even if the skin irritation appears to be healing.   GET HELP RIGHT AWAY IF:  Symptoms that don't begin to go away within 48 hours. Severe redness persists or worsens If the area turns color, spreads or swells. If it blisters and opens, develops yellow-brown crust or bleeds. You develop a fever or chills. If the pain increases or becomes unbearable.  Are unable to keep fluids and food down.  MAKE SURE YOU   Understand these instructions. Will watch your condition. Will get help right away if you are not doing well or get worse.  Thank you for choosing an e-visit.  Your e-visit answers were reviewed by a board certified advanced clinical practitioner to complete your personal care plan. Depending upon the condition, your plan could have included both over the counter or prescription medications.  Please review your pharmacy choice. Make sure the pharmacy is open so you can pick up prescription now. If there is a problem, you may contact your provider through [employer] and have the prescription routed to another pharmacy.  Your safety is important to [place]. If you have drug allergies check your prescription carefully.   For the next 24 hours you can use  MyChart to ask questions about today's visit, request a non-urgent call back, or ask for a work or school excuse. You will get an email in the next two days asking about your experience. I hope that your e-visit has been valuable and will speed your recovery.   I have spent 5 minutes in review of e-visit questionnaire, review and updating patient chart, medical decision making and response to patient.   Tylene Fantasia Ward, PA-C

## 2023-05-02 ENCOUNTER — Ambulatory Visit (HOSPITAL_COMMUNITY)
Admission: RE | Admit: 2023-05-02 | Discharge: 2023-05-02 | Disposition: A | Discharge status: Home / Self Care | Source: Ambulatory Visit | Attending: Internal Medicine | Admitting: Internal Medicine

## 2023-05-02 ENCOUNTER — Encounter (HOSPITAL_COMMUNITY): Payer: 59

## 2023-05-02 DIAGNOSIS — R0600 Dyspnea, unspecified: Secondary | ICD-10-CM | POA: Insufficient documentation

## 2023-05-02 LAB — PULMONARY FUNCTION TEST
DL/VA % pred: 120 %
DL/VA: 5.03 ml/min/mmHg/L
DLCO unc % pred: 110 %
DLCO unc: 25.06 ml/min/mmHg
FEF 25-75 Post: 2.88 L/s
FEF 25-75 Pre: 1.68 L/s
FEF2575-%Change-Post: 71 %
FEF2575-%Pred-Post: 103 %
FEF2575-%Pred-Pre: 60 %
FEV1-%Change-Post: 16 %
FEV1-%Pred-Post: 96 %
FEV1-%Pred-Pre: 82 %
FEV1-Post: 2.89 L
FEV1-Pre: 2.48 L
FEV1FVC-%Change-Post: 7 %
FEV1FVC-%Pred-Pre: 88 %
FEV6-%Change-Post: 7 %
FEV6-%Pred-Post: 101 %
FEV6-%Pred-Pre: 94 %
FEV6-Post: 3.78 L
FEV6-Pre: 3.5 L
FEV6FVC-%Change-Post: 0 %
FEV6FVC-%Pred-Post: 102 %
FEV6FVC-%Pred-Pre: 102 %
FVC-%Change-Post: 8 %
FVC-%Pred-Post: 99 %
FVC-%Pred-Pre: 91 %
FVC-Post: 3.81 L
FVC-Pre: 3.52 L
Post FEV1/FVC ratio: 76 %
Post FEV6/FVC ratio: 99 %
Pre FEV1/FVC ratio: 70 %
Pre FEV6/FVC Ratio: 99 %
RV % pred: 143 %
RV: 2.9 L
TLC % pred: 118 %
TLC: 6.54 L

## 2023-05-02 MED ORDER — ALBUTEROL SULFATE (2.5 MG/3ML) 0.083% IN NEBU
2.5000 mg | INHALATION_SOLUTION | Freq: Once | RESPIRATORY_TRACT | Status: AC
  Administered 2023-05-02: 2.5 mg via RESPIRATORY_TRACT

## 2023-05-08 ENCOUNTER — Encounter (HOSPITAL_COMMUNITY): Payer: Self-pay | Admitting: Psychiatry

## 2023-05-08 ENCOUNTER — Telehealth (INDEPENDENT_AMBULATORY_CARE_PROVIDER_SITE_OTHER): Payer: 59 | Admitting: Psychiatry

## 2023-05-08 DIAGNOSIS — F900 Attention-deficit hyperactivity disorder, predominantly inattentive type: Secondary | ICD-10-CM

## 2023-05-08 DIAGNOSIS — F5081 Binge eating disorder, mild: Secondary | ICD-10-CM | POA: Diagnosis not present

## 2023-05-08 DIAGNOSIS — F418 Other specified anxiety disorders: Secondary | ICD-10-CM

## 2023-05-08 MED ORDER — LISDEXAMFETAMINE DIMESYLATE 70 MG PO CAPS: 70.0000 mg | ORAL_CAPSULE | Freq: Every day | ORAL | 0 refills | Status: DC

## 2023-05-08 MED ORDER — DIAZEPAM 5 MG PO TABS: 5.0000 mg | ORAL_TABLET | Freq: Two times a day (BID) | ORAL | 2 refills | Status: DC | PRN

## 2023-05-08 MED ORDER — BUPROPION HCL ER (SR) 150 MG PO TB12: 150.0000 mg | ORAL_TABLET | Freq: Two times a day (BID) | ORAL | 5 refills | Status: DC

## 2023-05-08 MED ORDER — FLUOXETINE HCL 20 MG PO CAPS: 20.0000 mg | ORAL_CAPSULE | Freq: Every day | ORAL | 2 refills | Status: DC

## 2023-05-08 NOTE — Progress Notes (Signed)
 Virtual Visit via Video Note  I connected with Sheri Wood on 05/08/23 at  9:40 AM EDT by a video enabled telemedicine application and verified that I am speaking with the correct person using two identifiers.  Location: Patient: home Provider: office   I discussed the limitations of evaluation and management by telemedicine and the availability of in person appointments. The patient expressed understanding and agreed to proceed.      I discussed the assessment and treatment plan with the patient. The patient was provided an opportunity to ask questions and all were answered. The patient agreed with the plan and demonstrated an understanding of the instructions.   The patient was advised to call back or seek an in-person evaluation if the symptoms worsen or if the condition fails to improve as anticipated.  I provided 20 minutes of non-face-to-face time during this encounter.   Diannia Ruder, MD  [facility] MD/PA/NP OP Progress Note  05/08/2023 9:52 AM Sheri Wood  MRN:  [id number]  Chief Complaint:  Chief Complaint  Patient presents with   Depression   Anxiety   ADD   Follow-up   HPI: This patient is a 53 year old married white female who lives with her husband in [place].  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  The patient returns for follow-up after 3 months regarding her depression anxiety ADD and binge eating disorder.  She also has chronic pain fibromyalgia and chronic fatigue.  The patient states that she recently saw a rheumatologist to finally diagnosed her with known serologic rheumatoid arthritis as well as Sjogren's syndrome.  She is now on a new medication-Arava.  She has not been on it long enough to know if it is going to help.  She states for now her fatigue seems worse.  The Vyvanse does help with this definitely she can get through her workday.  She states that adding the Prozac to the bupropion has helped her mood and she is not  significantly depressed or anxious.  She is sleeping well.  She rarely has to use the Valium.  She denies any thoughts of suicide or self-harm. Visit Diagnosis:    ICD-10-CM   1. Depression with anxiety  F41.8     2. Attention deficit hyperactivity disorder (ADHD), predominantly inattentive type  F90.0     3. 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       Past Psychiatric History: Long-term outpatient treatment  Past Medical History:  Past Medical History:  Diagnosis Date   Allergy    seasonal   Anxiety    Depression    Fibromyalgia    Gastritis    history   GERD (gastroesophageal reflux disease)    otc med prn   IBS (irritable bowel syndrome)    Migraine    SVD (spontaneous vaginal delivery)    x 2   Urticaria, chronic    Vertigo     Past Surgical History:  Procedure Laterality Date   CARPOMETACARPEL SUSPENSION PLASTY Right 07/29/2021   Procedure: RIGHT THUMB TRAPEZIECTOMY AND SUSPENSIONPLASTY;  Surgeon: Betha Loa, MD;  Location: [facility];  Service: Orthopedics;  Laterality: Right;  75 MIN   CHOLECYSTECTOMY N/A 04/08/2020   Procedure: LAPAROSCOPIC CHOLECYSTECTOMY;  Surgeon: [name], MD;  Location: [facility];  Service: General;  Laterality: N/A;   COLONOSCOPY  2008   negative   DIAGNOSTIC LAPAROSCOPY     fibroids   DILATION AND CURETTAGE OF UTERUS  DILITATION & CURRETTAGE/HYSTROSCOPY WITH NOVASURE ABLATION N/A 05/06/2013   Procedure: DILATATION & CURETTAGE/HYSTEROSCOPY WITH NOVASURE ABLATION;  Surgeon: Alphonsus Sias. Ernestina Penna, MD;  Location: [facility];  Service: Gynecology;  Laterality: N/A;   EYE SURGERY     bilateral lasik   FOOT SURGERY  2017   LAPAROSCOPIC BILATERAL SALPINGECTOMY Bilateral 05/06/2013   Procedure: LAPAROSCOPIC BILATERAL SALPINGECTOMY;  Surgeon: Tresa Endo A. Ernestina Penna, MD;  Location: [facility];  Service: Gynecology;  Laterality: Bilateral;   TENDON TRANSFER Right 07/29/2021   Procedure: TENDON TRANSFER;  Surgeon: Betha Loa, MD;  Location:  [facility];  Service: Orthopedics;  Laterality: Right;   WISDOM TOOTH EXTRACTION      Family Psychiatric History: See below  Family History:  Family History  Problem Relation Age of Onset   Alcohol abuse Brother    Drug abuse Brother    Depression Mother    Anxiety disorder Mother    Anxiety disorder Brother    Depression Brother    Bipolar disorder Maternal Uncle    Anxiety disorder Paternal Grandfather    Depression Paternal Grandfather    Bipolar disorder Cousin    Breast cancer Maternal Aunt     Social History:  Social History   Socioeconomic History   Marital status: Married    Spouse name: Not on file   Number of children: Not on file   Years of education: Not on file   Highest education level: Not on file  Occupational History   Not on file  Tobacco Use   Smoking status: Former    Current packs/day: 0.00    Average packs/day: 0.8 packs/day for 10.0 years (7.5 ttl pk-yrs)    Types: Cigarettes    Start date: 02/07/1978    Quit date: 02/08/1988    Years since quitting: 35.2   Smokeless tobacco: Never  Vaping Use   Vaping status: Former  Substance and Sexual Activity   Alcohol use: No   Drug use: No   Sexual activity: Yes    Birth control/protection: None  Other Topics Concern   Not on file  Social History Narrative   Not on file   Social Drivers of Health   Financial Resource Strain: Not on file  Food Insecurity: Not on file  Transportation Needs: Not on file  Physical Activity: Not on file  Stress: Not on file  Social Connections: Not on file    Allergies:  Allergies  Allergen Reactions   Ciprofloxacin Other (See Comments)    Severe muscle and joint pain   Cefzil [Cefprozil]     possible hives   Augmentin [Amoxicillin-Pot Clavulanate] Nausea And Vomiting    Throat felt funny and gave pt anxiety   Bee Venom Swelling    Metabolic Disorder Labs: No results found for: "HGBA1C", "MPG" No results found for: "PROLACTIN" Lab  Results  Component Value Date   CHOL 192 04/02/2015   TRIG 69 04/02/2015   HDL 60 04/02/2015   CHOLHDL 3.2 04/02/2015   VLDL 14 04/02/2015   LDLCALC 118 (H) 04/02/2015   Lab Results  Component Value Date   TSH 0.922 01/14/2020   TSH 1.940 03/23/2015    Therapeutic Level Labs: No results found for: "LITHIUM" No results found for: "VALPROATE" No results found for: "CBMZ"  Current Medications: Current Outpatient Medications  Medication Sig Dispense Refill   lisdexamfetamine (VYVANSE) 70 MG capsule Take 1 capsule (70 mg total) by mouth daily. 30 capsule 0   lisdexamfetamine (VYVANSE) 70 MG capsule Take 1 capsule (70  mg total) by mouth daily. 30 capsule 0   buPROPion (WELLBUTRIN SR) 150 MG 12 hr tablet Take 1 tablet (150 mg total) by mouth 2 (two) times daily. 60 tablet 5   diazepam (VALIUM) 5 MG tablet Take 1 tablet (5 mg total) by mouth 2 (two) times daily as needed. 60 tablet 2   eletriptan (RELPAX) 40 MG tablet Take 1 tablet (40 mg total) by mouth every 2 (two) hours as needed for migraine (May repeat in 2 hours if headache persists or recurs.). 10 tablet 0   FLUoxetine (PROZAC) 20 MG capsule Take 1 capsule (20 mg total) by mouth daily. 30 capsule 2   leflunomide (ARAVA) 20 MG tablet Take 1 tablet by mouth daily.     lisdexamfetamine (VYVANSE) 70 MG capsule Take 1 capsule (70 mg total) by mouth daily. 30 capsule 0   lisdexamfetamine (VYVANSE) 70 MG capsule Take 1 capsule (70 mg total) by mouth daily. 30 capsule 0   No current facility-administered medications for this visit.     Musculoskeletal: Strength & Muscle Tone: within normal limits Gait & Station: normal Patient leans: N/A  Psychiatric Specialty Exam: Review of Systems  Constitutional:  Positive for fatigue.  Musculoskeletal:  Positive for arthralgias.  All other systems reviewed and are negative.   There were no vitals taken for this visit.There is no height or weight on file to calculate BMI.  General  Appearance: Casual, Neat, and Well Groomed  Eye Contact:  Good  Speech:  Clear and Coherent  Volume:  Normal  Mood:  Euthymic  Affect:  Congruent  Thought Process:  Goal Directed  Orientation:  Full (Time, Place, and Person)  Thought Content: WDL   Suicidal Thoughts:  No  Homicidal Thoughts:  No  Memory:  Immediate;   Good Recent;   Good Remote;   Good  Judgement:  Good  Insight:  Fair  Psychomotor Activity:  Decreased  Concentration:  Concentration: Good and Attention Span: Good  Recall:  Good  Fund of Knowledge: Good  Language: Good  Akathisia:  No  Handed:  Right  AIMS (if indicated): not done  Assets:  Communication Skills Desire for Improvement Resilience Social Support Talents/Skills  ADL's:  Intact  Cognition: WNL  Sleep:  Good   Screenings: GAD-7    Flowsheet Row Telemedicine from 06/07/2019 in [facility] Office Visit from 12/28/2017 in [facility]  Total GAD-7 Score 14 12      PHQ2-9    Flowsheet Row Video Visit from 03/15/2022 in [facility] [place] Video Visit from 01/11/2022 in [facility] [place] Video Visit from 06/29/2021 in [facility] [place] Video Visit from 04/29/2021 in [facility] at [place] Video Visit from 03/18/2021 in [facility] Total Score 2 4 2 1 1   PHQ-9 Total Score 8 9 10  -- --      Flowsheet Row ED from 02/24/2023 in [facility] Video Visit from 01/11/2022 in [facility] [place] Admission (Discharged) from 07/29/2021 in [facility]  C-SSRS RISK CATEGORY No Risk No Risk No Risk        Assessment and Plan: This patient is a 53 year old female with a history of fibromyalgia joint pain depression anxiety binge eating disorder.  She has not been formally  diagnosed with not serologic rheumatoid arthritis and Sjogren syndrome.  She does  get some relief from fatigue with the Vyvanse 70 mg every morning for ADD and binge eating so this will be continued.  She will continue Prozac 20 mg daily along with Wellbutrin SR 150 mg twice daily for depression.  She will continue Valium 5 mg up to twice daily only as needed for anxiety.  She will return to see me in 3 months  Collaboration of Care: Collaboration of Care: Primary Care Provider AEB notes will be shared with PCP at patient's request  Patient/Guardian was advised Release of Information must be obtained prior to any record release in order to collaborate their care with an outside provider. Patient/Guardian was advised if they have not already done so to contact the registration department to sign all necessary forms in order for [place] to release information regarding their care.   Consent: Patient/Guardian gives verbal consent for treatment and assignment of benefits for services provided during this visit. Patient/Guardian expressed understanding and agreed to proceed.    Diannia Ruder, MD 05/08/2023, 9:52 AM

## 2023-06-12 NOTE — Progress Notes (Deleted)
 Sheri Wood, female    DOB: 1970-04-18    MRN: [id number]   Brief patient profile:  ***  yo*** *** referred to pulmonary clinic in [place]  06/14/2023 by *** for ***   Pt not previously seen by PCCM service.     History of Present Illness  06/14/2023  Pulmonary/ 1st office eval/ Darik Massing / [place] [facility]  No chief complaint on file.    Dyspnea:  *** Cough: *** Sleep: *** SABA use: *** 02: *** LDSCT:***  No obvious day to day or daytime pattern/variability or assoc excess/ purulent sputum or mucus plugs or hemoptysis or cp or chest tightness, subjective wheeze or overt sinus or hb symptoms.    Also denies any obvious fluctuation of symptoms with weather or environmental changes or other aggravating or alleviating factors except as outlined above   No unusual exposure hx or h/o childhood pna/ asthma or knowledge of premature birth.  Current Allergies, Complete Past Medical History, Past Surgical History, Family History, and Social History were reviewed in [employer] record.  ROS  The following are not active complaints unless bolded Hoarseness, sore throat, dysphagia, dental problems, itching, sneezing,  nasal congestion or discharge of excess mucus or purulent secretions, ear ache,   fever, chills, sweats, unintended wt loss or wt gain, classically pleuritic or exertional cp,  orthopnea pnd or arm/hand swelling  or leg swelling, presyncope, palpitations, abdominal pain, anorexia, nausea, vomiting, diarrhea  or change in bowel habits or change in bladder habits, change in stools or change in urine, dysuria, hematuria,  rash, arthralgias, visual complaints, headache, numbness, weakness or ataxia or problems with walking or coordination,  change in mood or  memory.            Outpatient Medications Prior to Visit  Medication Sig Dispense Refill   buPROPion  (WELLBUTRIN  SR) 150 MG 12 hr tablet Take 1 tablet (150 mg total) by mouth 2 (two) times daily. 60  tablet 5   diazepam  (VALIUM ) 5 MG tablet Take 1 tablet (5 mg total) by mouth 2 (two) times daily as needed. 60 tablet 2   eletriptan  (RELPAX ) 40 MG tablet Take 1 tablet (40 mg total) by mouth every 2 (two) hours as needed for migraine (May repeat in 2 hours if headache persists or recurs.). 10 tablet 0   FLUoxetine  (PROZAC ) 20 MG capsule Take 1 capsule (20 mg total) by mouth daily. 30 capsule 2   leflunomide (ARAVA) 20 MG tablet Take 1 tablet by mouth daily.     lisdexamfetamine (VYVANSE ) 70 MG capsule Take 1 capsule (70 mg total) by mouth daily. 30 capsule 0   lisdexamfetamine (VYVANSE ) 70 MG capsule Take 1 capsule (70 mg total) by mouth daily. 30 capsule 0   lisdexamfetamine (VYVANSE ) 70 MG capsule Take 1 capsule (70 mg total) by mouth daily. 30 capsule 0   lisdexamfetamine (VYVANSE ) 70 MG capsule Take 1 capsule (70 mg total) by mouth daily. 30 capsule 0   No facility-administered medications prior to visit.    Past Medical History:  Diagnosis Date   Allergy    seasonal   Anxiety    Depression    Fibromyalgia    Gastritis    history   GERD (gastroesophageal reflux disease)    otc med prn   IBS (irritable bowel syndrome)    Migraine    SVD (spontaneous vaginal delivery)    x 2   Urticaria, chronic    Vertigo  Objective:     There were no vitals taken for this visit.         Assessment   No problem-specific Assessment & Plan notes found for this encounter.     Vernestine Gondola, MD 06/12/2023

## 2023-06-13 ENCOUNTER — Other Ambulatory Visit: Payer: Self-pay | Admitting: Obstetrics and Gynecology

## 2023-06-13 DIAGNOSIS — N6310 Unspecified lump in the right breast, unspecified quadrant: Secondary | ICD-10-CM

## 2023-06-14 ENCOUNTER — Ambulatory Visit: Admitting: Internal Medicine

## 2023-06-19 ENCOUNTER — Ambulatory Visit
Admission: RE | Admit: 2023-06-19 | Discharge: 2023-06-19 | Disposition: A | Discharge status: Home / Self Care | Source: Ambulatory Visit | Attending: Obstetrics and Gynecology

## 2023-06-19 DIAGNOSIS — N6310 Unspecified lump in the right breast, unspecified quadrant: Secondary | ICD-10-CM

## 2023-07-24 ENCOUNTER — Telehealth: Admitting: Physician Assistant

## 2023-07-24 DIAGNOSIS — J4540 Moderate persistent asthma, uncomplicated: Secondary | ICD-10-CM | POA: Diagnosis not present

## 2023-07-24 MED ORDER — ALBUTEROL SULFATE HFA 108 (90 BASE) MCG/ACT IN AERS: 2.0000 | INHALATION_SPRAY | Freq: Four times a day (QID) | RESPIRATORY_TRACT | 0 refills | Status: AC | PRN

## 2023-07-24 MED ORDER — AZITHROMYCIN 250 MG PO TABS
ORAL_TABLET | ORAL | 0 refills | Status: AC
Start: 2023-07-24 — End: 2023-07-29

## 2023-07-24 MED ORDER — PREDNISONE 20 MG PO TABS
20.0000 mg | ORAL_TABLET | Freq: Two times a day (BID) | ORAL | 0 refills | Status: AC
Start: 2023-07-24 — End: 2023-07-29

## 2023-07-24 NOTE — Progress Notes (Signed)
 E-Visit for Cough   We are sorry that you are not feeling well.  Here is how we plan to help!  Based on your presentation I believe you most likely have A cough due to bacteria.  When patients have a fever and a productive cough with a change in color or increased sputum production, we are concerned about bacterial bronchitis.  If left untreated it can progress to pneumonia.  If your symptoms do not improve with your treatment plan it is important that you contact your provider.   I have prescribed Azithromyin 250 mg: two tablets now and then one tablet daily for 4 additonal days    I also sent prednisone .   From your responses in the eVisit questionnaire you describe inflammation in the upper respiratory tract which is causing a significant cough.  This is commonly called Bronchitis and has four common causes:   Allergies Viral Infections Acid Reflux Bacterial Infection Allergies, viruses and acid reflux are treated by controlling symptoms or eliminating the cause. An example might be a cough caused by taking certain blood pressure medications. You stop the cough by changing the medication. Another example might be a cough caused by acid reflux. Controlling the reflux helps control the cough.  USE OF BRONCHODILATOR (RESCUE) INHALERS: There is a risk from using your bronchodilator too frequently.  The risk is that over-reliance on a medication which only relaxes the muscles surrounding the breathing tubes can reduce the effectiveness of medications prescribed to reduce swelling and congestion of the tubes themselves.  Although you feel brief relief from the bronchodilator inhaler, your asthma may actually be worsening with the tubes becoming more swollen and filled with mucus.  This can delay other crucial treatments, such as oral steroid medications. If you need to use a bronchodilator inhaler daily, several times per day, you should discuss this with your provider.  There are probably better  treatments that could be used to keep your asthma under control.     HOME CARE Only take medications as instructed by your medical team. Complete the entire course of an antibiotic. Drink plenty of fluids and get plenty of rest. Avoid close contacts especially the very young and the elderly Cover your mouth if you cough or cough into your sleeve. Always remember to wash your hands A steam or ultrasonic humidifier can help congestion.   GET HELP RIGHT AWAY IF: You develop worsening fever. You become short of breath You cough up blood. Your symptoms persist after you have completed your treatment plan MAKE SURE YOU  Understand these instructions. Will watch your condition. Will get help right away if you are not doing well or get worse.    Thank you for choosing an e-visit.  Your e-visit answers were reviewed by a board certified advanced clinical practitioner to complete your personal care plan. Depending upon the condition, your plan could have included both over the counter or prescription medications.  Please review your pharmacy choice. Make sure the pharmacy is open so you can pick up prescription now. If there is a problem, you may contact your provider through [employer] and have the prescription routed to another pharmacy.  Your safety is important to us . If you have drug allergies check your prescription carefully.   For the next 24 hours you can use MyChart to ask questions about today's visit, request a non-urgent call back, or ask for a work or school excuse. You will get an email in the next two days asking  about your experience. I hope that your e-visit has been valuable and will speed your recovery.  have provided 5 minutes of non face to face time during this encounter for chart review and documentation.

## 2023-07-31 ENCOUNTER — Telehealth (HOSPITAL_COMMUNITY): Payer: Self-pay

## 2023-07-31 NOTE — Telephone Encounter (Signed)
 Called pharmacy they did have one even though last fill was to fill 07/07/23. Called pt no answer left vm

## 2023-07-31 NOTE — Telephone Encounter (Addendum)
 Pt called in requesting a refill on her lisdexamfetamine (VYVANSE ) 70 MG capsule sent to [facility]. Pt states bottle has 0 refills on it. Pt scheduled 08/14/23 with Dr Okey. Please advise.

## 2023-08-10 ENCOUNTER — Ambulatory Visit: Admitting: Internal Medicine

## 2023-08-14 ENCOUNTER — Encounter (HOSPITAL_COMMUNITY): Payer: Self-pay | Admitting: Psychiatry

## 2023-08-14 ENCOUNTER — Telehealth (INDEPENDENT_AMBULATORY_CARE_PROVIDER_SITE_OTHER): Admitting: Psychiatry

## 2023-08-14 DIAGNOSIS — F418 Other specified anxiety disorders: Secondary | ICD-10-CM

## 2023-08-14 DIAGNOSIS — F50811 Binge eating disorder, moderate: Secondary | ICD-10-CM

## 2023-08-14 DIAGNOSIS — F900 Attention-deficit hyperactivity disorder, predominantly inattentive type: Secondary | ICD-10-CM | POA: Diagnosis not present

## 2023-08-14 MED ORDER — DESVENLAFAXINE SUCCINATE ER 50 MG PO TB24
50.0000 mg | ORAL_TABLET | Freq: Every day | ORAL | 3 refills | Status: DC
Start: 2023-08-14 — End: 2023-09-06

## 2023-08-14 MED ORDER — BUPROPION HCL ER (SR) 150 MG PO TB12: 150.0000 mg | ORAL_TABLET | Freq: Two times a day (BID) | ORAL | 5 refills | Status: DC

## 2023-08-14 MED ORDER — DIAZEPAM 5 MG PO TABS: 5.0000 mg | ORAL_TABLET | Freq: Two times a day (BID) | ORAL | 2 refills | Status: AC | PRN

## 2023-08-14 MED ORDER — LISDEXAMFETAMINE DIMESYLATE 70 MG PO CAPS: 70.0000 mg | ORAL_CAPSULE | Freq: Every day | ORAL | 0 refills | Status: DC

## 2023-08-14 NOTE — Progress Notes (Signed)
 Virtual Visit via Telephone Note  I connected with Sheri Wood on 08/14/23 at 11:00 AM EDT by telephone and verified that I am speaking with the correct person using two identifiers.  Location: Patient: home Provider: office   I discussed the limitations, risks, security and privacy concerns of performing an evaluation and management service by telephone and the availability of in person appointments. I also discussed with the patient that there may be a patient responsible charge related to this service. The patient expressed understanding and agreed to proceed.       I discussed the assessment and treatment plan with the patient. The patient was provided an opportunity to ask questions and all were answered. The patient agreed with the plan and demonstrated an understanding of the instructions.   The patient was advised to call back or seek an in-person evaluation if the symptoms worsen or if the condition fails to improve as anticipated.  I provided 20 minutes of non-face-to-face time during this encounter.   Barnie Gull, MD  [facility] MD/PA/NP OP Progress Note  08/14/2023 11:15 AM Sheri Wood  MRN:  [id number]  Chief Complaint:  Chief Complaint  Patient presents with   Depression   Anxiety   ADHD   Follow-up   HPI: This patient is a 53 year old married white female who lives with her husband in [place].  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   The patient returns for follow-up after 3 months regarding her depression anxiety ADD and binge eating disorder.  She also has chronic pain fibromyalgia and chronic fatigue.   The patient  states that she is still somewhat depressed.  She is dealing with her elderly father as well as her husband's chronic health issues.  She is also working full-time.  She is also going through menopause with resultant hot flashes and insomnia.  She just last week got placed on hormone replacement therapy which she is  wary about because her aunt had breast cancer.  she is going to do genetic testing to look for the breast cancer gene.  She states she is more depressed but not suicidal.  She wonders if her medications are no longer working.  I suggested that we substitute Pristiq  for the Prozac  as it may help a little bit more with the hot flashes.  She is willing to try this.  She also is having a lot of trouble with her binge eating and the Vyvanse  is not working all that well.  I strongly suggested she speak to her PCP about a GLP-1 like Ozempic and she is going to do so. Visit Diagnosis:    ICD-10-CM   1. Depression with anxiety  F41.8     2. Attention deficit hyperactivity disorder (ADHD), predominantly inattentive type  F90.0     3. Moderate binge-eating disorder  F50.811       Past Psychiatric History: \Long term outpatient treatment  Past Medical History:  Past Medical History:  Diagnosis Date   Allergy    seasonal   Anxiety    Depression    Fibromyalgia    Gastritis    history   GERD (gastroesophageal reflux disease)    otc med prn   IBS (irritable bowel syndrome)    Migraine    SVD (spontaneous vaginal delivery)    x 2   Urticaria, chronic    Vertigo     Past Surgical History:  Procedure Laterality Date   CARPOMETACARPEL SUSPENSION PLASTY  Right 07/29/2021   Procedure: RIGHT THUMB TRAPEZIECTOMY AND SUSPENSIONPLASTY;  Surgeon: Murrell Drivers, MD;  Location: [facility];  Service: Orthopedics;  Laterality: Right;  75 MIN   CHOLECYSTECTOMY N/A 04/08/2020   Procedure: LAPAROSCOPIC CHOLECYSTECTOMY;  Surgeon: Kallie Manuelita BROCKS, MD;  Location: [facility];  Service: General;  Laterality: N/A;   COLONOSCOPY  2008   negative   DIAGNOSTIC LAPAROSCOPY     fibroids   DILATION AND CURETTAGE OF UTERUS     DILITATION & CURRETTAGE/HYSTROSCOPY WITH NOVASURE ABLATION N/A 05/06/2013   Procedure: DILATATION & CURETTAGE/HYSTEROSCOPY WITH NOVASURE ABLATION;  Surgeon: Burnard LABOR. Kandyce, MD;   Location: [facility];  Service: Gynecology;  Laterality: N/A;   EYE SURGERY     bilateral lasik   FOOT SURGERY  2017   LAPAROSCOPIC BILATERAL SALPINGECTOMY Bilateral 05/06/2013   Procedure: LAPAROSCOPIC BILATERAL SALPINGECTOMY;  Surgeon: Burnard A. Kandyce, MD;  Location: [facility];  Service: Gynecology;  Laterality: Bilateral;   TENDON TRANSFER Right 07/29/2021   Procedure: TENDON TRANSFER;  Surgeon: Murrell Drivers, MD;  Location: [facility];  Service: Orthopedics;  Laterality: Right;   WISDOM TOOTH EXTRACTION      Family Psychiatric History: See below  Family History:  Family History  Problem Relation Age of Onset   Alcohol abuse Brother    Drug abuse Brother    Depression Mother    Anxiety disorder Mother    Anxiety disorder Brother    Depression Brother    Bipolar disorder Maternal Uncle    Anxiety disorder Paternal Grandfather    Depression Paternal Grandfather    Bipolar disorder Cousin    Breast cancer Maternal Aunt     Social History:  Social History   Socioeconomic History   Marital status: Married    Spouse name: Not on file   Number of children: Not on file   Years of education: Not on file   Highest education level: Not on file  Occupational History   Not on file  Tobacco Use   Smoking status: Former    Current packs/day: 0.00    Average packs/day: 0.8 packs/day for 10.0 years (7.5 ttl pk-yrs)    Types: Cigarettes    Start date: 02/07/1978    Quit date: 02/08/1988    Years since quitting: 35.5   Smokeless tobacco: Never  Vaping Use   Vaping status: Former  Substance and Sexual Activity   Alcohol use: No   Drug use: No   Sexual activity: Yes    Birth control/protection: None  Other Topics Concern   Not on file  Social History Narrative   Not on file   Social Drivers of Health   Financial Resource Strain: Not on file  Food Insecurity: Not on file  Transportation Needs: Not on file  Physical Activity: Not on file  Stress: Not on file  Social  Connections: Not on file    Allergies:  Allergies  Allergen Reactions   Ciprofloxacin  Other (See Comments)    Severe muscle and joint pain   Cefzil [Cefprozil]     possible hives   Augmentin  [Amoxicillin -Pot Clavulanate] Nausea And Vomiting    Throat felt funny and gave pt anxiety   Bee Venom Swelling    Metabolic Disorder Labs: No results found for: HGBA1C, MPG No results found for: PROLACTIN Lab Results  Component Value Date   CHOL 192 04/02/2015   TRIG 69 04/02/2015   HDL 60 04/02/2015   CHOLHDL 3.2 04/02/2015   VLDL 14 04/02/2015  LDLCALC 118 (H) 04/02/2015   Lab Results  Component Value Date   TSH 0.922 01/14/2020   TSH 1.940 03/23/2015    Therapeutic Level Labs: No results found for: LITHIUM No results found for: VALPROATE No results found for: CBMZ  Current Medications: Current Outpatient Medications  Medication Sig Dispense Refill   desvenlafaxine  (PRISTIQ ) 50 MG 24 hr tablet Take 1 tablet (50 mg total) by mouth daily. 30 tablet 3   albuterol  (VENTOLIN  HFA) 108 (90 Base) MCG/ACT inhaler Inhale 2 puffs into the lungs every 6 (six) hours as needed for wheezing or shortness of breath. 8 g 0   buPROPion  (WELLBUTRIN  SR) 150 MG 12 hr tablet Take 1 tablet (150 mg total) by mouth 2 (two) times daily. 60 tablet 5   diazepam  (VALIUM ) 5 MG tablet Take 1 tablet (5 mg total) by mouth 2 (two) times daily as needed. 60 tablet 2   eletriptan  (RELPAX ) 40 MG tablet Take 1 tablet (40 mg total) by mouth every 2 (two) hours as needed for migraine (May repeat in 2 hours if headache persists or recurs.). 10 tablet 0   FLUoxetine  (PROZAC ) 20 MG capsule Take 1 capsule (20 mg total) by mouth daily. 30 capsule 2   lisdexamfetamine (VYVANSE ) 70 MG capsule Take 1 capsule (70 mg total) by mouth daily. 30 capsule 0   lisdexamfetamine (VYVANSE ) 70 MG capsule Take 1 capsule (70 mg total) by mouth daily. 30 capsule 0   lisdexamfetamine (VYVANSE ) 70 MG capsule Take 1 capsule (70 mg  total) by mouth daily. 30 capsule 0   lisdexamfetamine (VYVANSE ) 70 MG capsule Take 1 capsule (70 mg total) by mouth daily. 30 capsule 0   No current facility-administered medications for this visit.     Musculoskeletal: Strength & Muscle Tone: na Gait & Station: na Patient leans: N/A  Psychiatric Specialty Exam: Review of Systems  Constitutional:  Positive for appetite change and fatigue.  Endocrine: Positive for heat intolerance.  Genitourinary:  Positive for menstrual problem.  Musculoskeletal:  Positive for arthralgias and joint swelling.  Neurological:  Positive for headaches.  Psychiatric/Behavioral:  Positive for dysphoric mood and sleep disturbance.   All other systems reviewed and are negative.   There were no vitals taken for this visit.There is no height or weight on file to calculate BMI.  General Appearance: NA  Eye Contact:  NA  Speech:  Clear and Coherent  Volume:  Normal  Mood:  Dysphoric  Affect:  NA  Thought Process:  Goal Directed  Orientation:  Full (Time, Place, and Person)  Thought Content: WDL   Suicidal Thoughts:  No  Homicidal Thoughts:  No  Memory:  Immediate;   Good Recent;   Good Remote;   Good  Judgement:  Good  Insight:  Good  Psychomotor Activity:  Decreased  Concentration:  Concentration: Good and Attention Span: Good  Recall:  Good  Fund of Knowledge: Good  Language: Good  Akathisia:  No  Handed:  Right  AIMS (if indicated): not done  Assets:  Communication Skills Desire for Improvement Resilience Social Support Talents/Skills  ADL's:  Intact  Cognition: WNL  Sleep:  Poor   Screenings: GAD-7    Flowsheet Row Telemedicine from 06/07/2019 in [facility] Office Visit from 12/28/2017 in [facility]  Total GAD-7 Score 14 12   PHQ2-9    Flowsheet Row Video Visit from 03/15/2022 in [facility] at [place] Video Visit from 01/11/2022 in [facility] at  [place] Video Visit from 06/29/2021 in [facility] at [place] Video Visit from 04/29/2021 in [facility]patient [facility] at [place] Video Visit from 03/18/2021 in [facility] at [facility] Total Score 2 4 2 1 1   PHQ-9 Total Score 8 9 10  -- --   Flowsheet Row ED from 02/24/2023 in [facility] Emergency Department at [facility] Video Visit from 01/11/2022 in [facility] at [place] Admission (Discharged) from 07/29/2021 in [facility]  C-SSRS RISK CATEGORY No Risk No Risk No Risk     Assessment and Plan:  This patient is a 53 year old female with a history of fibromyalgia joint pain depression anxiety binge eating disorder, menopausal.  She no longer feels that her antidepressants are working so we will discontinue Prozac  in favor of Pristiq  50 mg daily along with the Wellbutrin  SR 150 mg twice daily for depression.  For now she will continue Vyvanse  70 mg every morning for ADD and binge eating and Valium  5 mg up to twice daily as needed for anxiety.  She will return to see me in 2 months Collaboration of Care: Collaboration of Care: Primary Care Provider AEB notes will be shared with PCP at patient's request  Patient/Guardian was advised Release of Information must be obtained prior to any record release in order to collaborate their care with an outside provider. Patient/Guardian was advised if they have not already done so to contact the registration department to sign all necessary forms in order for us  to release information regarding their care.   Consent: Patient/Guardian gives verbal consent for treatment and assignment of benefits for services provided during this visit. Patient/Guardian expressed understanding and agreed to proceed.    Barnie Gull, MD 08/14/2023, 11:15 AM

## 2023-09-06 ENCOUNTER — Encounter (HOSPITAL_COMMUNITY): Payer: Self-pay | Admitting: Psychiatry

## 2023-09-06 ENCOUNTER — Telehealth (INDEPENDENT_AMBULATORY_CARE_PROVIDER_SITE_OTHER): Admitting: Psychiatry

## 2023-09-06 DIAGNOSIS — F418 Other specified anxiety disorders: Secondary | ICD-10-CM

## 2023-09-06 DIAGNOSIS — F422 Mixed obsessional thoughts and acts: Secondary | ICD-10-CM

## 2023-09-06 DIAGNOSIS — F900 Attention-deficit hyperactivity disorder, predominantly inattentive type: Secondary | ICD-10-CM

## 2023-09-06 DIAGNOSIS — F50811 Binge eating disorder, moderate: Secondary | ICD-10-CM | POA: Diagnosis not present

## 2023-09-06 MED ORDER — FLUOXETINE HCL 40 MG PO CAPS
40.0000 mg | ORAL_CAPSULE | Freq: Every day | ORAL | 2 refills | Status: DC
Start: 2023-09-06 — End: 2023-11-13

## 2023-09-06 NOTE — Progress Notes (Signed)
 Virtual Visit via Video Note  I connected with Sheri Wood on 09/06/23 at 11:20 AM EDT by a video enabled telemedicine application and verified that I am speaking with the correct person using two identifiers.  Location: Patient: home Provider: office   I discussed the limitations of evaluation and management by telemedicine and the availability of in person appointments. The patient expressed understanding and agreed to proceed.      I discussed the assessment and treatment plan with the patient. The patient was provided an opportunity to ask questions and all were answered. The patient agreed with the plan and demonstrated an understanding of the instructions.   The patient was advised to call back or seek an in-person evaluation if the symptoms worsen or if the condition fails to improve as anticipated.  I provided 20 minutes of non-face-to-face time during this encounter.   Barnie Gull, MD  [facility] MD/PA/NP OP Progress Note  09/06/2023 11:34 AM Sheri Wood  MRN:  [id number]  Chief Complaint:  Chief Complaint  Patient presents with   Anxiety   Depression   Follow-up   ADD   HPI: This patient is a 53 year old married white female who lives with her husband in [place]. 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   The patient returns for follow-up after about 3 weeks regarding depression anxiety ADD and binge eating disorder.  She also suffers from chronic pain fibromyalgia and chronic fatigue.  The patient asked to come in today as a work in.  Last visit she stated she was more depressed and was having a lot of menopausal symptoms such as hot flashes and insomnia.  I switched her from Prozac  to Pristiq .  She has not been doing well with the Pristiq .  She has been crying a lot feeling very sad and down and more obsessional.  She is constantly worrying that something bad is going to happen to one of her family members.  She has lost a brother and a  mother and she feels like someone in the family is going to be next.  She also obsesses about food and admits constantly overeating.  I suggested we go back to the Prozac  and a higher dosage.  She was on 20 mg so we will increase to 40 mg.  She denies any thoughts of self-harm or suicide.  She also states that her PCP Dr. Shona put her on a new medicine for restless legs.  She had been on Mirapex  and she thinks it worked better.  She is going to talk with him about this as well.  She also asks about possible referral to transcranial magnetic therapy.  She is not interested in this and I can make a referral and she also is interested in returning to counseling. Visit Diagnosis:    ICD-10-CM   1. Depression with anxiety  F41.8     2. Attention deficit hyperactivity disorder (ADHD), predominantly inattentive type  F90.0     3. Moderate binge-eating disorder  F50.811     4. Mixed obsessional thoughts and acts  F42.2       Past Psychiatric History: Long-term outpatient treatment  Past Medical History:  Past Medical History:  Diagnosis Date   Allergy    seasonal   Anxiety    Depression    Fibromyalgia    Gastritis    history   GERD (gastroesophageal reflux disease)    otc med prn   IBS (irritable bowel syndrome)  Migraine    SVD (spontaneous vaginal delivery)    x 2   Urticaria, chronic    Vertigo     Past Surgical History:  Procedure Laterality Date   CARPOMETACARPEL SUSPENSION PLASTY Right 07/29/2021   Procedure: RIGHT THUMB TRAPEZIECTOMY AND SUSPENSIONPLASTY;  Surgeon: Murrell Drivers, MD;  Location: [facility];  Service: Orthopedics;  Laterality: Right;  75 MIN   CHOLECYSTECTOMY N/A 04/08/2020   Procedure: LAPAROSCOPIC CHOLECYSTECTOMY;  Surgeon: Kallie Manuelita BROCKS, MD;  Location: [facility];  Service: General;  Laterality: N/A;   COLONOSCOPY  2008   negative   DIAGNOSTIC LAPAROSCOPY     fibroids   DILATION AND CURETTAGE OF UTERUS     DILITATION &  CURRETTAGE/HYSTROSCOPY WITH NOVASURE ABLATION N/A 05/06/2013   Procedure: DILATATION & CURETTAGE/HYSTEROSCOPY WITH NOVASURE ABLATION;  Surgeon: Burnard LABOR. Kandyce, MD;  Location: [facility];  Service: Gynecology;  Laterality: N/A;   EYE SURGERY     bilateral lasik   FOOT SURGERY  2017   LAPAROSCOPIC BILATERAL SALPINGECTOMY Bilateral 05/06/2013   Procedure: LAPAROSCOPIC BILATERAL SALPINGECTOMY;  Surgeon: Burnard A. Kandyce, MD;  Location: [facility];  Service: Gynecology;  Laterality: Bilateral;   TENDON TRANSFER Right 07/29/2021   Procedure: TENDON TRANSFER;  Surgeon: Murrell Drivers, MD;  Location: [facility];  Service: Orthopedics;  Laterality: Right;   WISDOM TOOTH EXTRACTION      Family Psychiatric History: See below  Family History:  Family History  Problem Relation Age of Onset   Alcohol abuse Brother    Drug abuse Brother    Depression Mother    Anxiety disorder Mother    Anxiety disorder Brother    Depression Brother    Bipolar disorder Maternal Uncle    Anxiety disorder Paternal Grandfather    Depression Paternal Grandfather    Bipolar disorder Cousin    Breast cancer Maternal Aunt     Social History:  Social History   Socioeconomic History   Marital status: Married    Spouse name: Not on file   Number of children: Not on file   Years of education: Not on file   Highest education level: Not on file  Occupational History   Not on file  Tobacco Use   Smoking status: Former    Current packs/day: 0.00    Average packs/day: 0.8 packs/day for 10.0 years (7.5 ttl pk-yrs)    Types: Cigarettes    Start date: 02/07/1978    Quit date: 02/08/1988    Years since quitting: 35.6   Smokeless tobacco: Never  Vaping Use   Vaping status: Former  Substance and Sexual Activity   Alcohol use: No   Drug use: No   Sexual activity: Yes    Birth control/protection: None  Other Topics Concern   Not on file  Social History Narrative   Not on file   Social Drivers of Health    Financial Resource Strain: Not on file  Food Insecurity: Not on file  Transportation Needs: Not on file  Physical Activity: Not on file  Stress: Not on file  Social Connections: Not on file    Allergies:  Allergies  Allergen Reactions   Ciprofloxacin  Other (See Comments)    Severe muscle and joint pain   Cefzil [Cefprozil]     possible hives   Augmentin  [Amoxicillin -Pot Clavulanate] Nausea And Vomiting    Throat felt funny and gave pt anxiety   Bee Venom Swelling    Metabolic Disorder Labs: No results found for: HGBA1C, MPG No  results found for: PROLACTIN Lab Results  Component Value Date   CHOL 192 04/02/2015   TRIG 69 04/02/2015   HDL 60 04/02/2015   CHOLHDL 3.2 04/02/2015   VLDL 14 04/02/2015   LDLCALC 118 (H) 04/02/2015   Lab Results  Component Value Date   TSH 0.922 01/14/2020   TSH 1.940 03/23/2015    Therapeutic Level Labs: No results found for: LITHIUM No results found for: VALPROATE No results found for: CBMZ  Current Medications: Current Outpatient Medications  Medication Sig Dispense Refill   FLUoxetine  (PROZAC ) 40 MG capsule Take 1 capsule (40 mg total) by mouth daily. 30 capsule 2   albuterol  (VENTOLIN  HFA) 108 (90 Base) MCG/ACT inhaler Inhale 2 puffs into the lungs every 6 (six) hours as needed for wheezing or shortness of breath. 8 g 0   buPROPion  (WELLBUTRIN  SR) 150 MG 12 hr tablet Take 1 tablet (150 mg total) by mouth 2 (two) times daily. 60 tablet 5   diazepam  (VALIUM ) 5 MG tablet Take 1 tablet (5 mg total) by mouth 2 (two) times daily as needed. 60 tablet 2   eletriptan  (RELPAX ) 40 MG tablet Take 1 tablet (40 mg total) by mouth every 2 (two) hours as needed for migraine (May repeat in 2 hours if headache persists or recurs.). 10 tablet 0   lisdexamfetamine (VYVANSE ) 70 MG capsule Take 1 capsule (70 mg total) by mouth daily. 30 capsule 0   lisdexamfetamine (VYVANSE ) 70 MG capsule Take 1 capsule (70 mg total) by mouth daily. 30  capsule 0   lisdexamfetamine (VYVANSE ) 70 MG capsule Take 1 capsule (70 mg total) by mouth daily. 30 capsule 0   lisdexamfetamine (VYVANSE ) 70 MG capsule Take 1 capsule (70 mg total) by mouth daily. 30 capsule 0   No current facility-administered medications for this visit.     Musculoskeletal: Strength & Muscle Tone: within normal limits Gait & Station: normal Patient leans: N/A  Psychiatric Specialty Exam: Review of Systems  Psychiatric/Behavioral:  Positive for dysphoric mood and sleep disturbance. The patient is nervous/anxious.   All other systems reviewed and are negative.   There were no vitals taken for this visit.There is no height or weight on file to calculate BMI.  General Appearance: Casual, Neat, and Well Groomed  Eye Contact:  Good  Speech:  Clear and Coherent  Volume:  Normal  Mood:  Depressed  Affect:  Depressed and Tearful  Thought Process:  Goal Directed  Orientation:  Full (Time, Place, and Person)  Thought Content: Obsessions and Rumination   Suicidal Thoughts:  No  Homicidal Thoughts:  No  Memory:  Immediate;   Good Recent;   Good Remote;   Good  Judgement:  Good  Insight:  Good  Psychomotor Activity:  Normal  Concentration:  Concentration: Good and Attention Span: Good  Recall:  Good  Fund of Knowledge: Good  Language: Good  Akathisia:  No  Handed:  Right  AIMS (if indicated): not done  Assets:  Communication Skills Desire for Improvement Resilience Social Support Talents/Skills  ADL's:  Intact  Cognition: WNL  Sleep:  Fair   Screenings: GAD-7    Flowsheet Row Telemedicine from 06/07/2019 in [facility] Office Visit from 12/28/2017 in [facility]  Total GAD-7 Score 14 12   PHQ2-9    Flowsheet Row Video Visit from 03/15/2022 in [facility] at [place] Video Visit from 01/11/2022 in [facility] at [place] Video Visit from 06/29/2021 in [facility]  Outpatient Behavior[facility] at [facility] Video Visit from 04/29/2021 in [facility] Health at [place] Video Visit from 03/18/2021 in [facility] at [facility] Total Score 2 4 2 1 1   PHQ-9 Total Score 8 9 10  -- --   Flowsheet Row ED from 02/24/2023 in [facility] Emergency Department at [facility] Video Visit from 01/11/2022 in [facility] at [place] Admission (Discharged) from 07/29/2021 in [facility]  C-SSRS RISK CATEGORY No Risk No Risk No Risk     Assessment and Plan: This patient is a 53 year old female with a history of fibromyalgia chronic pain depression anxiety binge eating disorder and menopausal symptoms.  The Pristiq  is that worked for her and she is feeling more depressed and tearful although not suicidal.  Will return to Prozac  at a higher dose-40 mg daily.  She will continue Wellbutrin  SR 150 mg twice daily also for depression, Vyvanse  70 mg every morning for ADD and binge eating and Valium  5 mg up to twice daily as needed for anxiety.  Collaboration of Care: Collaboration of Care: Referral or follow-up with counselor/therapist AEB patient will be referred to [facility] for therapy  Patient/Guardian was advised Release of Information must be obtained prior to any record release in order to collaborate their care with an outside provider. Patient/Guardian was advised if they have not already done so to contact the registration department to sign all necessary forms in order for us  to release information regarding their care.   Consent: Patient/Guardian gives verbal consent for treatment and assignment of benefits for services provided during this visit. Patient/Guardian expressed understanding and agreed to proceed.    Barnie Gull, MD 09/06/2023, 11:34 AM

## 2023-09-08 ENCOUNTER — Telehealth (HOSPITAL_COMMUNITY): Payer: Self-pay

## 2023-09-08 NOTE — Telephone Encounter (Signed)
 [employer] Coordinator submitted benefit verification through [facility] system Doctor, general practice) for [employer] insurance payer - [employer] Coordinator will wait for verification before screening pt to ensure state plan is not medicaid affiliated.

## 2023-09-28 ENCOUNTER — Ambulatory Visit (INDEPENDENT_AMBULATORY_CARE_PROVIDER_SITE_OTHER): Payer: 59 | Admitting: Dermatology

## 2023-09-28 VITALS — BP 116/82

## 2023-09-28 DIAGNOSIS — L738 Other specified follicular disorders: Secondary | ICD-10-CM | POA: Diagnosis not present

## 2023-09-28 DIAGNOSIS — L821 Other seborrheic keratosis: Secondary | ICD-10-CM

## 2023-09-28 DIAGNOSIS — L299 Pruritus, unspecified: Secondary | ICD-10-CM | POA: Diagnosis not present

## 2023-09-28 DIAGNOSIS — L2489 Irritant contact dermatitis due to other agents: Secondary | ICD-10-CM | POA: Diagnosis not present

## 2023-09-28 MED ORDER — TRETINOIN 0.025 % EX CREA
TOPICAL_CREAM | Freq: Every day | CUTANEOUS | 1 refills | Status: DC
Start: 2023-09-28 — End: 2023-11-27

## 2023-09-28 MED ORDER — TRIAMCINOLONE ACETONIDE 0.1 % EX CREA: 1.0000 | TOPICAL_CREAM | Freq: Two times a day (BID) | CUTANEOUS | 3 refills | Status: DC | PRN

## 2023-09-28 NOTE — Progress Notes (Deleted)
   New Patient Visit   Subjective  Sheri Wood is a 53 y.o. female who presents for the following: Rash  ***  The following portions of the chart were reviewed this encounter and updated as appropriate: medications, allergies, medical history  Review of Systems:  No other skin or systemic complaints except as noted in HPI or Assessment and Plan.  Objective  Well appearing patient in no apparent distress; mood and affect are within normal limits.  A focused examination was performed of the following areas: ***  Relevant exam findings are noted in the Assessment and Plan.    Assessment & Plan     RASH Exam: ***  Wellcontrolled vs flared vs notatgoal IF THIS IS CHRONIC***  Treatment Plan: ***   No follow-ups on file.  ***  Documentation: I have reviewed the above documentation for accuracy and completeness, and I agree with the above.  Delon Lenis, DO

## 2023-09-28 NOTE — Progress Notes (Signed)
   New Patient Visit   Subjective  Sheri Wood is a 53 y.o. female who presents for the following: Rash  Patient states that she started having an itchy rash all over torso, chest, and back. She saw a dermatologist in her hometown where a biopsy was performed but it was inconclusive. Rash went away after 6 months. Neck area is now itchy. The itchiness flares about three-four times a week at random. She has tried benadryl which helps. Denies red bumps  C/o flesh colored papules scattered throughout bilateral cheek area. They are raised but denies itchiness.   The patient has spots, moles and lesions to be evaluated, some may be new or changing and the patient may have concern these could be cancer.   The following portions of the chart were reviewed this encounter and updated as appropriate: medications, allergies, medical history  Review of Systems:  No other skin or systemic complaints except as noted in HPI or Assessment and Plan.  Objective  Well appearing patient in no apparent distress; mood and affect are within normal limits.  A focused examination was performed of the following areas: neck  Relevant exam findings are noted in the Assessment and Plan.    Assessment & Plan   1. Neck Pruritus with Irritant Dermatitis - Assessment: Patient reports sporadic itchiness on the neck area, occurring 3-4 times a week for the past 2 weeks. No visible rash or red bumps present during itching episodes. Physical examination reveals some dryness on the neck, possibly residual irritation. The condition is likely irritant dermatitis exacerbated by fragrance use and sun exposure. - Plan:    Prescribe triamcinolone  0.1% cream twice daily until itching stops, up to 2 weeks    After 2 weeks, discontinue for 2 weeks before resuming if needed    Recommend La Roche-Posay Double Repair Tolerant with UV as a moisturizer with sunscreen    Advise patient to discontinue perfume use  2.  Sebaceous Hyperplasia - Assessment: Patient presents with fleshy papules in the neck area, diagnosed as sebaceous hyperplasia. These are benign, overgrown oil glands that are not dangerous but can be cosmetically concerning. - Plan:    Prescribe tretinoin  0.025% cream    Apply a pea-sized amount two nights a week initially    Increase to three nights a week if no irritation occurs    Follow application with moisturizer    Recommend Avene's balm as a nighttime moisturizer to improve tretinoin  tolerance  3. Seborrheic Keratosis - Assessment: Brown waxy papule noted on the left shoulder, diagnosed as a seborrheic keratosis. This is a benign skin growth that does not require treatment. - Plan:    No intervention required    Continue monitoring during future skin examinations  Follow-up in 4 months to assess improvement and perform full skin cancer screening.     Return in about 4 months (around 01/28/2024) for TBSE, Seb hyperplasia, and irritant dermatitis .  I, Gordan Beams, CMA, am acting as scribe for [employer], DO.   Documentation: I have reviewed the above documentation for accuracy and completeness, and I agree with the above.  Delon Lenis, DO

## 2023-09-28 NOTE — Patient Instructions (Addendum)
 Date: Thu Sep 28 2023  Hello Sheri Wood,  Thank you for visiting today. Here is a summary of the key instructions:  - Medications:   - Use triamcinolone  0.1% cream twice a day for up to 2 weeks   - Take a 2-week break before using again if needed   - Use tretinoin  0.025% cream two nights a week   - Increase to three nights if no irritation occurs   - Apply a pea-sized amount   - Follow with moisturizer  - Skin Care:   - Use La Roche-Posay Double Repair Tolerant with UV as a daytime moisturizer with sunscreen   - Use Avene balm as a nighttime moisturizer   - Stop using perfume   - Continue sunscreen application  - Follow-up:   - Return in 4 months for check on itching after stopping perfume use   - Evaluate tretinoin  effects   - Full skin cancer screening  - Other Instructions:   - Message through MyChart for any questions or concerns  Please reach out if you have any questions or concerns.  Warm regards,  Dr. Delon Lenis [facility]  Important Information  Due to recent changes in healthcare laws, you may see results of your pathology and/or laboratory studies on MyChart before the doctors have had a chance to review them. We understand that in some cases there may be results that are confusing or concerning to you. Please understand that not all results are received at the same time and often the doctors may need to interpret multiple results in order to provide you with the best plan of care or course of treatment. Therefore, we ask that you please give us  2 business days to thoroughly review all your results before contacting the office for clarification. Should we see a critical lab result, you will be contacted sooner.   If You Need Anything After Your Visit  If you have any questions or concerns for your doctor, please call our main line at [phone] If no one answers, please leave a voicemail as directed and we will return your call as soon as possible. Messages  left after 4 pm will be answered the following business day.   You may also send us  a message via MyChart. We typically respond to MyChart messages within 1-2 business days.  For prescription refills, please ask your pharmacy to contact our office. Our fax number is [phone].  If you have an urgent issue when the clinic is closed that cannot wait until the next business day, you can page your doctor at the number below.    Please note that while we do our best to be available for urgent issues outside of office hours, we are not available 24/7.   If you have an urgent issue and are unable to reach us , you may choose to seek medical care at your doctor's office, retail clinic, urgent care center, or emergency room.  If you have a medical emergency, please immediately call 911 or go to the emergency department. In the event of inclement weather, please call our main line at [phone] for an update on the status of any delays or closures.  Dermatology Medication Tips: Please keep the boxes that topical medications come in in order to help keep track of the instructions about where and how to use these. Pharmacies typically print the medication instructions only on the boxes and not directly on the medication tubes.   If your medication is too expensive, please contact  our office at [phone] or send us  a message through MyChart.   We are unable to tell what your co-pay for medications will be in advance as this is different depending on your insurance coverage. However, we may be able to find a substitute medication at lower cost or fill out paperwork to get insurance to cover a needed medication.   If a prior authorization is required to get your medication covered by your insurance company, please allow us  1-2 business days to complete this process.  Drug prices often vary depending on where the prescription is filled and some pharmacies may offer cheaper prices.  The website  www.goodrx.com contains coupons for medications through different pharmacies. The prices here do not account for what the cost may be with help from insurance (it may be cheaper with your insurance), but the website can give you the price if you did not use any insurance.  - You can print the associated coupon and take it with your prescription to the pharmacy.  - You may also stop by our office during regular business hours and pick up a GoodRx coupon card.  - If you need your prescription sent electronically to a different pharmacy, notify our office through [facility] or by phone at [phone]

## 2023-10-02 ENCOUNTER — Encounter: Payer: Self-pay | Admitting: Dermatology

## 2023-10-19 ENCOUNTER — Ambulatory Visit: Admitting: Internal Medicine

## 2023-11-10 ENCOUNTER — Encounter: Payer: Self-pay | Admitting: Family Medicine

## 2023-11-13 ENCOUNTER — Encounter (HOSPITAL_COMMUNITY): Payer: Self-pay | Admitting: Psychiatry

## 2023-11-13 ENCOUNTER — Other Ambulatory Visit (HOSPITAL_COMMUNITY): Payer: Self-pay | Admitting: Psychiatry

## 2023-11-13 ENCOUNTER — Telehealth (HOSPITAL_COMMUNITY): Payer: Self-pay | Admitting: *Deleted

## 2023-11-13 MED ORDER — LISDEXAMFETAMINE DIMESYLATE 70 MG PO CAPS: 70.0000 mg | ORAL_CAPSULE | Freq: Every day | ORAL | 0 refills | Status: DC

## 2023-11-13 MED ORDER — FLUOXETINE HCL 40 MG PO CAPS: 40.0000 mg | ORAL_CAPSULE | Freq: Every day | ORAL | 2 refills | Status: DC

## 2023-11-13 NOTE — Telephone Encounter (Signed)
 Patient calling stating she is needing refills for her Vyvanse  and Fluoxetine  sent to [facility]

## 2023-11-13 NOTE — Telephone Encounter (Signed)
 sent

## 2023-11-14 ENCOUNTER — Other Ambulatory Visit: Payer: Self-pay | Admitting: Family Medicine

## 2023-11-14 DIAGNOSIS — M25562 Pain in left knee: Secondary | ICD-10-CM

## 2023-11-16 ENCOUNTER — Encounter (HOSPITAL_COMMUNITY): Payer: Self-pay | Admitting: Psychiatry

## 2023-11-16 ENCOUNTER — Telehealth (INDEPENDENT_AMBULATORY_CARE_PROVIDER_SITE_OTHER): Admitting: Psychiatry

## 2023-11-16 DIAGNOSIS — F900 Attention-deficit hyperactivity disorder, predominantly inattentive type: Secondary | ICD-10-CM | POA: Diagnosis not present

## 2023-11-16 DIAGNOSIS — F325 Major depressive disorder, single episode, in full remission: Secondary | ICD-10-CM | POA: Diagnosis not present

## 2023-11-16 DIAGNOSIS — F50811 Binge eating disorder, moderate: Secondary | ICD-10-CM | POA: Diagnosis not present

## 2023-11-16 MED ORDER — LISDEXAMFETAMINE DIMESYLATE 70 MG PO CAPS: 70.0000 mg | ORAL_CAPSULE | Freq: Every day | ORAL | 0 refills | Status: AC

## 2023-11-16 MED ORDER — LISDEXAMFETAMINE DIMESYLATE 70 MG PO CAPS: 70.0000 mg | ORAL_CAPSULE | Freq: Every day | ORAL | 0 refills | Status: DC

## 2023-11-16 MED ORDER — FLUOXETINE HCL 40 MG PO CAPS: 40.0000 mg | ORAL_CAPSULE | Freq: Every day | ORAL | 2 refills | Status: AC

## 2023-11-16 MED ORDER — BUPROPION HCL ER (SR) 150 MG PO TB12: 150.0000 mg | ORAL_TABLET | Freq: Two times a day (BID) | ORAL | 5 refills | Status: AC

## 2023-11-16 NOTE — Progress Notes (Signed)
 Virtual Visit via Telephone Note  I connected with Sheri Wood on 11/16/23 at  4:20 PM EDT by telephone and verified that I am speaking with the correct person using two identifiers.  Location: Patient: home Provider: office   I discussed the limitations, risks, security and privacy concerns of performing an evaluation and management service by telephone and the availability of in person appointments. I also discussed with the patient that there may be a patient responsible charge related to this service. The patient expressed understanding and agreed to proceed.      I discussed the assessment and treatment plan with the patient. The patient was provided an opportunity to ask questions and all were answered. The patient agreed with the plan and demonstrated an understanding of the instructions.   The patient was advised to call back or seek an in-person evaluation if the symptoms worsen or if the condition fails to improve as anticipated.  I provided 20 minutes of non-face-to-face time during this encounter.   Barnie Gull, MD  [facility] MD/PA/NP OP Progress Note  11/16/2023 4:35 PM Sheri Wood  MRN:  [id number]  Chief Complaint:  Chief Complaint  Patient presents with   Depression   Anxiety   Follow-up   HPI: This patient is a 53 year old married white female who lives with her husband in [place].  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  She still suffers from chronic pain fibromyalgia and chronic fatigue.  The patient states that she is feeling much better since we increase the Prozac .  She is now on 40 mg along with the Wellbutrin .  She states that her mood is better her energy is improved.  She still feels like she is snacking too much and cannot lose weight.  She tried to get GLP-1 through her PCP but her insurance will not  cover it.  She is trying to exercise.  She states that her mood is good but she still dealing with menopausal symptoms such as hot flashes.  She denies significant anxiety.  Her focus is fairly good with the Vyvanse .  She denies any thoughts of suicide or self-harm Visit Diagnosis:    ICD-10-CM   1. Attention deficit hyperactivity disorder (ADHD), predominantly inattentive type  F90.0     2. Moderate binge-eating disorder  F50.811     3. Major depressive disorder in full remission, unspecified whether recurrent  F32.5       Past Psychiatric History: Long-term outpatient treatment  Past Medical History:  Past Medical History:  Diagnosis Date   Allergy    seasonal   Anxiety    Depression    Fibromyalgia    Gastritis    history   GERD (gastroesophageal reflux disease)    otc med prn   IBS (irritable bowel syndrome)    Migraine    SVD (spontaneous vaginal delivery)    x 2   Urticaria, chronic    Vertigo     Past Surgical History:  Procedure Laterality Date   CARPOMETACARPEL SUSPENSION PLASTY Right 07/29/2021   Procedure: RIGHT THUMB TRAPEZIECTOMY AND SUSPENSIONPLASTY;  Surgeon: Murrell Drivers, MD;  Location: [facility];  Service: Orthopedics;  Laterality: Right;  75 MIN   CHOLECYSTECTOMY N/A 04/08/2020   Procedure: LAPAROSCOPIC CHOLECYSTECTOMY;  Surgeon: Kallie Manuelita BROCKS, MD;  Location: [facility];  Service: General;  Laterality: N/A;  COLONOSCOPY  2008   negative   DIAGNOSTIC LAPAROSCOPY     fibroids   DILATION AND CURETTAGE OF UTERUS     DILITATION & CURRETTAGE/HYSTROSCOPY WITH NOVASURE ABLATION N/A 05/06/2013   Procedure: DILATATION & CURETTAGE/HYSTEROSCOPY WITH NOVASURE ABLATION;  Surgeon: Burnard LABOR. Kandyce, MD;  Location: [facility];  Service: Gynecology;  Laterality: N/A;   EYE SURGERY     bilateral lasik   FOOT SURGERY  2017   LAPAROSCOPIC BILATERAL SALPINGECTOMY Bilateral 05/06/2013   Procedure: LAPAROSCOPIC BILATERAL SALPINGECTOMY;  Surgeon: Burnard A. Kandyce,  MD;  Location: [facility];  Service: Gynecology;  Laterality: Bilateral;   TENDON TRANSFER Right 07/29/2021   Procedure: TENDON TRANSFER;  Surgeon: Murrell Drivers, MD;  Location: [facility];  Service: Orthopedics;  Laterality: Right;   WISDOM TOOTH EXTRACTION      Family Psychiatric History: See below  Family History:  Family History  Problem Relation Age of Onset   Alcohol abuse Brother    Drug abuse Brother    Depression Mother    Anxiety disorder Mother    Anxiety disorder Brother    Depression Brother    Bipolar disorder Maternal Uncle    Anxiety disorder Paternal Grandfather    Depression Paternal Grandfather    Bipolar disorder Cousin    Breast cancer Maternal Aunt     Social History:  Social History   Socioeconomic History   Marital status: Married    Spouse name: Not on file   Number of children: Not on file   Years of education: Not on file   Highest education level: Not on file  Occupational History   Not on file  Tobacco Use   Smoking status: Former    Current packs/day: 0.00    Average packs/day: 0.8 packs/day for 10.0 years (7.5 ttl pk-yrs)    Types: Cigarettes    Start date: 02/07/1978    Quit date: 02/08/1988    Years since quitting: 35.7   Smokeless tobacco: Never  Vaping Use   Vaping status: Former  Substance and Sexual Activity   Alcohol use: No   Drug use: No   Sexual activity: Yes    Birth control/protection: None  Other Topics Concern   Not on file  Social History Narrative   Not on file   Social Drivers of Health   Financial Resource Strain: Not on file  Food Insecurity: Not on file  Transportation Needs: Not on file  Physical Activity: Not on file  Stress: Not on file  Social Connections: Not on file    Allergies:  Allergies  Allergen Reactions   Ciprofloxacin  Other (See Comments)    Severe muscle and joint pain   Cefzil [Cefprozil]     possible hives   Augmentin  [Amoxicillin -Pot Clavulanate] Nausea And Vomiting     Throat felt funny and gave pt anxiety   Bee Venom Swelling    Metabolic Disorder Labs: No results found for: HGBA1C, MPG No results found for: PROLACTIN Lab Results  Component Value Date   CHOL 192 04/02/2015   TRIG 69 04/02/2015   HDL 60 04/02/2015   CHOLHDL 3.2 04/02/2015   VLDL 14 04/02/2015   LDLCALC 118 (H) 04/02/2015   Lab Results  Component Value Date   TSH 0.922 01/14/2020   TSH 1.940 03/23/2015    Therapeutic Level Labs: No results found for: LITHIUM No results found for: VALPROATE No results found for: CBMZ  Current Medications: Current Outpatient Medications  Medication Sig Dispense Refill   albuterol  (VENTOLIN   HFA) 108 (90 Base) MCG/ACT inhaler Inhale 2 puffs into the lungs every 6 (six) hours as needed for wheezing or shortness of breath. 8 g 0   buPROPion  (WELLBUTRIN  SR) 150 MG 12 hr tablet Take 1 tablet (150 mg total) by mouth 2 (two) times daily. 60 tablet 5   diazepam  (VALIUM ) 5 MG tablet Take 1 tablet (5 mg total) by mouth 2 (two) times daily as needed. 60 tablet 2   eletriptan  (RELPAX ) 40 MG tablet Take 1 tablet (40 mg total) by mouth every 2 (two) hours as needed for migraine (May repeat in 2 hours if headache persists or recurs.). 10 tablet 0   FLUoxetine  (PROZAC ) 40 MG capsule Take 1 capsule (40 mg total) by mouth daily. 30 capsule 2   lisdexamfetamine (VYVANSE ) 70 MG capsule Take 1 capsule (70 mg total) by mouth daily. 30 capsule 0   lisdexamfetamine (VYVANSE ) 70 MG capsule Take 1 capsule (70 mg total) by mouth daily. 30 capsule 0   lisdexamfetamine (VYVANSE ) 70 MG capsule Take 1 capsule (70 mg total) by mouth daily. 30 capsule 0   tretinoin  (RETIN-A ) 0.025 % cream Apply topically at bedtime. Apply a pea size amount twice a week. If tolerated well, after two weeks, apply three times a week 45 g 1   triamcinolone  cream (KENALOG ) 0.1 % Apply 1 Application topically 2 (two) times daily as needed (Rash). 240 g 3   No current  facility-administered medications for this visit.     Musculoskeletal: Strength & Muscle Tone: within normal limits Gait & Station: normal Patient leans: N/A  Psychiatric Specialty Exam: Review of Systems  All other systems reviewed and are negative.   There were no vitals taken for this visit.There is no height or weight on file to calculate BMI.  General Appearance: Casual and Fairly Groomed  Eye Contact:  Good  Speech:  Clear and Coherent  Volume:  Normal  Mood:  Euthymic  Affect:  Congruent  Thought Process:  Goal Directed  Orientation:  Full (Time, Place, and Person)  Thought Content: WDL   Suicidal Thoughts:  No  Homicidal Thoughts:  No  Memory:  Immediate;   Good Recent;   Good Remote;   Good  Judgement:  Good  Insight:  Good  Psychomotor Activity:  Normal  Concentration:  Concentration: Good and Attention Span: Good  Recall:  Good  Fund of Knowledge: Good  Language: Good  Akathisia:  No  Handed:  Right  AIMS (if indicated): not done  Assets:  Communication Skills Desire for Improvement Physical Health Resilience Social Support Talents/Skills  ADL's:  Intact  Cognition: WNL  Sleep:  Fair   Screenings: GAD-7    Flowsheet Row Telemedicine from 06/07/2019 in [facility] Office Visit from 12/28/2017 in [facility]  Total GAD-7 Score 14 12   PHQ2-9    Flowsheet Row Video Visit from 03/15/2022 in [facility] [place] Video Visit from 01/11/2022 in [facility] at [place] Video Visit from 06/29/2021 in [facility] at [employer] Video Visit from 04/29/2021 in [facility] at [place] Video Visit from 03/18/2021 in [facility] at [facility] Total Score 2 4 2 1 1   PHQ-9 Total Score 8 9 10  -- --   Flowsheet Row ED from 02/24/2023 in [facility] at [facility] Video Visit from 01/11/2022 in [facility] at [place] Admission (Discharged)  from 07/29/2021 in [facility]  C-SSRS RISK CATEGORY No Risk No Risk No Risk     Assessment and Plan: This patient is a 53 year old female with a history of fibromyalgia chronic pain major depressive disorder generalized anxiety, binge eating disorder and menopausal symptoms.  She is doing much better in terms of her depression with the Prozac  40 mg daily along with Wellbutrin  SR 150 mg twice daily.  She will continue these as well as Vyvanse  70 mg every morning for ADD and binge eating disorder.  She is rarely using the Valium  for anxiety.  She will return to see me in 3 months  Collaboration of Care: Collaboration of Care: Primary Care Provider AEB notes will be shared with PCP at patient's request  Patient/Guardian was advised Release of Information must be obtained prior to any record release in order to collaborate their care with an outside provider. Patient/Guardian was advised if they have not already done so to contact the registration department to sign all necessary forms in order for us  to release information regarding their care.   Consent: Patient/Guardian gives verbal consent for treatment and assignment of benefits for services provided during this visit. Patient/Guardian expressed understanding and agreed to proceed.    Barnie Gull, MD 11/16/2023, 4:35 PM

## 2023-11-27 ENCOUNTER — Other Ambulatory Visit: Payer: Self-pay

## 2023-11-27 ENCOUNTER — Encounter: Payer: Self-pay | Admitting: Dermatology

## 2023-11-27 ENCOUNTER — Telehealth: Admitting: Physician Assistant

## 2023-11-27 DIAGNOSIS — T3695XA Adverse effect of unspecified systemic antibiotic, initial encounter: Secondary | ICD-10-CM | POA: Diagnosis not present

## 2023-11-27 DIAGNOSIS — R3989 Other symptoms and signs involving the genitourinary system: Secondary | ICD-10-CM

## 2023-11-27 DIAGNOSIS — R238 Other skin changes: Secondary | ICD-10-CM

## 2023-11-27 DIAGNOSIS — B379 Candidiasis, unspecified: Secondary | ICD-10-CM

## 2023-11-27 MED ORDER — TRETINOIN 0.025 % EX CREA: TOPICAL_CREAM | Freq: Every day | CUTANEOUS | 2 refills | Status: AC

## 2023-11-27 MED ORDER — FLUCONAZOLE 150 MG PO TABS: 150.0000 mg | ORAL_TABLET | ORAL | 0 refills | Status: DC | PRN

## 2023-11-27 MED ORDER — NITROFURANTOIN MONOHYD MACRO 100 MG PO CAPS: 100.0000 mg | ORAL_CAPSULE | Freq: Two times a day (BID) | ORAL | 0 refills | Status: AC

## 2023-11-27 NOTE — Progress Notes (Signed)
 We are sorry that you are not feeling well.  Here is how we plan to help!  Based on what you shared with me it looks like you most likely have a simple urinary tract infection.  A UTI (Urinary Tract Infection) is a bacterial infection of the bladder.  Most cases of urinary tract infections are simple to treat but a key part of your care is to encourage you to drink plenty of fluids and watch your symptoms carefully.  I have prescribed MacroBid 100 mg twice a day for 5 days.  Your symptoms should gradually improve. Call us  if the burning in your urine worsens, you develop worsening fever, back pain or pelvic pain or if your symptoms do not resolve after completing the antibiotic.  Urinary tract infections can be prevented by drinking plenty of water to keep your body hydrated.  Also be sure when you wipe, wipe from front to back and don't hold it in!  If possible, empty your bladder every 4 hours.  Diflucan given as prophylaxis as patient tends to get vaginal yeast infections with antibiotic use.  Your e-visit answers were reviewed by a board certified advanced clinical practitioner to complete your personal care plan.  Depending on the condition, your plan could have included both over the counter or prescription medications.  If there is a problem please reply  once you have received a response from your provider.  Your safety is important to us .  If you have drug allergies check your prescription carefully.    You can use MyChart to ask questions about today's visit, request a non-urgent call back, or ask for a work or school excuse for 24 hours related to this e-Visit. If it has been greater than 24 hours you will need to follow up with your provider, or enter a new e-Visit to address those concerns.   You will get an e-mail in the next two days asking about your experience.  I hope that your e-visit has been valuable and will speed your recovery. Thank you for using e-visits.  I have  spent 5 minutes in review of e-visit questionnaire, review and updating patient chart, medical decision making and response to patient.   Delon CHRISTELLA Dickinson, PA-C

## 2023-11-30 ENCOUNTER — Telehealth (HOSPITAL_COMMUNITY): Admitting: Psychiatry

## 2023-12-12 ENCOUNTER — Ambulatory Visit: Admitting: Internal Medicine

## 2023-12-21 ENCOUNTER — Other Ambulatory Visit (HOSPITAL_COMMUNITY): Payer: Self-pay | Admitting: Family Medicine

## 2023-12-21 DIAGNOSIS — R109 Unspecified abdominal pain: Secondary | ICD-10-CM

## 2023-12-27 ENCOUNTER — Ambulatory Visit (HOSPITAL_COMMUNITY)
Admission: RE | Admit: 2023-12-27 | Discharge: 2023-12-27 | Disposition: A | Discharge status: Home / Self Care | Source: Ambulatory Visit | Attending: Family Medicine | Admitting: Family Medicine

## 2023-12-27 DIAGNOSIS — R109 Unspecified abdominal pain: Secondary | ICD-10-CM | POA: Insufficient documentation

## 2023-12-30 ENCOUNTER — Encounter

## 2023-12-30 ENCOUNTER — Telehealth: Admitting: Family Medicine

## 2023-12-30 DIAGNOSIS — R3989 Other symptoms and signs involving the genitourinary system: Secondary | ICD-10-CM | POA: Diagnosis not present

## 2023-12-30 DIAGNOSIS — R399 Unspecified symptoms and signs involving the genitourinary system: Secondary | ICD-10-CM

## 2023-12-30 MED ORDER — TETRACYCLINE HCL 500 MG PO CAPS: 500.0000 mg | ORAL_CAPSULE | Freq: Four times a day (QID) | ORAL | 0 refills | Status: AC

## 2023-12-30 NOTE — Progress Notes (Signed)
   Thank you for the details you included in the comment boxes. Those details are very helpful in determining the best course of treatment for you and help us  to provide the best care.Because of the complex nature of your symptoms, we recommend that you schedule a Virtual Urgent Care video visit in order for the provider to better assess what is going on.  The provider will be able to give you a more accurate diagnosis and treatment plan if we can more freely discuss your symptoms and with the addition of a virtual examination.   If you change your visit to a video visit, we will bill your insurance (similar to an office visit) and you will not be charged for this e-Visit. You will be able to stay at home and speak with the first available [facility] advanced practice provider. The link to do a video visit is in the drop down Menu tab of your Welcome screen in MyChart.    I have spent 5 minutes in review of e-visit questionnaire, review and updating patient chart, medical decision making and response to patient.   Roosvelt Mater, PA-C

## 2023-12-30 NOTE — Patient Instructions (Signed)
 Sheri Wood, thank you for joining Roosvelt Mater, PA-C for today's virtual visit.  While this provider is not your primary care provider (PCP), if your PCP is located in our provider database this encounter information will be shared with them immediately following your visit.   A [facility] MyChart account gives you access to today's visit and all your visits, tests, and labs performed at [facility]  click here if you don't have a [facility] MyChart account or go to [email or web address]  Consent: (Patient) Sheri Wood provided verbal consent for this virtual visit at the beginning of the encounter.  Current Medications:  Current Outpatient Medications:    tetracycline  (SUMYCIN ) 500 MG capsule, Take 1 capsule (500 mg total) by mouth 4 (four) times daily for 5 days., Disp: 20 capsule, Rfl: 0   albuterol  (VENTOLIN  HFA) 108 (90 Base) MCG/ACT inhaler, Inhale 2 puffs into the lungs every 6 (six) hours as needed for wheezing or shortness of breath., Disp: 8 g, Rfl: 0   buPROPion  (WELLBUTRIN  SR) 150 MG 12 hr tablet, Take 1 tablet (150 mg total) by mouth 2 (two) times daily., Disp: 60 tablet, Rfl: 5   diazepam  (VALIUM ) 5 MG tablet, Take 1 tablet (5 mg total) by mouth 2 (two) times daily as needed., Disp: 60 tablet, Rfl: 2   eletriptan  (RELPAX ) 40 MG tablet, Take 1 tablet (40 mg total) by mouth every 2 (two) hours as needed for migraine (May repeat in 2 hours if headache persists or recurs.)., Disp: 10 tablet, Rfl: 0   fluconazole  (DIFLUCAN ) 150 MG tablet, Take 1 tablet (150 mg total) by mouth every 3 (three) days as needed., Disp: 2 tablet, Rfl: 0   FLUoxetine  (PROZAC ) 40 MG capsule, Take 1 capsule (40 mg total) by mouth daily., Disp: 30 capsule, Rfl: 2   lisdexamfetamine (VYVANSE ) 70 MG capsule, Take 1 capsule (70 mg total) by mouth daily., Disp: 30 capsule, Rfl: 0   lisdexamfetamine (VYVANSE ) 70 MG capsule, Take 1 capsule (70 mg total) by mouth daily., Disp: 30 capsule,  Rfl: 0   lisdexamfetamine (VYVANSE ) 70 MG capsule, Take 1 capsule (70 mg total) by mouth daily., Disp: 30 capsule, Rfl: 0   nitrofurantoin , macrocrystal-monohydrate, (MACROBID ) 100 MG capsule, Take 1 capsule (100 mg total) by mouth 2 (two) times daily., Disp: 10 capsule, Rfl: 0   tretinoin  (RETIN-A ) 0.025 % cream, Apply topically at bedtime. Apply a pea size amount twice a week. If tolerated well, after two weeks, apply three times a week, Disp: 45 g, Rfl: 2   Medications ordered in this encounter:  Meds ordered this encounter  Medications   tetracycline  (SUMYCIN ) 500 MG capsule    Sig: Take 1 capsule (500 mg total) by mouth 4 (four) times daily for 5 days.    Dispense:  20 capsule    Refill:  0     *If you need refills on other medications prior to your next appointment, please contact your pharmacy*  Follow-Up: Call back or seek an in-person evaluation if the symptoms worsen or if the condition fails to improve as anticipated.  [facility] [phone]  Other Instructions Urinary Tract Infection, Female A urinary tract infection (UTI) is an infection in your urinary tract. The urinary tract is made up of organs that make, store, and get rid of pee (urine) in your body. These organs include: The kidneys. The ureters. The bladder. The urethra. What are the causes? Most UTIs are caused by germs called bacteria. They  may be in or near your genitals. These germs grow and cause swelling in your urinary tract. What increases the risk? You're more likely to get a UTI if: You're a female. The urethra is shorter in females than in males. You have a soft tube called a catheter that drains your pee. You can't control when you pee or poop. You have trouble peeing because of: A kidney stone. A urinary blockage. A nerve condition that affects your bladder. Not getting enough to drink. You're sexually active. You use a birth control inside your vagina, like  spermicide. You're pregnant. You have low levels of the hormone estrogen in your body. You're an older adult. You're also more likely to get a UTI if you have other health problems. These may include: Diabetes. A weak immune system. Your immune system is your body's defense system. Sickle cell disease. Injury of the spine. What are the signs or symptoms? Symptoms may include: Needing to pee right away. Peeing small amounts often. Pain or burning when you pee. Blood in your pee. Pee that smells bad or odd. Pain in your belly or lower back. You may also: Feel confused. This may be the first symptom in older adults. Vomit. Not feel hungry. Feel tired or easily annoyed. Have a fever or chills. How is this diagnosed? A UTI is diagnosed based on your medical history and an exam. You may also have other tests. These may include: Pee tests. Blood tests. Tests for sexually transmitted infections (STIs). If you've had more than one UTI, you may need to have imaging studies done to find out why you keep getting them. How is this treated? A UTI can be treated by: Taking antibiotics or other medicines. Drinking enough fluid to keep your pee pale yellow. In rare cases, a UTI can cause a very bad condition called sepsis. Sepsis may be treated in the hospital. Follow these instructions at home: Medicines Take your medicines only as told by your health care provider. If you were given antibiotics, take them as told by your provider. Do not stop taking them even if you start to feel better. General instructions Make sure you: Pee often and fully. Do not hold your pee for a long time. Wipe from front to back after you pee or poop. Use each tissue only once when you wipe. Pee after you have sex. Do not douche or use sprays or powders in your genital area. Contact a health care provider if: Your symptoms don't get better after 1-2 days of taking antibiotics. Your symptoms go away and then  come back. You have a fever or chills. You vomit or feel like you may vomit. Get help right away if: You have very bad pain in your back or lower belly. You faint. This information is not intended to replace advice given to you by your health care provider. Make sure you discuss any questions you have with your health care provider. Document Revised: 01/04/2023 Document Reviewed: 04/29/2022 Elsevier Patient Education  [employer].   If you have been instructed to have an in-person evaluation today at a local Urgent Care facility, please use the link below. It will take you to a list of all of our available [facility], including address, phone number and hours of operation. Please do not delay care.  [facility]  If you or a family member do not have a primary care provider, use the link below to schedule a visit and establish  care. When you choose a [facility] primary care physician or advanced practice provider, you gain a long-term partner in health. Find a Primary Care Provider  Learn more about [facility]'s in-office and virtual care options: [facility] Get Care Now

## 2023-12-30 NOTE — Progress Notes (Signed)
 Virtual Visit Consent   Sheri Wood, you are scheduled for a virtual visit with a [facility] provider today. Just as with appointments in the office, your consent must be obtained to participate. Your consent will be active for this visit and any virtual visit you may have with one of our providers in the next 365 days. If you have a MyChart account, a copy of this consent can be sent to you electronically.  As this is a virtual visit, video technology does not allow for your provider to perform a traditional examination. This may limit your provider's ability to fully assess your condition. If your provider identifies any concerns that need to be evaluated in person or the need to arrange testing (such as labs, EKG, etc.), we will make arrangements to do so. Although advances in technology are sophisticated, we cannot ensure that it will always work on either your end or our end. If the connection with a video visit is poor, the visit may have to be switched to a telephone visit. With either a video or telephone visit, we are not always able to ensure that we have a secure connection.  By engaging in this virtual visit, you consent to the provision of healthcare and authorize for your insurance to be billed (if applicable) for the services provided during this visit. Depending on your insurance coverage, you may receive a charge related to this service.  I need to obtain your verbal consent now. Are you willing to proceed with your visit today? Sheri Wood has provided verbal consent on 12/30/2023 for a virtual visit (video or telephone). Roosvelt Mater, [place]  Date: 12/30/2023 6:18 PM   Virtual Visit via Video Note   I, Roosvelt Mater, connected with  Sheri Wood  ([id number], 03/28/1970) on 12/30/23 at  6:00 PM EST by a video-enabled telemedicine application and verified that I am speaking with the correct person using two identifiers.  Location: Patient: Virtual Visit Location  Patient: Home Provider: Virtual Visit Location Provider: Home Office   I discussed the limitations of evaluation and management by telemedicine and the availability of in person appointments. The patient expressed understanding and agreed to proceed.    History of Present Illness: Sheri Wood is a 53 y.o. who identifies as a female who was assigned female at birth, and is being seen today for c/o UTI symptoms.  Pt was prescribed Macrobid  in October and symptoms came back.  On Nov 13th she had an office visit and had a culture on Nov 18th and she was prescribed bactrim  but she is till experiencing symptoms. Pt states she has a copy of her urine culture and is noted to be susceptible to several antibiotics including Macrobid  and Bactrim  which the Pt has already take,  Unfortunately the Pt is allergic to Augmentin , Ciprofloxacin  and Cefzil.  HPI: HPI  Problems:  Patient Active Problem List   Diagnosis Date Noted   Calculus of gallbladder without cholecystitis without obstruction 03/31/2020   Binge eating disorder 06/30/2016   Morbid obesity due to excess calories (HCC) 06/17/2015   Restless leg syndrome 06/08/2013   Fibromyalgia 10/12/2012   Migraine headache without aura 09/17/2012   Depression with anxiety 09/17/2012    Allergies:  Allergies  Allergen Reactions   Ciprofloxacin  Other (See Comments)    Severe muscle and joint pain   Cefzil [Cefprozil]     possible hives   Augmentin  [Amoxicillin -Pot Clavulanate] Nausea And Vomiting    Throat felt funny and  gave pt anxiety   Bee Venom Swelling   Medications:  Current Outpatient Medications:    tetracycline  (SUMYCIN ) 500 MG capsule, Take 1 capsule (500 mg total) by mouth 4 (four) times daily for 5 days., Disp: 20 capsule, Rfl: 0   albuterol  (VENTOLIN  HFA) 108 (90 Base) MCG/ACT inhaler, Inhale 2 puffs into the lungs every 6 (six) hours as needed for wheezing or shortness of breath., Disp: 8 g, Rfl: 0   buPROPion  (WELLBUTRIN  SR) 150  MG 12 hr tablet, Take 1 tablet (150 mg total) by mouth 2 (two) times daily., Disp: 60 tablet, Rfl: 5   diazepam  (VALIUM ) 5 MG tablet, Take 1 tablet (5 mg total) by mouth 2 (two) times daily as needed., Disp: 60 tablet, Rfl: 2   eletriptan  (RELPAX ) 40 MG tablet, Take 1 tablet (40 mg total) by mouth every 2 (two) hours as needed for migraine (May repeat in 2 hours if headache persists or recurs.)., Disp: 10 tablet, Rfl: 0   fluconazole  (DIFLUCAN ) 150 MG tablet, Take 1 tablet (150 mg total) by mouth every 3 (three) days as needed., Disp: 2 tablet, Rfl: 0   FLUoxetine  (PROZAC ) 40 MG capsule, Take 1 capsule (40 mg total) by mouth daily., Disp: 30 capsule, Rfl: 2   lisdexamfetamine (VYVANSE ) 70 MG capsule, Take 1 capsule (70 mg total) by mouth daily., Disp: 30 capsule, Rfl: 0   lisdexamfetamine (VYVANSE ) 70 MG capsule, Take 1 capsule (70 mg total) by mouth daily., Disp: 30 capsule, Rfl: 0   lisdexamfetamine (VYVANSE ) 70 MG capsule, Take 1 capsule (70 mg total) by mouth daily., Disp: 30 capsule, Rfl: 0   nitrofurantoin , macrocrystal-monohydrate, (MACROBID ) 100 MG capsule, Take 1 capsule (100 mg total) by mouth 2 (two) times daily., Disp: 10 capsule, Rfl: 0   tretinoin  (RETIN-A ) 0.025 % cream, Apply topically at bedtime. Apply a pea size amount twice a week. If tolerated well, after two weeks, apply three times a week, Disp: 45 g, Rfl: 2  Observations/Objective: Patient is well-developed, well-nourished in no acute distress.  Resting comfortably at home.  Head is normocephalic, atraumatic.  No labored breathing.  Speech is clear and coherent with logical content.  Patient is alert and oriented at baseline.    Assessment and Plan: 1. Suspected UTI (Primary) - tetracycline  (SUMYCIN ) 500 MG capsule; Take 1 capsule (500 mg total) by mouth 4 (four) times daily for 5 days.  Dispense: 20 capsule; Refill: 0  -Tetracycline  was on the list of susceptible antibiotics and therefore the only one we could choose  from -Pt was advised if no improvement to seek medical attention in person at the hospital and to also notify PCP office -Pt verbalized understanding.   Follow Up Instructions: I discussed the assessment and treatment plan with the patient. The patient was provided an opportunity to ask questions and all were answered. The patient agreed with the plan and demonstrated an understanding of the instructions.  A copy of instructions were sent to the patient via MyChart unless otherwise noted below.     The patient was advised to call back or seek an in-person evaluation if the symptoms worsen or if the condition fails to improve as anticipated.    Roosvelt Mater, PA-C

## 2024-01-25 ENCOUNTER — Other Ambulatory Visit (HOSPITAL_COMMUNITY): Payer: Self-pay | Admitting: Gastroenterology

## 2024-01-25 DIAGNOSIS — K760 Fatty (change of) liver, not elsewhere classified: Secondary | ICD-10-CM

## 2024-01-29 ENCOUNTER — Ambulatory Visit: Admitting: Pulmonary Disease

## 2024-01-29 ENCOUNTER — Ambulatory Visit (HOSPITAL_COMMUNITY)
Admission: RE | Admit: 2024-01-29 | Discharge: 2024-01-29 | Disposition: A | Discharge status: Home / Self Care | Source: Ambulatory Visit | Attending: Gastroenterology | Admitting: Gastroenterology

## 2024-01-29 DIAGNOSIS — K760 Fatty (change of) liver, not elsewhere classified: Secondary | ICD-10-CM | POA: Diagnosis present

## 2024-02-09 ENCOUNTER — Telehealth (HOSPITAL_COMMUNITY): Payer: Self-pay | Admitting: *Deleted

## 2024-02-09 NOTE — Telephone Encounter (Signed)
 Patient called stating she is needing refills for her Vyvanse  to be sent to [facility].

## 2024-02-12 ENCOUNTER — Other Ambulatory Visit (HOSPITAL_COMMUNITY): Payer: Self-pay | Admitting: Psychiatry

## 2024-02-12 MED ORDER — LISDEXAMFETAMINE DIMESYLATE 70 MG PO CAPS: 70.0000 mg | ORAL_CAPSULE | Freq: Every day | ORAL | 0 refills | Status: AC

## 2024-02-12 NOTE — Telephone Encounter (Signed)
Sent, needs appt

## 2024-02-24 ENCOUNTER — Telehealth: Admitting: Family Medicine

## 2024-02-24 DIAGNOSIS — B379 Candidiasis, unspecified: Secondary | ICD-10-CM

## 2024-02-24 DIAGNOSIS — T3695XA Adverse effect of unspecified systemic antibiotic, initial encounter: Secondary | ICD-10-CM

## 2024-02-24 DIAGNOSIS — R399 Unspecified symptoms and signs involving the genitourinary system: Secondary | ICD-10-CM

## 2024-02-24 MED ORDER — SULFAMETHOXAZOLE-TRIMETHOPRIM 800-160 MG PO TABS: 1.0000 | ORAL_TABLET | Freq: Two times a day (BID) | ORAL | 0 refills | Status: AC

## 2024-02-24 MED ORDER — FLUCONAZOLE 150 MG PO TABS: 150.0000 mg | ORAL_TABLET | ORAL | 0 refills | Status: AC | PRN

## 2024-02-24 NOTE — Progress Notes (Signed)
 We are sorry that you are not feeling well.  Here is how we plan to help!  Based on what you shared with me it looks like you most likely have a simple urinary tract infection.  A UTI (Urinary Tract Infection) is a bacterial infection of the bladder.  Most cases of urinary tract infections are simple to treat but a key part of your care is to encourage you to drink plenty of fluids and watch your symptoms carefully.  I have prescribed Bactrim  DS One tablet twice a day for 5 days.  And fluconazole  150 mg for possible yeast. Your symptoms should gradually improve. Call us  if the burning in your urine worsens, you develop worsening fever, back pain or pelvic pain or if your symptoms do not resolve after completing the antibiotic.  Urinary tract infections can be prevented by drinking plenty of water to keep your body hydrated.  Also be sure when you wipe, wipe from front to back and don't hold it in!  If possible, empty your bladder every 4 hours.  Your e-visit answers were reviewed by a board certified advanced clinical practitioner to complete your personal care plan.  Depending on the condition, your plan could have included both over the counter or prescription medications.  If there is a problem please reply  once you have received a response from your provider.  Your safety is important to us .  If you have drug allergies check your prescription carefully.    You can use MyChart to ask questions about todays visit, request a non-urgent call back, or ask for a work or school excuse for 24 hours related to this e-Visit. If it has been greater than 24 hours you will need to follow up with your provider, or enter a new e-Visit to address those concerns.   You will get an e-mail in the next two days asking about your experience.  I hope that your e-visit has been valuable and will speed your recovery. Thank you for using e-visits.  I have spent 5 minutes in review of e-visit questionnaire,  review and updating patient chart, medical decision making and response to patient.   Roosvelt Mater, PA-C

## 2024-03-04 ENCOUNTER — Ambulatory Visit: Admitting: Urology

## 2024-03-06 ENCOUNTER — Ambulatory Visit: Admitting: Dermatology

## 2024-03-13 ENCOUNTER — Ambulatory Visit: Admitting: Pulmonary Disease

## 2024-05-14 ENCOUNTER — Ambulatory Visit: Admitting: Pulmonary Disease
